# Patient Record
Sex: Male | Born: 1945 | Race: White | Hispanic: No | Marital: Married | Smoking: Never smoker
Health system: Southern US, Community
[De-identification: ages and names within clinical notes are randomized; demographics above are authoritative.]

## PROBLEM LIST (undated history)

## (undated) DIAGNOSIS — F341 Dysthymic disorder: Secondary | ICD-10-CM

## (undated) DIAGNOSIS — R7303 Prediabetes: Secondary | ICD-10-CM

## (undated) DIAGNOSIS — H548 Legal blindness, as defined in USA: Secondary | ICD-10-CM

## (undated) DIAGNOSIS — I10 Essential (primary) hypertension: Secondary | ICD-10-CM

## (undated) DIAGNOSIS — H5440 Blindness, one eye, unspecified eye: Secondary | ICD-10-CM

## (undated) DIAGNOSIS — Z86010 Personal history of colonic polyps: Secondary | ICD-10-CM

## (undated) DIAGNOSIS — I639 Cerebral infarction, unspecified: Secondary | ICD-10-CM

## (undated) DIAGNOSIS — M1990 Unspecified osteoarthritis, unspecified site: Secondary | ICD-10-CM

## (undated) DIAGNOSIS — E785 Hyperlipidemia, unspecified: Secondary | ICD-10-CM

## (undated) HISTORY — DX: Unspecified osteoarthritis, unspecified site: M19.90

## (undated) HISTORY — DX: Essential (primary) hypertension: I10

## (undated) HISTORY — DX: Hyperlipidemia, unspecified: E78.5

## (undated) HISTORY — DX: Blindness, one eye, unspecified eye: H54.40

## (undated) HISTORY — DX: Cerebral infarction, unspecified: I63.9

## (undated) HISTORY — DX: Personal history of colonic polyps: Z86.010

---

## 1986-01-28 HISTORY — PX: CORNEAL TRANSPLANT: SHX108

## 2005-02-20 ENCOUNTER — Inpatient Hospital Stay (HOSPITAL_COMMUNITY): Admission: RE | Admit: 2005-02-20 | Discharge: 2005-02-26 | Payer: Self-pay | Admitting: Orthopedic Surgery

## 2005-02-28 HISTORY — PX: REPLACEMENT TOTAL KNEE: SUR1224

## 2005-04-10 ENCOUNTER — Ambulatory Visit: Payer: Self-pay | Admitting: Family Medicine

## 2005-04-24 ENCOUNTER — Ambulatory Visit: Payer: Self-pay | Admitting: Family Medicine

## 2005-05-01 ENCOUNTER — Ambulatory Visit: Payer: Self-pay | Admitting: Gastroenterology

## 2005-05-06 ENCOUNTER — Ambulatory Visit: Payer: Self-pay | Admitting: Cardiology

## 2005-05-15 ENCOUNTER — Encounter (INDEPENDENT_AMBULATORY_CARE_PROVIDER_SITE_OTHER): Payer: Self-pay | Admitting: Specialist

## 2005-05-15 ENCOUNTER — Ambulatory Visit: Payer: Self-pay | Admitting: Gastroenterology

## 2005-05-15 DIAGNOSIS — Z860100 Personal history of colon polyps, unspecified: Secondary | ICD-10-CM

## 2005-05-15 DIAGNOSIS — D126 Benign neoplasm of colon, unspecified: Secondary | ICD-10-CM | POA: Insufficient documentation

## 2005-05-15 HISTORY — DX: Personal history of colonic polyps: Z86.010

## 2005-05-15 HISTORY — DX: Personal history of colon polyps, unspecified: Z86.0100

## 2005-05-17 ENCOUNTER — Ambulatory Visit: Payer: Self-pay | Admitting: Gastroenterology

## 2005-05-24 ENCOUNTER — Ambulatory Visit: Payer: Self-pay | Admitting: Gastroenterology

## 2005-05-24 ENCOUNTER — Encounter (INDEPENDENT_AMBULATORY_CARE_PROVIDER_SITE_OTHER): Payer: Self-pay | Admitting: *Deleted

## 2005-05-24 HISTORY — PX: ESOPHAGOGASTRODUODENOSCOPY: SHX1529

## 2005-06-14 ENCOUNTER — Ambulatory Visit: Payer: Self-pay | Admitting: Internal Medicine

## 2005-07-26 ENCOUNTER — Ambulatory Visit: Payer: Self-pay | Admitting: Internal Medicine

## 2005-11-13 ENCOUNTER — Ambulatory Visit: Payer: Self-pay | Admitting: Internal Medicine

## 2005-11-27 ENCOUNTER — Ambulatory Visit: Payer: Self-pay | Admitting: Family Medicine

## 2005-11-28 ENCOUNTER — Ambulatory Visit: Payer: Self-pay | Admitting: Family Medicine

## 2006-04-14 ENCOUNTER — Ambulatory Visit: Payer: Self-pay | Admitting: Internal Medicine

## 2006-05-12 ENCOUNTER — Encounter: Payer: Self-pay | Admitting: Family Medicine

## 2006-05-13 DIAGNOSIS — I10 Essential (primary) hypertension: Secondary | ICD-10-CM | POA: Insufficient documentation

## 2006-09-08 ENCOUNTER — Ambulatory Visit: Payer: Self-pay | Admitting: Gastroenterology

## 2006-12-17 ENCOUNTER — Telehealth: Payer: Self-pay | Admitting: Family Medicine

## 2006-12-31 ENCOUNTER — Ambulatory Visit: Payer: Self-pay | Admitting: Family Medicine

## 2007-05-01 ENCOUNTER — Ambulatory Visit: Payer: Self-pay | Admitting: Family Medicine

## 2007-05-01 LAB — CONVERTED CEMR LAB
AST: 17 units/L (ref 0–37)
Alkaline Phosphatase: 47 units/L (ref 39–117)
Calcium: 9.4 mg/dL (ref 8.4–10.5)
Chloride: 107 meq/L (ref 96–112)
GFR calc Af Amer: 97 mL/min
GFR calc non Af Amer: 80 mL/min
Glucose, Bld: 105 mg/dL — ABNORMAL HIGH (ref 70–99)
HDL: 39 mg/dL (ref >39.0)
LDL Cholesterol: 117 mg/dL — ABNORMAL HIGH (ref 0–99)
PSA: 2.25 ng/mL (ref 0.10–4.00)
Total Bilirubin: 1.3 mg/dL — ABNORMAL HIGH (ref 0.3–1.2)
Triglycerides: 124 mg/dL (ref 0–149)
VLDL: 25 mg/dL (ref 0–40)

## 2007-05-05 ENCOUNTER — Ambulatory Visit: Payer: Self-pay | Admitting: Family Medicine

## 2007-05-05 DIAGNOSIS — L259 Unspecified contact dermatitis, unspecified cause: Secondary | ICD-10-CM | POA: Insufficient documentation

## 2008-03-01 ENCOUNTER — Telehealth: Payer: Self-pay | Admitting: Family Medicine

## 2009-01-24 ENCOUNTER — Telehealth: Payer: Self-pay | Admitting: Family Medicine

## 2009-03-01 ENCOUNTER — Ambulatory Visit: Payer: Self-pay | Admitting: Family Medicine

## 2009-05-04 ENCOUNTER — Ambulatory Visit: Payer: Self-pay | Admitting: Family Medicine

## 2009-05-04 DIAGNOSIS — E7800 Pure hypercholesterolemia, unspecified: Secondary | ICD-10-CM | POA: Insufficient documentation

## 2009-05-04 LAB — CONVERTED CEMR LAB
ALT: 24 units/L (ref 0–53)
Albumin: 4.6 g/dL (ref 3.5–5.2)
CO2: 31 meq/L (ref 19–32)
Calcium: 9.6 mg/dL (ref 8.4–10.5)
Chloride: 102 meq/L (ref 96–112)
Cholesterol: 186 mg/dL (ref 0–200)
Glucose, Bld: 106 mg/dL — ABNORMAL HIGH (ref 70–99)
LDL Cholesterol: 124 mg/dL — ABNORMAL HIGH (ref 0–99)
Triglycerides: 91 mg/dL (ref 0.0–149.0)

## 2009-05-16 ENCOUNTER — Ambulatory Visit: Payer: Self-pay | Admitting: Family Medicine

## 2009-09-05 ENCOUNTER — Encounter (INDEPENDENT_AMBULATORY_CARE_PROVIDER_SITE_OTHER): Payer: Self-pay | Admitting: *Deleted

## 2010-02-27 NOTE — Assessment & Plan Note (Signed)
Summary: REFILL MEDICATION/CLE   Vital Signs:  Patient profile:   65 year old male Height:      73 inches Weight:      252 pounds BMI:     33.37 Temp:     98.6 degrees F oral Pulse rate:   76 / minute Pulse rhythm:   regular BP sitting:   122 / 78  (left arm) Cuff size:   large  Vitals Entered By: Sydell Axon LPN (March 01, 2009 8:51 AM) CC: Refill medications   History of Present Illness: Pt here for followup at my encouragement for Htn and Genella Rife. he has no complaints and feels well.  He "Gets exercise" playing golf, carries his bag from the car to the golfcart!  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 Take 1 By Mouth Once Daily 2)  Omeprazole 20 Mg  Cpdr (Omeprazole) .Marland Kitchen.. 1 Q D  Allergies: No Known Drug Allergies  Physical Exam  General:  Well-developed,well-nourished,in no acute distress; alert,appropriate and cooperative throughout examination. Head:  Normocephalic and atraumatic without obvious abnormalities. No apparent alopecia or balding. Sinuses NT. Eyes:  Conjunctiva clear bilaterally.  Ears:  External ear exam shows no significant lesions or deformities.  Otoscopic examination reveals clear canals, tympanic membranes are intact bilaterally without bulging, retraction, inflammation or discharge. Hearing is grossly normal bilaterally. Nose:  External nasal examination shows no deformity or inflammation. Nasal mucosa are pink and moist without lesions or exudates. Mouth:  Oral mucosa and oropharynx without lesions or exudates.  Teeth in good repair. Neck:  No deformities, masses, or tenderness noted. Chest Wall:  No  deformities, masses, tenderness or gynecomastia noted. Lungs:  Normal respiratory effort, chest expands symmetrically. Lungs are clear to auscultation, no crackles or wheezes. Heart:  Normal rate and regular rhythm. S1 and S2 normal without gallop, murmur, click, rub or other extra sounds.   Impression & Recommendations:  Problem # 1:  HYPERTENSION (ICD-401.9) Assessment Unchanged Stable. Cont Quinapril. His updated medication list for this problem includes:    Quinapril Hcl 40 Mg Tabs (Quinapril hcl) .Marland Kitchen... Take 1 by mouth once daily  BP today: 122/78 Prior BP: 120/80 (05/05/2007)  Labs Reviewed: K+: 4.3 (05/01/2007) Creat: : 1.0 (05/01/2007)   Chol: 181 (05/01/2007)   HDL: 39.0 (05/01/2007)   LDL: 117 (05/01/2007)   TG: 124 (05/01/2007)  Problem # 2:  HEARTBURN (ICD-787.1) Assessment: Unchanged Uses Omeprazole. Will explore this next time.  Complete Medication List: 1)  Quinapril Hcl 40 Mg Tabs (Quinapril hcl) .... Take 1 by mouth once daily 2)  Omeprazole 20 Mg Cpdr (Omeprazole) .... Take one by mouth daily  Patient Instructions: 1)  RTC for Comp Exam when able, labs prior. Prescriptions: OMEPRAZOLE 20 MG  CPDR (OMEPRAZOLE) Take one by mouth daily  #30 x 12   Entered by:   Sydell Axon LPN   Authorized by:   Shaune Leeks MD   Signed by:   Sydell Axon LPN on 40/98/1191   Method used:   Print then Give to Patient   RxID:   [id number] QUINAPRIL HCL 40 MG  TABS (QUINAPRIL HCL) take 1 by mouth once daily  #  30 x 12   Entered by:   Sydell Axon LPN   Authorized by:   Shaune Leeks MD   Signed by:   Sydell Axon LPN on 16/10/9602   Method used:   Print then Give to Patient   RxID:   [id number]   Current Allergies (reviewed today): No known allergies

## 2010-02-27 NOTE — Letter (Signed)
Summary: Nadara Eaton letter  [place] [facility]  [address] [place], [place] [address]   Phone: [phone]  Fax: [phone]       09/05/2009 MRN: [id number]  PRATHIK AMAN [address] [place] [place], [place]  [address]  Dear Mr. [facility] [place], and [facility] announce the retirement of Arta Silence, M.D., from full-time practice at the [facility] office effective July 27, 2009 and his plans of returning part-time.  It is important to Dr. Hetty Ely and to our practice that you understand that [facility] has seven physicians in our office for your health care needs.  We will continue to offer the same exceptional care that you have today.    Dr. Hetty Ely has spoken to many of you about his plans for retirement and returning part-time in the fall.   We will continue to work with you through the transition to schedule appointments for you in the office and meet the high standards that [facility] is committed to.   Again, it is with great pleasure that we share the news that Dr. Hetty Ely will return to [facility] in October of 2011 with a reduced schedule.    If you have any questions, or would like to request an appointment with one of our physicians, please call us at [phone] and press the option for Scheduling an appointment.  We take pleasure in providing you with excellent patient care and look forward to seeing you at your next office visit.  Our [facility] Physicians are:  Tillman Abide, M.D. Laurita Quint, M.D. Roxy Manns, M.D. Kerby Nora, M.D. Hannah Beat, M.D. Ruthe Mannan, M.D. We proudly welcomed Raechel Ache, M.D. and Eustaquio Boyden, M.D. to the practice in July/August 2011.  Sincerely,  [place] [facility]

## 2010-02-27 NOTE — Assessment & Plan Note (Signed)
Summary: CPX/CLE   Vital Signs:  Patient profile:   65 year old male Weight:      249 pounds Temp:     98.5 degrees F oral Pulse rate:   76 / minute Pulse rhythm:   regular BP sitting:   118 / 80  (left arm) Cuff size:   large  Vitals Entered By: Sydell Axon LPN (May 16, 2009 2:33 PM) CC: 30 Minute checkup, patient states that he has had a colonoscopy in the last 5 years   History of Present Illness: Pt here for Comp Exam. He has chronic knee problems and has had polyps, due colonoscopy again next year.  Preventive Screening-Counseling & Management  Alcohol-Tobacco     Alcohol drinks/day: 0     Smoking Status: never     Passive Smoke Exposure: no  Caffeine-Diet-Exercise     Caffeine use/day: 4     Does Patient Exercise: yes     Type of exercise: golf, fishing...walks a lot     Times/week: 4  Problems Prior to Update: 1)  Hypercholesterolemia  (ICD-272.0) 2)  Heartburn  (ICD-787.1) 3)  Lesion, Scalp  (ICD-238.2) 4)  Eczema  (ICD-692.9) 5)  Health Maintenance Exam  (ICD-V70.0) 6)  Special Screening Malignant Neoplasm of Prostate  (ICD-V76.44) 7)  Health Maintenance Exam  (ICD-V70.0) 8)  Colonic Polyps, Adenomatous  (ICD-211.3) 9)  Hypertension  (ICD-401.9)  Medications Prior to Update: 1)  Quinapril Hcl 40 Mg  Tabs (Quinapril Hcl) .... Take 1 By Mouth Once Daily 2)  Omeprazole 20 Mg  Cpdr (Omeprazole) .... Take One By Mouth Daily  Allergies: No Known Drug Allergies  Past History:  Past Medical History: Last updated: 05/12/2006 Hypertension  Family History: Last updated: 05/16/2009 Father dec Glaucoma Mother dec 84 Male Ca Brother dec 30 Brain condition Brother A 32 Sister A 68   Social History: Last updated: 05/12/2006 Occupation: Sports Psychologist Married Never Smoked Occas chews Alcohol use-no Drug use-no  Risk Factors: Alcohol Use: 0 (05/16/2009) Caffeine Use: 4 (05/16/2009) Exercise: yes (05/16/2009)  Risk Factors: Smoking Status:  never (05/16/2009) Passive Smoke Exposure: no (05/16/2009)  Past Surgical History: Total right knee- osteoarthritis 2/07 Corneal transplant L 1988 EGD- mild erythema in antrum 05/24/2005 Colonoscopy + polyps  05/15/2005    2012 CT ABdD/Pelvis nml 04/2005  Family History: Father dec Glaucoma Mother dec 84 Male Ca Brother dec 30 Brain condition Brother A 80 Sister A 68   Review of Systems General:  Denies chills, fatigue, fever, sweats, weakness, and weight loss. Eyes:  Complains of blurring; denies discharge and itching; h/o cornea transplant. ENT:  Denies decreased hearing, earache, and ringing in ears. CV:  Denies chest pain or discomfort, fainting, fatigue, palpitations, shortness of breath with exertion, swelling of feet, and swelling of hands. Resp:  Denies cough, shortness of breath, and wheezing. GI:  Denies abdominal pain, bloody stools, change in bowel habits, constipation, dark tarry stools, diarrhea, indigestion, loss of appetite, nausea, vomiting, vomiting blood, and yellowish skin color. GU:  Complains of nocturia; denies discharge, dysuria, and urinary frequency; once to twice. Derm:  Denies dryness, itching, and rash; sees dermatologist. Neuro:  Denies numbness, poor balance, tingling, and tremors.  Physical Exam  General:  Well-developed,well-nourished,in no acute distress; alert,appropriate and cooperative throughout examination. Head:  Normocephalic and atraumatic without obvious abnormalities. No apparent alopecia, mild male pattern  balding. Sinuses NT. Eyes:  Conjunctiva clear bilaterally.  Ears:  External ear exam shows no significant lesions or deformities.  Otoscopic examination reveals clear  canals, tympanic membranes are intact bilaterally without bulging, retraction, inflammation or discharge. Hearing is grossly normal bilaterally. Nose:  External nasal examination shows no deformity or inflammation. Nasal mucosa are pink and moist without lesions or  exudates. Mouth:  Oral mucosa and oropharynx without lesions or exudates.  Teeth in good repair. Neck:  No deformities, masses, or tenderness noted. Chest Wall:  No deformities, masses, tenderness or gynecomastia noted. Breasts:  No masses or gynecomastia noted Lungs:  Normal respiratory effort, chest expands symmetrically. Lungs are clear to auscultation, no crackles or wheezes. Heart:  Normal rate and regular rhythm. S1 and S2 normal without gallop, murmur, click, rub or other extra sounds. Abdomen:  Bowel sounds positive,abdomen soft and non-tender without masses, organomegaly or hernias noted. Rectal:  No external abnormalities noted. Normal sphincter tone. No rectal masses or tenderness. G neg. Genitalia:  Testes bilaterally descended without nodularity, tenderness or masses. No scrotal masses or lesions. No penis lesions or urethral discharge. Prostate:  Prostate gland firm and smooth, no enlargement, nodularity, tenderness, mass, asymmetry or induration. 30-40 gms. Msk:  No deformity or scoliosis noted of thoracic or lumbar spine.   Pulses:  R and L carotid,radial,femoral,dorsalis pedis and posterior tibial pulses are full and equal bilaterally Extremities:  No clubbing, cyanosis, edema, or deformity noted with normal full range of motion of all joints.   Neurologic:  No cranial nerve deficits noted. Station and gait are normal. Sensory, motor and coordinative functions appear intact. Skin:  Intact without suspicious lesions or rashes, some SKs and Aks of trunk and head. Cervical Nodes:  No lymphadenopathy noted Inguinal Nodes:  No significant adenopathy Psych:  Cognition and judgment appear intact. Alert and cooperative with normal attention span and concentration. No apparent delusions, illusions, hallucinations   Impression & Recommendations:  Problem # 1:  HEALTH MAINTENANCE EXAM (ICD-V70.0)  Problem # 2:  HYPERCHOLESTEROLEMIA (ICD-272.0)  Problem # 3:  ECZEMA  (ICD-692.9) Assessment: Unchanged  Stable...sees Derm.  Discussed avoidance of triggers and symptomatic treatment.   Problem # 4:  SPECIAL SCREENING MALIGNANT NEOPLASM OF PROSTATE (ICD-V76.44) Assessment: Unchanged Stable PSA and exam.  Problem # 5:  COLONIC POLYPS, ADENOMATOUS (ICD-211.3) Assessment: Unchanged Rectal exam nml and due colonoscpy next year.  Problem # 6:  HYPERTENSION (ICD-401.9) Assessment: Unchanged Stable, cont curr meds. His updated medication list for this problem includes:    Quinapril Hcl 40 Mg Tabs (Quinapril hcl) .Marland Kitchen... Take 1 by mouth once daily  BP today: 118/80 Prior BP: 122/78 (03/01/2009)  Labs Reviewed: K+: 4.4 (05/04/2009) Creat: : 0.9 (05/04/2009)   Chol: 186 (05/04/2009)   HDL: 43.70 (05/04/2009)   LDL: 124 (05/04/2009)   TG: 91.0 (05/04/2009)  Complete Medication List: 1)  Quinapril Hcl 40 Mg Tabs (Quinapril hcl) .... Take 1 by mouth once daily 2)  Omeprazole 20 Mg Cpdr (Omeprazole) .... Take one by mouth daily 3)  Tylenol Pm Extra Strength 500-25 Mg Tabs (Diphenhydramine-apap (sleep)) .... As needed  Patient Instructions: 1)  RTC one year or  as needed.  Current Allergies (reviewed today): No known allergies   Appended Document: CPX/CLE   Tetanus/Td Vaccine    Vaccine Type: Tdap    Site: left deltoid    Mfr: GlaxoSmithKline    Dose: 0.5 ml    Route: IM    Given by: Sydell Axon LPN    Exp. Date: 04/22/2011    Lot #: [id number]    VIS given: 12/16/06 version given May 16, 2009.   Appended Document: CPX/CLE Discussed immunizations, healthy  lifestyle, sleeping and eating concerns.

## 2010-06-12 NOTE — Assessment & Plan Note (Signed)
[facility]                         GASTROENTEROLOGY OFFICE NOTE   Evan Morales, NUTTALL                      MRN:          [id number]  DATE:09/08/2006                            DOB:          23-Nov-1945    GI FOLLOWUP NOTE   PRIMARY CARE PHYSICIAN:  Barbette Hair. Artist Pais, D.O.   GI PROBLEM LIST:  1. History of colonic tubular adenomas.  Small TA removed by      colonoscopy 2004, 2007, he is set up for a repeat colonoscopy for      2012.  2. Vague dyspeptic symptoms.  EGD April 2007, very mild erythema,      otherwise normal.  Biopsies for sprue were negative.  CT scan of      abdomen and pelvis was normal.  CLO-testing was negative.  Symptoms      improved.   INTERVAL HISTORY:  I last saw Drayce at the time of his upper and lower  endoscopies about a year ago.  Since then, he really has felt fine.  He  did not take any antacid medicines any more.  He has been gaining  weight.  He has put on probably 20-30 pounds over this time.  He came in  at the suggestion of his wife because she has noticed he tends to have  some regurgitation about once a month.  He does acknowledge this and  says it is almost always after a particularly large meal.  His eating  habits are a bit unusual.  He will eat almost no breakfast or lunch, and  will have a large meal for dinner and snack a lot while watching  sporting events at night.  He does not drink alcohol.  He is not a  smoker.  He has no dysphagia.  No vomiting blood.  He has been gaining  weight.   CURRENT MEDICATIONS:  Quinapril is his only medicine.   PHYSICAL EXAM:  Weight 240 pounds, which is up 28 pounds since his last  visit a year ago.  Blood pressure 126/84, pulse 80.  CONSTITUTIONAL:  Generally well-appearing.  Alert and oriented x3.   ASSESSMENT AND PLAN:  A 65 year old man with intermittent regurgitation.   He is not very concerned about these symptoms.  He says they happen once  every month or 2.  He  has not been losing weight.  It does sound like he  eats fairly unusually (i.e., 1 very large meal a day and he says even  then the symptoms will only happen when he eats even more than his usual  1 large meal a day).  I recommended that he try eating 2 to 3 smaller  meals a day, and if that did not help, try simethicone taking 2 Gas-X  pills with a large meal.  He is  already in our reminder system for colonoscopy again in 2012.  I see no  reason for repeat endoscopy at this point.     Rachael Fee, MD  Electronically Signed    DPJ/MedQ  DD: 09/08/2006  DT: 09/09/2006  Job #: [id number]   cc:  Barbette Hair. Artist Pais, DO

## 2010-06-15 NOTE — Op Note (Signed)
NAME:  XAIDYN, KEPNER NO.:  [id number]   MEDICAL RECORD NO.:  [id number]          PATIENT TYPE:  INP   LOCATION:  2852                         FACILITY:  [facility]   PHYSICIAN:  Nadara Mustard, MD     DATE OF BIRTH:  1945/11/16   DATE OF PROCEDURE:  02/20/2005  DATE OF DISCHARGE:                                 OPERATIVE REPORT   PREOP DIAGNOSIS:  Osteoarthritis right knee.   POSTOP DIAGNOSIS:  Osteoarthritis right knee.   PROCEDURE:  Right total knee arthroplasty with #5 femur, #4 tibia, 12.5-mm  poly tray with a 41-mm patella.   SURGEON:  Nadara Mustard, MD   ANESTHESIA:  General.   ESTIMATED BLOOD LOSS:  Minimal.   ANTIBIOTICS:  1 gram of Kefzol.   DRAINS:  None.   COMPLICATIONS:  None.   TOURNIQUET TIME:  60 minutes at 300 mmHg at the thigh.   DISPOSITION:  To PACU in stable condition. Posterior capsule injected with  total of 60 mL of quarter percent Marcaine with epinephrine.   INDICATIONS FOR PROCEDURE:  The patient is a 65 year old gentleman with  tricompartmental osteoarthritis of his right knee and has failed  conservative care.  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 The risks  and benefits were discussed including infection, neurovascular injury,  persistent pain, need for additional surgery. The patient states he  understands and wished proceed at this time.   DESCRIPTION OF PROCEDURE:  The patient was brought to OR room 4 and  underwent general anesthetic. After adequate level of anesthesia obtained,  the patient's right lower extremity was prepped using DuraPrep, draped in a  sterile field. Collier Flowers was used to cover all exposed skin. The knee was flexed  and the tourniquet inflated 300 mmHg. An incision was made dorsally over the  knee. This was carried down through a medial parapatellar retinacular  incision. The patella was everted. A canal starting hole was made  in the  femur and intermedullary guide was placed and this was set to take 11 mm off  the distal femur. This sized for a size 5 and the size 5 cutting block was  placed and the chamfer cuts were made for the size 5 femur. Attention was  then focused on the tibia, external alignment was used with neutral varus  valgus alignment as well as neutral posterior slope and this was set to take  10 mm off the medial tibial plateau. The cut was made and then the tibia was  sized for a size 4 tibia and this was sized and the keel cuts were made and  the trial component was left on the tibia. The box cut was then made for the  size 5 femur and the femoral and tibial components trial were placed. This  was sized with a 10 and a 12.5 mm 12.5 mm had good stability and had full  extension. The keel cuts were made for the femur. The trial components  removed. The patella was resurfaced with 10 mm  taken off the patella and the  peg cuts were made for a size 41 patella. The knee was irrigated with pulse  lavage. The cement was mixed and the tibial and then femoral components were  cemented in place.  Loose cement was removed. The patella was cemented in  place and loose cement was also removed.  The clamp was placed on the  patella. The 12.5-mm poly tray was placed and the knee was kept in extension  until the cement had hardened. The knee was again irrigated with pulse  lavage while the cement was hardening all loose cement was removed. After  the cement had hardened, the knee was placed through full range of motion  and had good stability. The patella tracked midline. The tourniquet was  released and hemostasis was obtained. The medial parapatellar retinacular  incision was closed using #1 Vicryl.  Subcu was closed using 2-0 Vicryl.  Skin was closed using Proximate staples. The wound was covered with Adaptic  orthopedic sponges, ABD dressing, Webril and Coban dressing. Triple ice pack  was applied. The  patient was extubated, taken to PACU in stable condition.      Nadara Mustard, MD  Electronically Signed     MVD/MEDQ  D:  02/20/2005  T:  02/20/2005  Job:  [id number]

## 2010-06-15 NOTE — Discharge Summary (Signed)
NAME:  Evan Morales, STEAGALL NO.:  [id number]   MEDICAL RECORD NO.:  [id number]          PATIENT TYPE:  INP   LOCATION:  5030                         FACILITY:  [facility]   PHYSICIAN:  Nadara Mustard, MD     DATE OF BIRTH:  1945-11-21   DATE OF ADMISSION:  02/20/2005  DATE OF DISCHARGE:  02/26/2005                                 DISCHARGE SUMMARY   DIAGNOSIS:  Osteoarthritis right knee.   PROCEDURE:  Right total knee arthroplasty.   Discharged to home in stable condition with home health physical therapy,  home health R.N., durable medical equipment, plan to follow-up in the office  in one week. The patient discharged with Coumadin for DVT prophylaxis.   HISTORY OF PRESENT ILLNESS:  The patient is a 65 year old gentleman with  bilateral osteoarthritis of his knees, right worse than left. The patient  has failed conservative care, pain with activities of daily living and  presents at this time for total knee arthroplasty on the right. The patient  underwent a right total knee arthroplasty on February 20, 2005 with DePuy  components with a #5 femur, #4 tibia, 12.5 poly tray with a 41 mm patella.  The patient received Kefzol for infection prophylaxis and the tourniquet  time was 60 minutes. Postoperatively, the patient was kept on Keflex for 24  hours and was started on Coumadin for DVT prophylaxis. The patient was seen  by physical therapy and progressed well with physical therapy. His  hemoglobin remained stable. The patient was felt to be safe for discharge to  home by physical therapy and was discharged to home in stable condition on  February 26, 2005 for follow up in the office one week after discharge.      Nadara Mustard, MD  Electronically Signed     MVD/MEDQ  D:  04/09/2005  T:  04/10/2005  Job:  [id number]

## 2010-06-15 NOTE — Consult Note (Signed)
NAME:  Evan Morales, Evan Morales NO.:  [id number]   MEDICAL RECORD NO.:  [id number]          PATIENT TYPE:  INP   LOCATION:  5030                         FACILITY:  [facility]   PHYSICIAN:  Evan Morales, M.D.       DATE OF BIRTH:  1945-09-24   DATE OF CONSULTATION:  02/22/2005  DATE OF DISCHARGE:                                   CONSULTATION   REFERRING PHYSICIAN:  Burnard Bunting, M.D.   PRIMARY CARE PHYSICIAN:  Jonita Albee, M.D., [facility].   REASON FOR CONSULTATION:  Abdominal distention, nausea, and vomiting.   HISTORY OF PRESENT ILLNESS:  Evan Morales is a 65 year old Caucasian male  with no significant past medical history who was admitted no February 20, 2005, and underwent a right total knee replacement. He had significant pain  postoperatively and he was on morphine PCA pump. Soon after the surgery he  noted significant distention of his abdomen with inability to pass gas,  nausea, and vomiting. He denies any frank abdominal pain. He reports that  today actually he was able to pass some gas.  He did not have any bowel  movements since she has been here in the hospital.   PAST MEDICAL HISTORY:  1.  Hypertension.  2.  Cornea transplant.   HOME MEDICATIONS:  Aspirin 81 mg daily.   MEDICATIONS HERE IN THE HOSPITAL:  1.  Vitamin C.  2.  Colace 100 mg b.i.d.  3.  Iron sulfate 325 mg t.i.d.  4.  Lisinopril 40 mg daily.  5.  Coumadin 7.5 mg for DVT prophylaxis.   The patient was also on a morphine PC pump and on multiple p.r.n.'s   SOCIAL HISTORY:  The patient does not drink alcohol, does not smoke  cigarettes. He is newlywed, married two months ago. He has two grownup  children.   FAMILY HISTORY:  Both parents are deceased. Mother died with cancer and  father died with congestive heart failure. The patient has two siblings who  are both healthy.   REVIEW OF SYSTEMS:  Negative for chest pain, negative for dyspnea. Positive  for some dry  cough.  All other systems as per HPI. Other systems are  negative.   PHYSICAL EXAMINATION:  VITAL SIGNS: Temperature 98, pulse 97, respirations  20, Morales pressure 154/85. Saturation of 94% on room air.  GENERAL: The patient is well-developed, well-nourished, mildly obese, in no  acute distress, alert and oriented to person,  place, and time.  HEENT: His head is normocephalic and atraumatic. Pupils equal, round, and  reactive to light and accommodation. Extraocular movements are intact.  Sclera anicteric. Conjunctiva pink. Throat clear. Mouth without ulcerations.  NECK: Supple without JVD or carotid bruits.  CHEST: Clear to auscultation bilaterally, without rhonchi, wheezes, or  crackles.  COR:  Regular rate and rhythm without murmurs, rubs, or gallops.  ABDOMEN: Slightly distended, nontender. There are coarse bowel sounds. There  are no palpable masses.  EXTREMITIES: The right lower extremity is wrapped in tight bandages after  surgery. The left lower extremity has no edema.  SKIN: Without any suspicious rashes.  NEUROLOGIC: Cranial nerves II-XII are intact. Strength is 5/5 in both upper  extremities, 5/5 in the left lower extremity.   LABORATORY VALUES:  Sodium 136, potassium 3.5, chloride 102, bicarbonate 26,  BUN 14, creatinine 0.9, glucose 134, calcium 9.  The patient's white Morales  cell count is 11.8 and stable over the course of two days. The patient's  hemoglobin is 12, stable over the course of two days. Platelet count is  166,000. PT 17.7, INR 1.4.   An EKG shows possible old inferior MI. Portable chest x-ray reveals no acute  disease. Abdominal x-ray shows some cecal distention and some diffuse  colonic distention, ileus pattern. Of note, this is a preliminary reading,  final report is still pending.   IMPRESSION/RECOMMENDATIONS:  1.  Ileus with more prominent cecal distention. 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 Also need to rule out obstruction,  although I do      not suspect that given the clinical presentation. I agree with      discontinuing the PCA morphine. Will try some Toradol p.r.n. pain. Also      will start Reglan intravenously around the clock. Also will aggressively      supplement the potassium even though 3.5 appears to be normal. The      patient might need a bit more to overcome this ileus. Will place a      rectal tube and a follow-up with abdominal x-ray tomorrow to assure that      the cecum is not further distending. If the patient's cecum distends      further he will need a GI consultation for colonic decompression. Of      note this patient never had a colonoscopy and I strongly advised him to      have a colonoscopy even if he gets better here in the hospital and does      not require one.  2.  Hypertension. I do agree this patient will need treatment at the time of      discharge. For now we will hold the ACE inhibitors since we do not want      to complicate the clinical picture with the development of the acute      renal insufficiency from the ACE.  3.  Postoperative anemia. The patient's hemoglobin is stable for now and      there are no signs of acute bleeding. I      agree with oral iron therapy and just check every three days.  4.  Gastrointestinal prophylaxis. We added Protonix.  5.  Deep venous thrombosis prophylaxis. We added Coumadin.      Evan Morales, M.D.  Electronically Signed     SL/MEDQ  D:  02/22/2005  T:  02/22/2005  Job:  [id number]   cc:   G. Dorene Grebe, M.D.  Fax: [phone]   Nadara Mustard, MD  Fax: [phone]   Jonita Albee, M.D.  Fax: [phone]

## 2010-06-16 ENCOUNTER — Encounter: Payer: Self-pay | Admitting: Family Medicine

## 2010-07-02 ENCOUNTER — Other Ambulatory Visit (INDEPENDENT_AMBULATORY_CARE_PROVIDER_SITE_OTHER): Payer: Medicare Other | Admitting: Family Medicine

## 2010-07-02 DIAGNOSIS — I10 Essential (primary) hypertension: Secondary | ICD-10-CM

## 2010-07-02 DIAGNOSIS — Z125 Encounter for screening for malignant neoplasm of prostate: Secondary | ICD-10-CM

## 2010-07-02 DIAGNOSIS — Z86010 Personal history of colonic polyps: Secondary | ICD-10-CM

## 2010-07-02 DIAGNOSIS — E7800 Pure hypercholesterolemia, unspecified: Secondary | ICD-10-CM

## 2010-07-02 LAB — BASIC METABOLIC PANEL
BUN: 11 mg/dL (ref 6–23)
CO2: 28 mEq/L (ref 19–32)
Calcium: 9.3 mg/dL (ref 8.4–10.5)
Chloride: 104 mEq/L (ref 96–112)
Creatinine, Ser: 0.9 mg/dL (ref 0.4–1.5)
GFR: 89.85 mL/min (ref >60.00)
Glucose, Bld: 109 mg/dL — ABNORMAL HIGH (ref 70–99)
Potassium: 4.8 mEq/L (ref 3.5–5.1)
Sodium: 138 mEq/L (ref 135–145)

## 2010-07-02 LAB — HEPATIC FUNCTION PANEL
ALT: 20 U/L (ref 0–53)
Alkaline Phosphatase: 51 U/L (ref 39–117)
Total Bilirubin: 0.9 mg/dL (ref 0.3–1.2)

## 2010-07-02 LAB — LIPID PANEL
HDL: 51.7 mg/dL (ref >39.00)
Total CHOL/HDL Ratio: 4
Triglycerides: 118 mg/dL (ref 0.0–149.0)
VLDL: 23.6 mg/dL (ref 0.0–40.0)

## 2010-07-02 LAB — CBC WITH DIFFERENTIAL/PLATELET
Eosinophils Relative: 2.5 % (ref 0.0–5.0)
Lymphocytes Relative: 31.9 % (ref 12.0–46.0)
MCHC: 34.5 g/dL (ref 30.0–36.0)
MCV: 96.5 fl (ref 78.0–100.0)
Monocytes Relative: 7.4 % (ref 3.0–12.0)
RDW: 13.3 % (ref 11.5–14.6)

## 2010-07-02 LAB — TSH: TSH: 1.51 u[IU]/mL (ref 0.35–5.50)

## 2010-07-05 ENCOUNTER — Ambulatory Visit (INDEPENDENT_AMBULATORY_CARE_PROVIDER_SITE_OTHER): Payer: Medicare Other | Admitting: Family Medicine

## 2010-07-05 ENCOUNTER — Encounter: Payer: Self-pay | Admitting: Family Medicine

## 2010-07-05 DIAGNOSIS — Z0000 Encounter for general adult medical examination without abnormal findings: Secondary | ICD-10-CM

## 2010-07-05 DIAGNOSIS — R7303 Prediabetes: Secondary | ICD-10-CM | POA: Insufficient documentation

## 2010-07-05 DIAGNOSIS — R7309 Other abnormal glucose: Secondary | ICD-10-CM

## 2010-07-05 DIAGNOSIS — I10 Essential (primary) hypertension: Secondary | ICD-10-CM

## 2010-07-05 DIAGNOSIS — R739 Hyperglycemia, unspecified: Secondary | ICD-10-CM

## 2010-07-05 DIAGNOSIS — E7800 Pure hypercholesterolemia, unspecified: Secondary | ICD-10-CM

## 2010-07-05 DIAGNOSIS — D126 Benign neoplasm of colon, unspecified: Secondary | ICD-10-CM

## 2010-07-05 MED ORDER — HYDROCHLOROTHIAZIDE 12.5 MG PO CAPS: 12.5000 mg | ORAL_CAPSULE | Freq: Every day | ORAL | Status: DC

## 2010-07-05 NOTE — Assessment & Plan Note (Signed)
High. Will add HCTZ to curr med and recheck in future. BP Readings from Last 3 Encounters:  07/05/10 150/84  05/16/09 118/80  03/01/09 122/78

## 2010-07-05 NOTE — Assessment & Plan Note (Signed)
Discussed avoiding sweets and carbs to avoid progression to diabetes. Also discussed regular exercise.

## 2010-07-05 NOTE — Assessment & Plan Note (Signed)
Adequate control. Cont curr diet. Lab Results  Component Value Date   CHOL 190 07/02/2010   CHOL 186 05/04/2009   CHOL 181 05/01/2007   Lab Results  Component Value Date   HDL 51.70 07/02/2010   HDL 43.70 05/04/2009   HDL 39.0 05/01/2007   Lab Results  Component Value Date   LDLCALC 115* 07/02/2010   LDLCALC 124* 05/04/2009   LDLCALC 117* 05/01/2007   Lab Results  Component Value Date   TRIG 118.0 07/02/2010   TRIG 91.0 05/04/2009   TRIG 124 05/01/2007   Lab Results  Component Value Date   CHOLHDL 4 07/02/2010   CHOLHDL 4 05/04/2009   CHOLHDL 4.6 CALC 05/01/2007   No results found for this basename: LDLDIRECT

## 2010-07-05 NOTE — Patient Instructions (Signed)
RTC 3 mos for BP check. Do iFob test.

## 2010-07-05 NOTE — Assessment & Plan Note (Signed)
Pt declines colonoscopy at the present time.  Will get iFOB and encourage him to get colonoscopy soon.

## 2010-07-05 NOTE — Progress Notes (Signed)
  Subjective:    Patient ID: Evan Morales, male    DOB: 1945/09/15, 65 y.o.   MRN: [id number]  HPI Pt here for Comp Exam as annually seen. HE is quite healthy and not seen often. He has no complaints and feels well. He is unaware of his pressure being elevated.    Review of Systems  Constitutional: Negative for fever, chills, diaphoresis, appetite change, fatigue and unexpected weight change.  HENT: Positive for tinnitus. Negative for hearing loss, ear pain and ear discharge.   Eyes: Positive for visual disturbance. Negative for pain and discharge.  Respiratory: Negative for cough, shortness of breath and wheezing.   Cardiovascular: Negative for chest pain and palpitations.       No SOB w/ exertion  Gastrointestinal: Positive for constipation (today). Negative for nausea, vomiting, abdominal pain, diarrhea and blood in stool.       No heartburn or swallowing problems.  Genitourinary: Negative for dysuria, frequency and difficulty urinating.       No nocturia  Musculoskeletal: Negative for myalgias, back pain and arthralgias.       H/O R knee repl, looking to have LTKR in the Fall.  Skin: Negative for rash.       No itching or dryness.  Neurological: Negative for tremors and numbness.       No tingling or balance problems.  Hematological: Negative for adenopathy. Does not bruise/bleed easily.  Psychiatric/Behavioral: Negative for dysphoric mood and agitation.       Objective:   Physical Exam  Constitutional: He is oriented to person, place, and time. He appears well-developed and well-nourished. No distress.  HENT:  Head: Normocephalic and atraumatic.  Right Ear: External ear normal.  Left Ear: External ear normal.  Nose: Nose normal.  Mouth/Throat: Oropharynx is clear and moist.  Eyes: Conjunctivae and EOM are normal. Pupils are equal, round, and reactive to light. Right eye exhibits no discharge. Left eye exhibits no discharge. No scleral icterus.  Neck: Normal range of  motion. Neck supple. No thyromegaly present.  Cardiovascular: Normal rate, regular rhythm, normal heart sounds and intact distal pulses.   No murmur heard. Pulmonary/Chest: Effort normal and breath sounds normal. No respiratory distress. He has no wheezes.  Abdominal: Soft. Bowel sounds are normal. He exhibits no distension and no mass. There is no tenderness. There is no rebound and no guarding.  Genitourinary: Rectum normal, prostate normal and penis normal. Guaiac negative stool.       Prostate 30gms, smooth and firm.  Musculoskeletal: Normal range of motion. He exhibits no edema.  Lymphadenopathy:    He has no cervical adenopathy.  Neurological: He is alert and oriented to person, place, and time. Coordination normal.  Skin: Skin is warm and dry. No rash noted. He is not diaphoretic.  Psychiatric: He has a normal mood and affect. His behavior is normal. Judgment and thought content normal.          Assessment & Plan:  HMPE    I have personally reviewed the Medicare Annual Wellness questionnaire and have noted 1. The patient's medical and social history 2. Their use of alcohol, tobacco or illicit drugs 3. Their current medications and supplements 4. The patient's functional ability including ADL's, fall risks, home safety risks and hearing or visual             impairment. 5. Diet and physical activities 6. Evidence for depression or mood disorders

## 2010-08-02 ENCOUNTER — Other Ambulatory Visit: Payer: Self-pay | Admitting: Family Medicine

## 2010-10-10 ENCOUNTER — Ambulatory Visit: Payer: Medicare Other | Admitting: Family Medicine

## 2010-11-21 ENCOUNTER — Telehealth: Payer: Self-pay | Admitting: Radiology

## 2010-11-21 NOTE — Telephone Encounter (Signed)
Noted  

## 2010-11-21 NOTE — Telephone Encounter (Signed)
[facility] notified us that this patient never returned the ifob stool kit. [facility] will bill them $5.27 for the kit. 

## 2011-01-02 ENCOUNTER — Ambulatory Visit (INDEPENDENT_AMBULATORY_CARE_PROVIDER_SITE_OTHER): Payer: Medicare Other

## 2011-01-02 DIAGNOSIS — Z23 Encounter for immunization: Secondary | ICD-10-CM

## 2011-01-29 HISTORY — PX: CATARACT EXTRACTION: SUR2

## 2011-06-04 ENCOUNTER — Encounter: Payer: Medicare Other | Admitting: Family Medicine

## 2011-06-29 ENCOUNTER — Other Ambulatory Visit: Payer: Self-pay | Admitting: Family Medicine

## 2011-06-29 DIAGNOSIS — I10 Essential (primary) hypertension: Secondary | ICD-10-CM

## 2011-06-29 DIAGNOSIS — Z125 Encounter for screening for malignant neoplasm of prostate: Secondary | ICD-10-CM

## 2011-06-29 DIAGNOSIS — E7800 Pure hypercholesterolemia, unspecified: Secondary | ICD-10-CM

## 2011-07-05 ENCOUNTER — Other Ambulatory Visit (INDEPENDENT_AMBULATORY_CARE_PROVIDER_SITE_OTHER): Payer: Medicare Other

## 2011-07-05 DIAGNOSIS — I10 Essential (primary) hypertension: Secondary | ICD-10-CM

## 2011-07-05 DIAGNOSIS — E7800 Pure hypercholesterolemia, unspecified: Secondary | ICD-10-CM

## 2011-07-05 DIAGNOSIS — Z125 Encounter for screening for malignant neoplasm of prostate: Secondary | ICD-10-CM

## 2011-07-05 LAB — BASIC METABOLIC PANEL WITH GFR
BUN: 15 mg/dL (ref 6–23)
CO2: 26 meq/L (ref 19–32)
Calcium: 9.5 mg/dL (ref 8.4–10.5)
Chloride: 104 meq/L (ref 96–112)
Creatinine, Ser: 0.8 mg/dL (ref 0.4–1.5)
GFR: 96.99 mL/min
Glucose, Bld: 99 mg/dL (ref 70–99)
Potassium: 4.2 meq/L (ref 3.5–5.1)
Sodium: 138 meq/L (ref 135–145)

## 2011-07-05 LAB — LIPID PANEL
Cholesterol: 198 mg/dL (ref 0–200)
Total CHOL/HDL Ratio: 4
VLDL: 33 mg/dL (ref 0.0–40.0)

## 2011-07-05 NOTE — Progress Notes (Signed)
Addended by: Baldomero Lamy on: 07/05/2011 08:08 AM   Modules accepted: Orders

## 2011-07-08 ENCOUNTER — Other Ambulatory Visit: Payer: Medicare Other

## 2011-07-10 ENCOUNTER — Ambulatory Visit (INDEPENDENT_AMBULATORY_CARE_PROVIDER_SITE_OTHER): Payer: MEDICARE | Admitting: Family Medicine

## 2011-07-10 ENCOUNTER — Encounter: Payer: Self-pay | Admitting: Family Medicine

## 2011-07-10 VITALS — BP 148/92 | HR 88 | Temp 98.2°F | Ht 73.5 in | Wt 251.8 lb

## 2011-07-10 DIAGNOSIS — Z1211 Encounter for screening for malignant neoplasm of colon: Secondary | ICD-10-CM

## 2011-07-10 DIAGNOSIS — E7800 Pure hypercholesterolemia, unspecified: Secondary | ICD-10-CM

## 2011-07-10 DIAGNOSIS — R739 Hyperglycemia, unspecified: Secondary | ICD-10-CM

## 2011-07-10 DIAGNOSIS — Z0000 Encounter for general adult medical examination without abnormal findings: Secondary | ICD-10-CM | POA: Insufficient documentation

## 2011-07-10 DIAGNOSIS — Z23 Encounter for immunization: Secondary | ICD-10-CM

## 2011-07-10 DIAGNOSIS — D126 Benign neoplasm of colon, unspecified: Secondary | ICD-10-CM

## 2011-07-10 DIAGNOSIS — I10 Essential (primary) hypertension: Secondary | ICD-10-CM

## 2011-07-10 DIAGNOSIS — R972 Elevated prostate specific antigen [PSA]: Secondary | ICD-10-CM

## 2011-07-10 DIAGNOSIS — R7309 Other abnormal glucose: Secondary | ICD-10-CM

## 2011-07-10 MED ORDER — HYDROCHLOROTHIAZIDE 12.5 MG PO CAPS: 12.5000 mg | ORAL_CAPSULE | Freq: Every day | ORAL | Status: DC

## 2011-07-10 NOTE — Progress Notes (Signed)
Subjective:    Patient ID: Evan Morales, male    DOB: 03-17-1945, 66 y.o.   MRN: [id number]  HPI CC: medicare wellness  Occasional cough at night time going on for 6 wks.  May have started as cold.  H/o GERD but no sxs currently, off omeprazole.    Ran out of HCTZ 2 mo ago, has only been taking quinapril.  Wonders if needs thiazide.  Caffeine: 2-3 cups coffee/day Lives with wife.  (grown children) Occupation: retired, was Restaurant manager, fast food Activity: golf, fishing Diet: good water, fruits/vegetables  Preventative: Colonoscopy 04/2005 - adenomatous polyp.  rec rpt 5 yrs.  Wants iFOB today. Prostate - always normal.  Nocturia x2 nightly. No trouble with stream. Some weakness noted. Tetanus 2011 Will get pneumovax today. Declines shingles shot currently.  H/o right poor vision 2/2 trauma as child.  Has had several left eye surgeries, pending more (cataract and corneal implant)  Denies falls, anhedonia, depression.  Medications and allergies reviewed and updated in chart.  Past histories reviewed and updated if relevant as below. Patient Active Problem List  Diagnosis  . COLONIC POLYPS, ADENOMATOUS  . HYPERCHOLESTEROLEMIA  . HYPERTENSION  . ECZEMA  . Hyperglycemia   Past Medical History  Diagnosis Date  . Hypertension   . History of CT scan of abdomen 04/2005    pelvis normal  . 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 Corneal transplant 1988    left  . Esophagogastroduodenoscopy 05/24/2005    mild erythema in antrum  . Cataract extraction 2013    left   History  Substance Use Topics  . Smoking status: Never Smoker   . Smokeless tobacco: Current User    Types: Chew  . Alcohol Use: No   Family History  Problem Relation Age of Onset  . Cancer Mother     male  . Glaucoma Father   . Alzheimer's disease Mother   . Coronary artery disease Neg Hx   . Stroke  Neg Hx   . Diabetes Neg Hx    No Known Allergies Current Outpatient Prescriptions on File Prior to Visit  Medication Sig Dispense Refill  . diphenhydramine-acetaminophen (TYLENOL PM EXTRA STRENGTH) 25-500 MG TABS Take 1 tablet by mouth at bedtime as needed.        . quinapril (ACCUPRIL) 40 MG tablet TAKE 1 TABLET EVERY DAY  90 tablet  3  . hydrochlorothiazide (MICROZIDE) 12.5 MG capsule Take 1 capsule (12.5 mg total) by mouth daily.  30 capsule  12    Review of Systems  Constitutional: Negative for fever, chills, activity change, appetite change, fatigue and unexpected weight change.  HENT: Negative for hearing loss and neck pain.   Eyes: Positive for visual disturbance (h/o this).  Respiratory: Negative for cough, chest tightness, shortness of breath and wheezing.   Cardiovascular: Negative for chest pain, palpitations and leg swelling.  Gastrointestinal: Negative for nausea, vomiting, abdominal pain, diarrhea, constipation, blood in stool and abdominal distention.  Genitourinary: Negative for hematuria and difficulty urinating.  Musculoskeletal: Negative for myalgias and arthralgias.  Skin: Negative for rash.  Neurological: Negative for dizziness, seizures, syncope and headaches.  Hematological: Does not bruise/bleed easily.  Psychiatric/Behavioral: Negative for dysphoric mood. The patient is not nervous/anxious.       Objective:   Physical Exam  Nursing note and vitals reviewed. Constitutional: He is oriented to person,  place, and time. He appears well-developed and well-nourished. No distress.  HENT:  Head: Normocephalic and atraumatic.  Right Ear: Hearing, tympanic membrane, external ear and ear canal normal.  Left Ear: Hearing, tympanic membrane, external ear and ear canal normal.  Nose: Nose normal.  Mouth/Throat: Uvula is midline, oropharynx is clear and moist and mucous membranes are normal. No oropharyngeal exudate, posterior oropharyngeal edema, posterior oropharyngeal  erythema or tonsillar abscesses.  Eyes: Conjunctivae and EOM are normal. Pupils are equal, round, and reactive to light. No scleral icterus.  Neck: Normal range of motion. Neck supple. No thyromegaly present.  Cardiovascular: Normal rate, regular rhythm, normal heart sounds and intact distal pulses.   No murmur heard. Pulses:      Radial pulses are 2+ on the right side, and 2+ on the left side.  Pulmonary/Chest: Effort normal and breath sounds normal. No respiratory distress. He has no wheezes. He has no rales.  Abdominal: Soft. Bowel sounds are normal. He exhibits no distension and no mass. There is no tenderness. There is no rebound and no guarding.  Genitourinary: Rectum normal and prostate normal. Rectal exam shows no external hemorrhoid, no internal hemorrhoid, no fissure, no mass, no tenderness and anal tone normal. Guaiac negative stool. Prostate is not enlarged (20gm) and not tender.  Musculoskeletal: Normal range of motion. He exhibits no edema.  Lymphadenopathy:    He has no cervical adenopathy.  Neurological: He is alert and oriented to person, place, and time.       CN grossly intact, station and gait intact  Skin: Skin is warm and dry. No rash noted.  Psychiatric: He has a normal mood and affect. His behavior is normal. Judgment and thought content normal.      Assessment & Plan:

## 2011-07-10 NOTE — Assessment & Plan Note (Signed)
Stable this year

## 2011-07-10 NOTE — Assessment & Plan Note (Signed)
I have personally reviewed the Medicare Annual Wellness questionnaire and have noted 1. The patient's medical and social history 2. Their use of alcohol, tobacco or illicit drugs 3. Their current medications and supplements 4. The patient's functional ability including ADL's, fall risks, home safety risks and hearing or visual impairment. 5. Diet and physical activity 6. Evidence for depression or mood disorders The patients weight, height, BMI have been recorded in the chart.  Hearing and vision has been addressed. I have made referrals, counseling and provided education to the patient based review of the above and I have provided the pt with a written personalized care plan for preventive services. See scanned questionairre.  Reviewed preventative protocols and updated unless pt declined. Discussed healthy diet/lifestyle. Pneumovax today. Will consider shingles. Discussed colon screening - will start with iFOB. DRE reassuring.  PSA - did increase ~1.5 points in 1 year, but still in normal range.  Given increased rate, will recheck in 6 mo.  Pt agrees with plan.

## 2011-07-10 NOTE — Patient Instructions (Addendum)
I'll send you home with stool kit today. Restart hydrochlorothiazide (water pill) for blood pressure as it's a bit high today.  Continue quinapril. Pneumonia shot today. Think about shingles shot. Pass by up front to schedule PSA in 6 months to check on slightly elevated number (although still in normal range) Good to meet you, call us with questions.

## 2011-07-10 NOTE — Assessment & Plan Note (Signed)
bp elevated.  Advised to restart HCTZ, sent in.

## 2011-07-10 NOTE — Assessment & Plan Note (Signed)
Mild off meds. 

## 2011-07-10 NOTE — Assessment & Plan Note (Signed)
Discussed colonoscopy in h/o adenomatous polyps, due last year. Pt would like to start with iFOB, understands possible precancerous etiology of polyps.

## 2011-08-07 ENCOUNTER — Other Ambulatory Visit: Payer: Self-pay | Admitting: Family Medicine

## 2011-08-08 ENCOUNTER — Telehealth: Payer: Self-pay

## 2011-08-08 NOTE — Telephone Encounter (Signed)
Pt left v/m billing question, called pt back gave Annabelle in billing # [phone] to pts wife.

## 2012-01-09 ENCOUNTER — Telehealth: Payer: Self-pay

## 2012-01-09 ENCOUNTER — Other Ambulatory Visit (INDEPENDENT_AMBULATORY_CARE_PROVIDER_SITE_OTHER): Payer: Medicare Other

## 2012-01-09 DIAGNOSIS — R972 Elevated prostate specific antigen [PSA]: Secondary | ICD-10-CM

## 2012-01-09 LAB — PSA: PSA: 2.16 ng/mL (ref 0.10–4.00)

## 2012-01-09 NOTE — Telephone Encounter (Signed)
Three weeks ago started on and off with hard stools.  No abdominal pain or fever.Pt has been eating more nuts. Pt wants to know if should try a stool softener.Pt was in lab this AM and picked up [facility] [place].Please advise.

## 2012-01-09 NOTE — Telephone Encounter (Signed)
Yes - also increase water intake. May try OTC colace or docusate once to twice daily.

## 2012-01-09 NOTE — Telephone Encounter (Signed)
Patient notified

## 2012-01-10 ENCOUNTER — Encounter: Payer: Self-pay | Admitting: *Deleted

## 2012-01-23 ENCOUNTER — Ambulatory Visit (INDEPENDENT_AMBULATORY_CARE_PROVIDER_SITE_OTHER): Payer: Medicare Other | Admitting: Family Medicine

## 2012-01-23 ENCOUNTER — Encounter: Payer: Self-pay | Admitting: Family Medicine

## 2012-01-23 VITALS — BP 130/90 | HR 64 | Temp 98.0°F | Wt 239.0 lb

## 2012-01-23 DIAGNOSIS — K6289 Other specified diseases of anus and rectum: Secondary | ICD-10-CM | POA: Insufficient documentation

## 2012-01-23 DIAGNOSIS — D126 Benign neoplasm of colon, unspecified: Secondary | ICD-10-CM

## 2012-01-23 LAB — POC HEMOCCULT BLD/STL (OFFICE/1-CARD/DIAGNOSTIC): Fecal Occult Blood, POC: POSITIVE

## 2012-01-23 MED ORDER — HYDROCORTISONE ACETATE 25 MG RE SUPP: 25.0000 mg | Freq: Two times a day (BID) | RECTAL | Status: DC

## 2012-01-23 NOTE — Patient Instructions (Signed)
I do think this is from internal hemorrhoids. Treat with anusol HC suppositories one twice daily for next few days. Our other goal is 1 soft stool a day so continue with stool softener, metamucil water and other fiber in diet. I do recommend setting up follow up colonsocopy as you're due.

## 2012-01-23 NOTE — Assessment & Plan Note (Addendum)
With some bleeding noted on exam today - anticipate due to int hemorrhoids, vs possible bowel wall irritation.  No evidence of anal fissure.  Treat with anusol HC suppositories for next several days. If not better with this, to call us, consider earlier referral to GI. Discussed needs to avoid constipation. Also recommended setting up rpt colonoscopy for h/o adenomatous polyp as due.

## 2012-01-23 NOTE — Progress Notes (Signed)
  Subjective:    Patient ID: GOR VESTAL, male    DOB: 05-17-1945, 66 y.o.   MRN: [id number]  HPI CC: rectal pain after BMs  8 wks ago had hard stool and subsequent blood around stool and with wiping.  Since then, bowels more irregular, and intermittently has deep rectal ache that comes on after BM.  Becoming more persistent after each BM.  No more blood in stool since initial episode.  Over last 2-3 wks has used colace, metamucil, more water, more fiber.  Less nuts recently.  Now more regular. Tried aleve, didn't help pain. No fevers/chills, abd pain, n/v.  No stool urgency.  No bowel incontinence.  No h/o prostatitis.  No urinary sxs  Endorsed weight loss 2/2 aversion to eating 2/2 pain.  Weight loss noted. Wt Readings from Last 3 Encounters:  01/23/12 239 lb (108.41 kg)  07/10/11 251 lb 12 oz (114.193 kg)  07/05/10 248 lb 4 oz (112.605 kg)    Colonoscopy 04/2005 - adenomatous polyp. Cecal lipoma, rec rpt 5 yrs (Dr. Christella Hartigan). Declined repeat this year at wellness visit (06/2011), sent home with iFOB but never returned.  Past Medical History  Diagnosis Date  . Hypertension   . History of CT scan of abdomen 04/2005    pelvis normal  . 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 Constitutional: He appears well-developed and well-nourished. No distress.  Abdominal: Soft. Bowel sounds are normal. He exhibits no distension and no mass. There is no tenderness. There is no rebound and no guarding.  Genitourinary: Rectal exam shows tenderness. Rectal exam shows no external hemorrhoid, no fissure, no mass and anal tone normal. Guaiac positive stool.       Digital rectal exam very tender at left rectal wall near anus, afterwards some bleeding noted.  Did not appreciate rectal fissure  Psychiatric: He has a normal mood and affect.       Assessment & Plan:

## 2012-01-24 ENCOUNTER — Telehealth: Payer: Self-pay | Admitting: Gastroenterology

## 2012-01-24 NOTE — Telephone Encounter (Signed)
Pt was given appt to see Amy Monica Becton 01/28/12 Shirlee Limerick to call pt  Pt has rectal pain and is due for a colon

## 2012-01-27 ENCOUNTER — Encounter: Payer: Self-pay | Admitting: *Deleted

## 2012-01-28 ENCOUNTER — Encounter: Payer: Self-pay | Admitting: Physician Assistant

## 2012-01-28 ENCOUNTER — Ambulatory Visit (INDEPENDENT_AMBULATORY_CARE_PROVIDER_SITE_OTHER): Payer: Medicare Other | Admitting: Physician Assistant

## 2012-01-28 VITALS — BP 130/80 | HR 72 | Ht 73.0 in | Wt 240.0 lb

## 2012-01-28 DIAGNOSIS — Z86010 Personal history of colonic polyps: Secondary | ICD-10-CM

## 2012-01-28 DIAGNOSIS — K6289 Other specified diseases of anus and rectum: Secondary | ICD-10-CM

## 2012-01-28 MED ORDER — HYDROCORTISONE ACETATE 25 MG RE SUPP: 25.0000 mg | Freq: Two times a day (BID) | RECTAL | Status: DC

## 2012-01-28 MED ORDER — HYDROCORTISONE ACE-PRAMOXINE 2.5-1 % EX CREA: TOPICAL_CREAM | CUTANEOUS | Status: DC

## 2012-01-28 NOTE — Patient Instructions (Addendum)
We sent refills on the Hydrocortisone suppositories and sent a prescription for Hydro[facility]ortisone cream to [facility]. Continue the stool softners daily. We made you an appointment with Dr. Christella Hartigan on 03-10-2012 at 8:45 AM.

## 2012-01-28 NOTE — Progress Notes (Signed)
Subjective:    Patient ID: Evan Morales, male    DOB: 17-Sep-1945, 66 y.o.   MRN: [id number]  HPI  Dmitriy is a pleasant 66 year old white male known previously to Dr. Christella Hartigan. He was last seen in 2007 when he had colonoscopy. He was found to have 3 colon polyps and a lipoma in the cecum which was biopsied. 2 of the polyps were hyperplastic and one was adenomatous and he was recommended for followup in 5 years. He comes in today I referred by his primary care physician with complaints of rectal pain. Patient relates that he is been having internal rectal pain for about 6 weeks and had gone to see Dr. Sharen Hones last week because of pain was persisting. He says he thinks the pain started after a hard bowel movement but he has not been having any ongoing problems with constipation, denies any abdominal pain, he has not had any rectal bleeding. He says he has been eating less of intentionally does his pain is usually worse after a bowel movement. He says the typical pattern is to have a bowel movement at about 20 minutes later starts having internal rectal pain which will then be present for 45 hours and then stops until he has not bowel movement. He was given Anusol-HC suppositories by Dr. Sharen Hones and says that these were helping however she was having some difficulty with them breaking. Dr. Sharen Hones exam states that he was very tender at the left rectal wall near the anus there was a small amount of bleeding noted no rectal fissure appreciated and stool was heme positive.    Review of Systems  Constitutional: Negative.   HENT: Negative.   Eyes: Negative.   Respiratory: Negative.   Cardiovascular: Negative.   Gastrointestinal: Positive for constipation and rectal pain.  Genitourinary: Negative.   Musculoskeletal: Negative.   Neurological: Negative.   Hematological: Negative.   Psychiatric/Behavioral: Negative.    Outpatient Prescriptions Prior to Visit  Medication Sig Dispense Refill  .  diphenhydramine-acetaminophen (TYLENOL PM EXTRA STRENGTH) 25-500 MG TABS Take 1 tablet by mouth at bedtime as needed.        . hydrochlorothiazide (MICROZIDE) 12.5 MG capsule Take 1 capsule (12.5 mg total) by mouth daily.  90 capsule  3  . quinapril (ACCUPRIL) 40 MG tablet TAKE 1 TABLET EVERY DAY  90 tablet  1  . [DISCONTINUED] hydrocortisone (ANUSOL-HC) 25 MG suppository Place 1 suppository (25 mg total) rectally 2 (two) times daily.  12 suppository  0   Last reviewed on 01/28/2012 12:17 PM by Sammuel Cooper, PA     No Known Allergies Patient Active Problem List  Diagnosis  . COLONIC POLYPS, ADENOMATOUS  . HYPERCHOLESTEROLEMIA  . HYPERTENSION  . ECZEMA  . Hyperglycemia  . Medicare annual wellness visit, initial  . Rectal pain   History  Substance Use Topics  . Smoking status: Never Smoker   . Smokeless tobacco: Former Neurosurgeon    Types: Chew  . Alcohol Use: Yes     Comment: occasional     Objective:   Physical Exam well-developed older WM, pleasant- blood pressure 130/80 pulse 72 height 6 foot 1 weight 240. HEENT; nontraumatic normocephalic EOMI PERRLA sclera anicteric,Neck; Supple no JVD, Cardiovascular; regular rate and rhythm with S1-S2 no murmur or gallop, Pulmonary; clear bilaterally, Abdomen; soft nontender nondistended bowel sounds are active there is no palpable mass or hepatosplenomegaly, Rectal ;exam not done, patient declined secondary to discomfort, Extremities; no clubbing, cyanosis, or edema skin warm and dry,  Psych; mood and affect normal and appropriate.     Assessment & Plan:    #80 66 year old male with 6 week history of internal rectal pain, no definite fissure noted on recent rectal exam, pain likely secondary to persistent small anal fissure versus internal hemorrhoid, cannot rule out lesion. Patient reluctant to have rectal examination office today do to discomfort, and this was just done last week per his primary care physician. #2 history of adenomatous and  hyperplastic colon polyps due for followup colonoscopy  Plan; refill Anusol-HC suppositories and have asked him to use these at bedtime over the next 2 weeks Start Anamantle 2.5% patient to apply after a bowel movement and then 3-4 times daily as needed over the next 3-4 weeks Continue stool softener daily Plan office followup with Dr. Christella Hartigan or myself in 3-4 weeks and at that time we'll reassess and schedule for colonoscopy . We discussed colonoscopy today but he does not want to proceed with this until his rectal pain has improved.

## 2012-01-31 NOTE — Progress Notes (Signed)
I agree with the plan oultined in this note

## 2012-02-02 ENCOUNTER — Other Ambulatory Visit: Payer: Self-pay | Admitting: Family Medicine

## 2012-03-10 ENCOUNTER — Ambulatory Visit: Payer: Medicare Other | Admitting: Gastroenterology

## 2012-03-27 ENCOUNTER — Telehealth: Payer: Self-pay

## 2012-03-27 ENCOUNTER — Other Ambulatory Visit: Payer: Self-pay | Admitting: Family Medicine

## 2012-03-27 ENCOUNTER — Ambulatory Visit (INDEPENDENT_AMBULATORY_CARE_PROVIDER_SITE_OTHER)
Admission: RE | Admit: 2012-03-27 | Discharge: 2012-03-27 | Disposition: A | Discharge status: Home / Self Care | Payer: Medicare Other | Source: Ambulatory Visit | Attending: Family Medicine | Admitting: Family Medicine

## 2012-03-27 ENCOUNTER — Encounter: Payer: Self-pay | Admitting: Family Medicine

## 2012-03-27 ENCOUNTER — Ambulatory Visit (INDEPENDENT_AMBULATORY_CARE_PROVIDER_SITE_OTHER): Payer: Medicare Other | Admitting: Family Medicine

## 2012-03-27 VITALS — BP 134/84 | HR 72 | Temp 98.2°F | Wt 239.2 lb

## 2012-03-27 DIAGNOSIS — R209 Unspecified disturbances of skin sensation: Secondary | ICD-10-CM

## 2012-03-27 DIAGNOSIS — Z8673 Personal history of transient ischemic attack (TIA), and cerebral infarction without residual deficits: Secondary | ICD-10-CM | POA: Insufficient documentation

## 2012-03-27 DIAGNOSIS — I639 Cerebral infarction, unspecified: Secondary | ICD-10-CM

## 2012-03-27 DIAGNOSIS — I6930 Unspecified sequelae of cerebral infarction: Secondary | ICD-10-CM | POA: Insufficient documentation

## 2012-03-27 HISTORY — DX: Cerebral infarction, unspecified: I63.9

## 2012-03-27 LAB — COMPREHENSIVE METABOLIC PANEL
ALT: 47 U/L (ref 0–53)
AST: 31 U/L (ref 0–37)
Albumin: 4.2 g/dL (ref 3.5–5.2)
BUN: 11 mg/dL (ref 6–23)
Calcium: 9.5 mg/dL (ref 8.4–10.5)
Chloride: 103 mEq/L (ref 96–112)
Potassium: 4.3 mEq/L (ref 3.5–5.1)
Sodium: 139 mEq/L (ref 135–145)
Total Protein: 7.5 g/dL (ref 6.0–8.3)

## 2012-03-27 LAB — CBC WITH DIFFERENTIAL/PLATELET
Basophils Absolute: 0 10*3/uL (ref 0.0–0.1)
Eosinophils Absolute: 0.1 10*3/uL (ref 0.0–0.7)
Lymphs Abs: 1.5 10*3/uL (ref 0.7–4.0)
MCHC: 34.2 g/dL (ref 30.0–36.0)
MCV: 96.5 fl (ref 78.0–100.0)
Monocytes Absolute: 0.6 10*3/uL (ref 0.1–1.0)
Neutrophils Relative %: 71.3 % (ref 43.0–77.0)
Platelets: 237 10*3/uL (ref 150.0–400.0)
RDW: 13.2 % (ref 11.5–14.6)
WBC: 7.8 10*3/uL (ref 4.5–10.5)

## 2012-03-27 LAB — VITAMIN B12: Vitamin B-12: 254 pg/mL (ref 211–911)

## 2012-03-27 MED ORDER — ASPIRIN-DIPYRIDAMOLE ER 25-200 MG PO CP12: 1.0000 | ORAL_CAPSULE | Freq: Two times a day (BID) | ORAL | Status: DC

## 2012-03-27 MED ORDER — HYDROCORTISONE ACE-PRAMOXINE 2.5-1 % EX CREA: TOPICAL_CREAM | CUTANEOUS | Status: DC

## 2012-03-27 MED ORDER — ATORVASTATIN CALCIUM 40 MG PO TABS: 40.0000 mg | ORAL_TABLET | Freq: Every day | ORAL | Status: DC

## 2012-03-27 NOTE — Patient Instructions (Signed)
Blood work today. Pass by [facility]'s office to schedule CT scan for today, and carotid ultrasound for next week. If any one sided weakness, or any confusion or slurred speech, please go to ER to be evaluated.

## 2012-03-27 NOTE — Assessment & Plan Note (Signed)
Right sided paresthesias that started yesterday, resolved today. Concern for TIA - check head CT and carotid US. Check blood work including vit B12 and folate. If persistent or worsening to ER for eval. Pt agrees with plan. I advised him to f/u with [facility] re recent vision changes.

## 2012-03-27 NOTE — Telephone Encounter (Signed)
Seen today. 

## 2012-03-27 NOTE — Telephone Encounter (Signed)
On 03/26/12 numbness in rt side of face,arm and leg noticed; no weakness in rt side noticed but when goes to grasp something it is not where pt thinks it is;problem with vision. Pt said balance is not good but is not really dizzy. No h/a. Pt to see Dr Reece Agar today at 11:15 am and if condition changes or worsens prior to appt pt will call back.
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
## 2012-03-30 ENCOUNTER — Ambulatory Visit: Payer: Medicare Other | Admitting: Family Medicine

## 2012-03-30 ENCOUNTER — Telehealth: Payer: Self-pay | Admitting: *Deleted

## 2012-03-30 NOTE — Telephone Encounter (Signed)
FYI Wife calls states that husband has gotten worse, his seems more confused and r sided weakness is getting worse.I advised [facility] for evaluation asap. Per wife she will take him now, canceled appt they had scheduled for 2:30.

## 2012-03-30 NOTE — Telephone Encounter (Signed)
Placed on schedule at 1215. 1200 appt was already filled.

## 2012-03-30 NOTE — Telephone Encounter (Signed)
Noted.  Spoke with Evan Morales.  Endorses continued numbness of right chin/face but he and wife deny any confusion, slurred speech, weakness. Will see him tomorrow for f/u, also discussed need to call 911 if any worsening. Kim, can we put him on our schedule at 12:00 tomorrow?

## 2012-03-31 ENCOUNTER — Ambulatory Visit (INDEPENDENT_AMBULATORY_CARE_PROVIDER_SITE_OTHER): Payer: Medicare Other | Admitting: Family Medicine

## 2012-03-31 ENCOUNTER — Encounter: Payer: Self-pay | Admitting: Family Medicine

## 2012-03-31 VITALS — BP 118/72 | HR 76 | Temp 98.2°F | Wt 232.2 lb

## 2012-03-31 DIAGNOSIS — I6350 Cerebral infarction due to unspecified occlusion or stenosis of unspecified cerebral artery: Secondary | ICD-10-CM

## 2012-03-31 NOTE — Progress Notes (Signed)
Subjective:    Patient ID: Evan Morales, male    DOB: 24-Jul-1945, 67 y.o.   MRN: [id number]  HPI CC: f/u CVA  Presents with wife today.  Seen here 03/27/2012 with 2 wk h/o progressively worsening vision changes as well as 1 d h/o R sided paresthesias of face and upper arm.  Presented >8 hours after symptom onset. CT head without contrast showed acute vs subacute L occipital infarct without hemorrhage. Started on lipitor 40mg  daily. Prior on aspirin 325mg  daily - this was changed to aggrenox bid.  H/o chronic vision loss, upcoming f/u this week with ophthalmologist at [facility]. Pending carotid US and echo - scheduled for next week.  Persistent numbness periorally R side as well as numbness in R hand.   No appetite, not eating much.  Only taking boost and ice cream.  No solid food since last Friday. Wt Readings from Last 3 Encounters:  03/31/12 232 lb 4 oz (105.348 kg)  03/27/12 239 lb 4 oz (108.523 kg)  01/28/12 240 lb (108.863 kg)  Denies dizziness, imbalance.  No trouble swallowing.  No unilateral weakness.  Does not have blood pressure cuff at home so unable to keep track of #s. BP Readings from Last 3 Encounters:  03/31/12 118/72  03/27/12 134/84  01/28/12 130/80    Noticing trouble spelling words, increased memory loss over last 2 wks Wednesday night prior to previous appt - gave a talk at church, church members noted he was forgetting some things that he normally had no trouble with.  Also noticed trouble at golf range the day after. Feeling more disconnected  Denies depression.   CT HEAD WITHOUT CONTRAST  Technique: Contiguous axial images were obtained from the base of the skull through the vertex without contrast.  Comparison: None.  Findings: There is abnormal low density in the left occipital region suggesting subacute infarction. No evidence of hemorrhage. There are mild chronic appearing small vessel changes within the hemispheric white matter. The remainder the  brain appears normal. No mass lesion, hydrocephalus or extra-axial collection. No calvarial abnormality . No inflammatory sinus disease.  IMPRESSION:  Low density in the left occipital lobe most consistent with acute or subacute infarction. No evidence of hemorrhage.  Original Report Authenticated By: Paulina Fusi, M.D.   Past Medical History  Diagnosis Date  . Hypertension   . History of CT scan of abdomen 04/2005    pelvis normal  . 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 Benign neoplasm of colon 05/15/2005    Tubular adenomas polyps  . Colon polyp 2007  . CVA (cerebral vascular accident) 03/27/2012    02/2012: Low density in the left occipital lobe most consistent with acute or subacute infarction. No evidence of hemorrhage.      Review of Systems Per HPI    Objective:   Physical Exam  Nursing note and vitals reviewed. Constitutional: He is oriented to person, place, and time. He appears well-developed and well-nourished. No distress.  HENT:  Head: Normocephalic and atraumatic.  Mouth/Throat: Oropharynx is clear and moist. No oropharyngeal exudate.  Eyes: Conjunctivae and EOM are normal. No scleral icterus.  L lens implant. Chronic R opacity in lens Pupils reactive  Neck: Normal range of motion. Neck supple. Carotid bruit is not present.  Cardiovascular: Normal rate, regular rhythm, normal heart sounds and intact distal pulses.   No murmur heard. Pulmonary/Chest: Breath sounds normal. No respiratory distress. He has no wheezes. He has no rales.  Musculoskeletal:  He exhibits no edema.  Neurological: He is alert and oriented to person, place, and time. He has normal strength. No cranial nerve deficit or sensory deficit. He exhibits normal muscle tone. He displays a negative Romberg sign. Coordination abnormal. Gait normal.  CN 2-12 intact Difficulty with FTN 2/2 chronic vision loss Neg pronator drift Sensation to temperature and light touch intact  Skin: Skin is warm and dry.  No rash noted.  Psychiatric: He has a normal mood and affect.       Assessment & Plan:

## 2012-03-31 NOTE — Patient Instructions (Addendum)
Return to see me next week for follow up. Keep appointment for carotid ultrasound - we'll see if we can bring that forward.   We will schedule appointment for ultrasound Pass by [facility]'s office to schedule appointment with neurologist to help follow stroke. Good to see you today, hang in there.

## 2012-03-31 NOTE — Assessment & Plan Note (Signed)
With visual loss and mild persistent paresthesias - anticipate posterior circulation stroke. Awaiting carotid US and echocardiogram.  If unevealing, will obtain MRA neck to eval vertebral arteries.  Will also refer to neurology. Compliant with aggrenox and atorvastatin 40mg  daily. BP well controlled. Anorexia - likely due to CVA, but also discussed benefits of aggressive treatment of mood issues stemming from stroke.  Pt declines antidepressant currently, desires to readdress at next visit.

## 2012-04-02 ENCOUNTER — Other Ambulatory Visit: Payer: Medicare Other

## 2012-04-06 ENCOUNTER — Encounter (INDEPENDENT_AMBULATORY_CARE_PROVIDER_SITE_OTHER): Payer: Medicare Other

## 2012-04-06 DIAGNOSIS — H53129 Transient visual loss, unspecified eye: Secondary | ICD-10-CM

## 2012-04-06 DIAGNOSIS — I6523 Occlusion and stenosis of bilateral carotid arteries: Secondary | ICD-10-CM | POA: Insufficient documentation

## 2012-04-06 DIAGNOSIS — R209 Unspecified disturbances of skin sensation: Secondary | ICD-10-CM

## 2012-04-08 ENCOUNTER — Ambulatory Visit (HOSPITAL_COMMUNITY): Payer: Medicare Other | Attending: Family Medicine | Admitting: Radiology

## 2012-04-08 DIAGNOSIS — I079 Rheumatic tricuspid valve disease, unspecified: Secondary | ICD-10-CM | POA: Insufficient documentation

## 2012-04-08 DIAGNOSIS — I10 Essential (primary) hypertension: Secondary | ICD-10-CM | POA: Insufficient documentation

## 2012-04-08 NOTE — Progress Notes (Signed)
Echocardiogram performed.  

## 2012-04-09 ENCOUNTER — Other Ambulatory Visit: Payer: Self-pay | Admitting: Neurology

## 2012-04-09 ENCOUNTER — Ambulatory Visit (INDEPENDENT_AMBULATORY_CARE_PROVIDER_SITE_OTHER): Payer: Medicare Other | Admitting: Family Medicine

## 2012-04-09 ENCOUNTER — Encounter: Payer: Self-pay | Admitting: Family Medicine

## 2012-04-09 VITALS — BP 126/82 | HR 72 | Temp 98.3°F

## 2012-04-09 DIAGNOSIS — I639 Cerebral infarction, unspecified: Secondary | ICD-10-CM

## 2012-04-09 DIAGNOSIS — K5900 Constipation, unspecified: Secondary | ICD-10-CM

## 2012-04-09 DIAGNOSIS — D126 Benign neoplasm of colon, unspecified: Secondary | ICD-10-CM

## 2012-04-09 MED ORDER — POLYETHYLENE GLYCOL 3350 17 GM/SCOOP PO POWD: ORAL | Status: DC

## 2012-04-09 NOTE — Patient Instructions (Addendum)
Stop aggrenox (aspirin dipiridamole).  Continue apirin 81mg  daily (only one blood thinner daily). May try miralax as needed for constipation. Ask Dr. Anne Hahn about improving memory after stroke.  Start taking vitamin B12 daily for memory. Good to see you today, call us with questions. Advanced directive packet provided today

## 2012-04-09 NOTE — Progress Notes (Signed)
  Subjective:    Patient ID: Evan Morales, male    DOB: 04/17/1945, 67 y.o.   MRN: [id number]  HPI CC: f/u CVA  Questions about living will - desires to establish advanced directive.  Does not want prolonged life support.  If doesn't have capacity to make decisions would not want artificial prolongation of life.  Wants [facility].  Constipation - for months.  Going once to twice daily.  Has tried OTC laxatives.  Drinks a lot of water.  No hard stools.  No abd pain or nausea.  Decreased PO intake 2/2 decreased appetite after CVA.  Due for colonoscopy -  Had positive iFOB.  Already scheduled.  S/p L occipital lobe CVA 02/2012 - echocardiogram WNL except for mild LVH.  Carotid US with minimal plaque buildup bilaterally - 0-39%.  Pending MRI head/MRA neck per neurology . Saw Dr. Anne Hahn on Monday.  To f/u in 6 mo.  Noted was not an aspirin failure as he wasn't regularly taking prior to stroke.  Wife notices he's having more trouble with memory recently.  Pt denies depression.  Minimalist, doesn't want extensive workup.   Past Medical History  Diagnosis Date  . Hypertension   . History of CT scan of abdomen 04/2005    pelvis normal  . 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 Benign neoplasm of colon 05/15/2005    Tubular adenomas polyps  . Colon polyp 2007  . CVA (cerebral vascular accident) 03/27/2012    left occipital lobe - acute or subacute infarction. No hemorrhage.     Review of Systems Per HPI    Objective:   Physical Exam  Nursing note and vitals reviewed. Constitutional: He appears well-developed and well-nourished. No distress.  HENT:  Mouth/Throat: Uvula is midline and oropharynx is clear and moist. No oropharyngeal exudate.  Neck: Normal range of motion. Neck supple. Carotid bruit is not present.  Cardiovascular: Normal rate, regular rhythm, normal heart sounds and intact distal pulses.   No murmur heard. Pulmonary/Chest: Effort normal and breath  sounds normal. No respiratory distress. He has no wheezes. He has no rales.       Assessment & Plan:

## 2012-04-09 NOTE — Assessment & Plan Note (Signed)
Posterior circ stroke, but carotid US and echocardiogram have not uncovered etiology of CVA. Pending MRI/MRA by neurology Noticing worsening memory - recommended start B12.  ?aricept. Changed aggrenox to aspirin 81mg  daily. Mood stable per patient.  Anorexia improving.

## 2012-04-09 NOTE — Assessment & Plan Note (Signed)
?  stroke related vs due to decreased oral intake. Recommended start miralax as needed for this.  Expect improvement with improved intake.

## 2012-04-09 NOTE — Assessment & Plan Note (Signed)
Has colonoscopy scheduled.  Positive iFOB recently.

## 2012-04-10 ENCOUNTER — Telehealth: Payer: Self-pay

## 2012-04-10 NOTE — Telephone Encounter (Signed)
Discussed concerns with wife - discussed f/u with neurologist. Discussed option of namenda - but would like to give more time first. To call me in 1-2 wks if persistent trouble.

## 2012-04-10 NOTE — Telephone Encounter (Signed)
Pt's wife request referral to doctor that specilizes in stroke victims; pt's vision has been severly affected; memory problem is concerning and pt wants to know what caused the stroke. Please advise.

## 2012-04-14 ENCOUNTER — Ambulatory Visit: Payer: Medicare Other

## 2012-04-14 ENCOUNTER — Other Ambulatory Visit: Payer: Medicare Other

## 2012-04-15 ENCOUNTER — Other Ambulatory Visit: Payer: Self-pay | Admitting: Neurology

## 2012-04-15 DIAGNOSIS — H5310 Unspecified subjective visual disturbances: Secondary | ICD-10-CM

## 2012-04-22 ENCOUNTER — Ambulatory Visit
Admission: RE | Admit: 2012-04-22 | Discharge: 2012-04-22 | Disposition: A | Discharge status: Home / Self Care | Payer: Medicare Other | Source: Ambulatory Visit | Attending: Neurology | Admitting: Neurology

## 2012-04-22 DIAGNOSIS — H5310 Unspecified subjective visual disturbances: Secondary | ICD-10-CM

## 2012-04-22 DIAGNOSIS — I63239 Cerebral infarction due to unspecified occlusion or stenosis of unspecified carotid arteries: Secondary | ICD-10-CM

## 2012-04-23 ENCOUNTER — Telehealth: Payer: Self-pay | Admitting: Neurology

## 2012-04-23 NOTE — Telephone Encounter (Signed)
I called patient. 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 There is also some stenosis of the left middle cerebral artery. The cause of the stroke is likely related to intrinsic atherosclerotic changes. The 2-D echocardiogram was unremarkable. The carotid Doppler study is pending. The patient is remain on low-dose aspirin for now.

## 2012-04-24 ENCOUNTER — Encounter: Payer: Self-pay | Admitting: Gastroenterology

## 2012-04-24 ENCOUNTER — Ambulatory Visit (INDEPENDENT_AMBULATORY_CARE_PROVIDER_SITE_OTHER): Payer: Medicare Other | Admitting: Gastroenterology

## 2012-04-24 VITALS — BP 104/72 | HR 60 | Ht 73.0 in | Wt 222.8 lb

## 2012-04-24 DIAGNOSIS — K6289 Other specified diseases of anus and rectum: Secondary | ICD-10-CM

## 2012-04-24 NOTE — Progress Notes (Signed)
Review of pertinent gastrointestinal problems: 1. Adenomatous colon polyp: 2007 colonoscopy. He was found to have 3 colon polyps and a lipoma in the cecum which was biopsied. 2 of the polyps were hyperplastic and one was adenomatous and he was recommended for followup in 5 years. 2. Rectal pain 03/2012; given topical ointments  HPI: This is a   very pleasant 67 year old man whom I last saw several years ago. He saw Amy here in the office about 3 months ago. That was for rectal pain, see that direction summarized above. Since then he has had a stroke. This has been evaluated by neurology and he was put on baby aspirin once a day. He still has memory difficulties and problems with vision in one eye. The stroke was about 3-4 weeks ago.   Anorectal pain is much improved.  Still using   Had CVA 4 weeks ago, signficant memory issues now.  Cannot drive anymore.    Is on 81mg  ASA now due to the CVA.   Past Medical History  Diagnosis Date  . Hypertension   . History of CT scan of abdomen 04/2005    pelvis normal  . 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  . Benign neoplasm of colon 05/15/2005    tubular adenomas polyps  . Colon polyp 2007  . CVA (cerebral vascular accident) 03/27/2012    left occipital lobe - acute or subacute infarction. No hemorrhage.     Past Surgical History  Procedure Laterality Date  . 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 Esophagogastroduodenoscopy  05/24/2005    mild erythema in antrum  . Cataract extraction  2013    left    Current Outpatient Prescriptions  Medication Sig Dispense Refill  . atorvastatin (LIPITOR) 40 MG tablet Take 1 tablet (40 mg total) by mouth daily.  90 tablet  3  . cyanocobalamin 500 MCG tablet Take 500 mcg by mouth daily. Vitamin b12      . diphenhydramine-acetaminophen (TYLENOL PM EXTRA STRENGTH) 25-500 MG TABS Take 1 tablet by mouth at bedtime as needed.         . hydrochlorothiazide (MICROZIDE) 12.5 MG capsule Take 1 capsule (12.5 mg total) by mouth daily.  90 capsule  3  . hydrocortisone (ANUSOL-HC) 25 MG suppository Place 1 suppository (25 mg total) rectally 2 (two) times daily.  24 suppository  0  . polyethylene glycol powder (GLYCOLAX/MIRALAX) powder Take 1/2 to a whole capful mixed in 8 oz fluid as needed for constipation, hold for diarrhea.  3350 g  1  . Pramoxine-HC (HYDROCORTISONE ACE-PRAMOXINE) 2.5-1 % CREA Use rectally 3-4 times daily during the day.  1 Tube  1  . quinapril (ACCUPRIL) 40 MG tablet TAKE 1 TABLET EVERY DAY  90 tablet  3   No current facility-administered medications for this visit.    Allergies as of 04/24/2012  . (No Known Allergies)    Family History  Problem Relation Age of Onset  . Cancer Mother     male  . Glaucoma Father   . Alzheimer's disease Mother   . Coronary artery disease Neg Hx   . Stroke Neg Hx   . Diabetes Neg Hx   . Colon cancer Neg Hx     History   Social History  . Marital Status: Married    Spouse Name: N/A    Number of Children: N/A  .  Years of Education: N/A   Occupational History  . Sports Psychologist Other   Social History Main Topics  . Smoking status: Never Smoker   . Smokeless tobacco: Former Neurosurgeon    Types: Chew  . Alcohol Use: No  . Drug Use: No  . Sexually Active: No   Other Topics Concern  . Not on file   Social History Narrative   Caffeine: 2-3 cups coffee/day   Lives with wife.  (grown children)   Occupation: retired, was Restaurant manager, fast food   Activity: golf, fishing   Diet: good water, fruits/vegetables      Physical Exam: BP 104/72  Pulse 60  Ht 6\' 1"  (1.854 m)  Wt 222 lb 12.8 oz (101.061 kg)  BMI 29.4 kg/m2 Constitutional: generally well-appearing, clearly poor memory Psychiatric: alert and oriented x3 Abdomen: soft, nontender, nondistended, no obvious ascites, no peritoneal signs, normal bowel sounds     Assessment and plan: 67 y.o. male  with improved anal rectal discomfort, still with chronic constipation, recent stroke  I recommended he take MiraLax powder on a daily basis for his constipation and this could help his anal rectal discomfort as well. I explained to him that a colonoscopy in the setting of such a recent stroke was not a safe idea. He will return here to see me in 2-3 months and we will rediscuss this, see how he is recovered.

## 2012-04-24 NOTE — Patient Instructions (Addendum)
Take one dose of Miralax powder every single day. No plans for colonoscopy currently due to recent stroke. Please return to see Dr. Christella Hartigan in 2-3 months, will consider colonoscopy at that point.                                               We are excited to introduce MyChart, a new best-in-class service that provides you online access to important information in your electronic medical record. We want to make it easier for you to view your health information - all in one secure location - when and where you need it. We expect MyChart will enhance the quality of care and service we provide.  When you register for MyChart, you can:    View your test results.    Request appointments and receive appointment reminders via email.    Request medication renewals.    View your medical history, allergies, medications and immunizations.    Communicate with your physician's office through a password-protected site.    Conveniently print information such as your medication lists.  To find out if MyChart is right for you, please talk to a member of our clinical staff today. We will gladly answer your questions about this free health and wellness tool.  If you are age 30 or older and want a member of your family to have access to your record, you must provide written consent by completing a proxy form available at our office. Please speak to our clinical staff about guidelines regarding accounts for patients younger than age 82.  As you activate your MyChart account and need any technical assistance, please call the MyChart technical support line at [phone]) or email your question to [email or web address]. If you email your question(s), please include your name, a return phone number and the best time to reach you.  If you have non-urgent health-related questions, you can send a message to our office through MyChart at [email or web address]. If you have a medical emergency,  call 911.  Thank you for using MyChart as your new health and wellness resource!   MyChart licensed from [employer],  1191-4782. Patents Pending.

## 2012-04-27 ENCOUNTER — Telehealth: Payer: Self-pay

## 2012-04-27 NOTE — Telephone Encounter (Signed)
Pt's wife said on 04/25/12 pt started with dizziness on and off; pt would not go to hospital for eval. Pt received call from Dr Anne Hahn but pt could not remember what Dr Anne Hahn said; Mrs Steidle said she has called Dr Anne Hahn office but no call back yet. Mrs Grieshaber very concerned about pt's short term memory and dizziness. Spoke with pt and he said had dizziness on Sat but it is gone now; pt does have numbness in rt hand and rt side of lips. No slurred speech and no pain. Pts wife request call back.

## 2012-04-27 NOTE — Telephone Encounter (Signed)
Spoke with pt, discussed concerns.

## 2012-04-27 NOTE — Telephone Encounter (Signed)
Tried to call - left message. Noted message from Dr. Anne Hahn in our system 04/23/2012 - no changes, just discussed MRI/MRA results and rec continue aspirin daily.

## 2012-04-28 ENCOUNTER — Telehealth: Payer: Self-pay | Admitting: *Deleted

## 2012-04-28 NOTE — Telephone Encounter (Signed)
I called and left a message for the patient's spouse to callback to the office concerning her message.

## 2012-05-05 ENCOUNTER — Telehealth: Payer: Self-pay | Admitting: *Deleted

## 2012-05-05 NOTE — Telephone Encounter (Signed)
I called patient. The patient's wife was concerned about the medications he was taking. She wanted to go over his list of medications. The patient is having some memory issues since the stroke, and she wanted to review his medications to make sure he was on the right medication. I reviewed the list with her, the list appear to be consistent with what we had documented that he was taking.

## 2012-05-05 NOTE — Telephone Encounter (Signed)
Patient's spouse called wanting to speak with physician concerning her husband's medications. Patient spouse has concerns about what medications her husband should be taken since having a stroke.

## 2012-05-08 ENCOUNTER — Encounter: Payer: Self-pay | Admitting: Family Medicine

## 2012-05-09 ENCOUNTER — Encounter (HOSPITAL_COMMUNITY): Payer: Self-pay | Admitting: *Deleted

## 2012-05-09 ENCOUNTER — Emergency Department (HOSPITAL_COMMUNITY)
Admission: EM | Admit: 2012-05-09 | Discharge: 2012-05-09 | Disposition: A | Discharge status: Home / Self Care | Payer: Medicare Other | Attending: Emergency Medicine | Admitting: Emergency Medicine

## 2012-05-09 DIAGNOSIS — H5440 Blindness, one eye, unspecified eye: Secondary | ICD-10-CM | POA: Insufficient documentation

## 2012-05-09 DIAGNOSIS — Z8673 Personal history of transient ischemic attack (TIA), and cerebral infarction without residual deficits: Secondary | ICD-10-CM | POA: Insufficient documentation

## 2012-05-09 DIAGNOSIS — Z79899 Other long term (current) drug therapy: Secondary | ICD-10-CM | POA: Insufficient documentation

## 2012-05-09 DIAGNOSIS — I10 Essential (primary) hypertension: Secondary | ICD-10-CM | POA: Insufficient documentation

## 2012-05-09 DIAGNOSIS — Z860100 Personal history of colon polyps, unspecified: Secondary | ICD-10-CM | POA: Insufficient documentation

## 2012-05-09 DIAGNOSIS — Z85038 Personal history of other malignant neoplasm of large intestine: Secondary | ICD-10-CM | POA: Insufficient documentation

## 2012-05-09 LAB — POCT I-STAT TROPONIN I: Troponin i, poc: 0 ng/mL (ref 0.00–0.08)

## 2012-05-09 LAB — BASIC METABOLIC PANEL
Calcium: 10 mg/dL (ref 8.4–10.5)
Creatinine, Ser: 0.75 mg/dL (ref 0.50–1.35)
GFR calc non Af Amer: >90 mL/min (ref >90)
Sodium: 140 mEq/L (ref 135–145)

## 2012-05-09 LAB — CBC
MCH: 33.3 pg (ref 26.0–34.0)
MCHC: 35.4 g/dL (ref 30.0–36.0)
Platelets: 199 10*3/uL (ref 150–400)
RDW: 12.5 % (ref 11.5–15.5)

## 2012-05-09 NOTE — ED Provider Notes (Signed)
History     CSN: [id number]  Arrival date & time 05/09/12  2102   First MD Initiated Contact with Patient 05/09/12 2148      Chief Complaint  Patient presents with  . Hypertension    (Consider location/radiation/quality/duration/timing/severity/associated sxs/prior treatment) HPI Comments: 67 y M with PMH of HTN and CVA here out of concern due to elevated blood pressure.  His wife reports that his face was flushed (not diaphoretic) so she took him to the drugstore to have his BP checked.  She states that they checked it several times and the highest values were 150-160s so they wanted to get him checked out.  Pt denies ever having any CP, SOB, nausea, vision changes, HA, weakness, numbness, parethesias or other complaints.  Patient is a 67 y.o. male presenting with hypertension. The history is provided by the patient and the spouse.  Hypertension This is a chronic problem. The current episode started more than 1 year ago. The problem occurs constantly. The problem has been gradually worsening. Pertinent negatives include no abdominal pain, chest pain, headaches, numbness or weakness. The symptoms are aggravated by stress.    Past Medical History  Diagnosis Date  . Hypertension   . History of CT scan of abdomen 04/2005    pelvis normal  . 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  . Benign neoplasm of colon 05/15/2005    tubular adenomas polyps  . Colon polyp 2007  . CVA (cerebral vascular accident) 03/27/2012    left occipital lobe - acute or subacute infarction. No hemorrhage.     Past Surgical History  Procedure Laterality Date  . 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 Esophagogastroduodenoscopy  05/24/2005    mild erythema in antrum  . Cataract extraction  2013    left    Family History  Problem Relation Age of Onset  . Cancer Mother     male  . Glaucoma Father   . Alzheimer's disease Mother    . Coronary artery disease Neg Hx   . Stroke Neg Hx   . Diabetes Neg Hx   . Colon cancer Neg Hx     History  Substance Use Topics  . Smoking status: Never Smoker   . Smokeless tobacco: Former Neurosurgeon    Types: Chew  . Alcohol Use: No      Review of Systems  Eyes: Negative for visual disturbance.  Respiratory: Negative for chest tightness and shortness of breath.   Cardiovascular: Negative for chest pain.  Gastrointestinal: Negative for abdominal pain.  Neurological: Negative for dizziness, weakness, numbness and headaches.  All other systems reviewed and are negative.    Allergies  Review of patient's allergies indicates no known allergies.  Home Medications   Current Outpatient Rx  Name  Route  Sig  Dispense  Refill  . atorvastatin (LIPITOR) 40 MG tablet   Oral   Take 1 tablet (40 mg total) by mouth daily.   90 tablet   3   . cyanocobalamin 500 MCG tablet   Oral   Take 500 mcg by mouth daily. Vitamin b12         . diphenhydramine-acetaminophen (TYLENOL PM EXTRA STRENGTH) 25-500 MG TABS   Oral   Take 1 tablet by mouth at bedtime as needed.           . dipyridamole-aspirin (AGGRENOX) 200-25 MG per  12 hr capsule   Oral   Take 1 capsule by mouth 2 (two) times daily.         . hydrochlorothiazide (MICROZIDE) 12.5 MG capsule   Oral   Take 1 capsule (12.5 mg total) by mouth daily.   90 capsule   3   . hydrocortisone (ANUSOL-HC) 25 MG suppository   Rectal   Place 1 suppository (25 mg total) rectally 2 (two) times daily.   24 suppository   0   . polyethylene glycol powder (GLYCOLAX/MIRALAX) powder      Take 1/2 to a whole capful mixed in 8 oz fluid as needed for constipation, hold for diarrhea.   3350 g   1   . Pramoxine-HC (HYDROCORTISONE ACE-PRAMOXINE) 2.5-1 % CREA      Use rectally 3-4 times daily during the day.   1 Tube   1   . quinapril (ACCUPRIL) 40 MG tablet      TAKE 1 TABLET EVERY DAY   90 tablet   3     BP 161/89  Pulse 70   Temp(Src) 98.1 F (36.7 C) (Oral)  Resp 18  SpO2 97%  Physical Exam  Vitals reviewed. Constitutional: He is oriented to person, place, and time. He appears well-developed and well-nourished. No distress.  HENT:  Head: Normocephalic.  Right Ear: External ear normal.  Left Ear: External ear normal.  Nose: Nose normal.  Mouth/Throat: Oropharynx is clear and moist. No oropharyngeal exudate.  Eyes: Conjunctivae and EOM are normal. Pupils are equal, round, and reactive to light.  Neck: Normal range of motion. Neck supple.  Cardiovascular: Normal rate, regular rhythm, normal heart sounds and intact distal pulses.  Exam reveals no gallop and no friction rub.   No murmur heard. Pulmonary/Chest: Effort normal and breath sounds normal.  Abdominal: Soft. Bowel sounds are normal. He exhibits no distension. There is no tenderness.  Musculoskeletal: Normal range of motion. He exhibits no edema and no tenderness.  Neurological: He is alert and oriented to person, place, and time. No cranial nerve deficit.  Skin: Skin is warm and dry.  Psychiatric: He has a normal mood and affect.    ED Course  Procedures (including critical care time)  Labs Reviewed  BASIC METABOLIC PANEL - Abnormal; Notable for the following:    Glucose, Bld 106 (*)    All other components within normal limits  CBC  POCT I-STAT TROPONIN I   No results found.   1. Hypertension       MDM   67 y M with PMH of HTN and CVA here out of concern due to elevated blood pressure.  His wife reports that his face was flushed (not diaphoretic) so she took him to the drugstore to have his BP checked.  She states that they checked it several times and the highest values were 150-160s so they wanted to get him checked out.  Pt denies ever having any CP, SOB, nausea, vision changes, HA, weakness, numbness, parethesias or other complaints.  BP 130/90s on my exam.  Pt well appearing.  Exam benign.  Presentation c/w asymptomatic  hypertension, now improved.  Return precautions reviewed.  It is felt the pt is stable for d/c with close PCP f/u.  All questions answered and patient expressed understanding.  Disposition: Discharge  Condition: Good  Pt seen in conjunction with my attending, Dr. Rhunette Croft.  Follow-up Information   Follow up with Eustaquio Boyden, MD. (As needed)    Contact information:   [address]  [address] [place] [address] [phone]        Oleh Genin, MD PGY-II [facility] Emergency Medicine Resident   Oleh Genin, MD 05/10/12 0020  I performed a history and physical examination of  Evan Morales and discussed his management with Dr. Beverely Pace. I agree with the history, physical, assessment, and plan of care, with the following exceptions: None I was present for the following procedures: None  Time Spent in Critical Care of the patient: None  Time spent in discussions with the patient and family: 5-10 minutes  Manpreet Kemmer  Derwood Kaplan, MD 05/10/12 1520

## 2012-05-09 NOTE — ED Notes (Signed)
Pt states he was outside doing yardwork today and felt his face get flushed.  Wife insisted he get his BP checked at the fire dept - 158/78.  No vision changes, no dizziness, no facial droop, no arm drift, no arm tingling or numbness.

## 2012-05-09 NOTE — ED Notes (Addendum)
Pt wife states that the pt has been complaining of not feeling well for the past couple of days, states he has short term memory loss from a CVA and cannot remember telling her this.  Pt went to fire department to have BP assessed, fire dept told him BP was high and HR was irregular.  BP 144/87 upon arrival to exam room, NSR on monitor at 68 bpm.  Pt denies any complaints at this time.  Will continue to monitor pt.

## 2012-05-14 ENCOUNTER — Encounter: Payer: Self-pay | Admitting: Family Medicine

## 2012-05-14 ENCOUNTER — Ambulatory Visit (INDEPENDENT_AMBULATORY_CARE_PROVIDER_SITE_OTHER): Payer: Medicare Other | Admitting: Family Medicine

## 2012-05-14 VITALS — BP 118/64 | HR 64 | Temp 98.0°F | Wt 223.5 lb

## 2012-05-14 DIAGNOSIS — I10 Essential (primary) hypertension: Secondary | ICD-10-CM

## 2012-05-14 DIAGNOSIS — E7800 Pure hypercholesterolemia, unspecified: Secondary | ICD-10-CM

## 2012-05-14 DIAGNOSIS — I639 Cerebral infarction, unspecified: Secondary | ICD-10-CM

## 2012-05-14 DIAGNOSIS — I6350 Cerebral infarction due to unspecified occlusion or stenosis of unspecified cerebral artery: Secondary | ICD-10-CM

## 2012-05-14 NOTE — Patient Instructions (Addendum)
Ask Dr. Anne Hahn about memory. Good to see you today, call us with questions. Return fasting in next few weeks for blood work instead of lab visit prior to physical. Keep physical appointment.

## 2012-05-14 NOTE — Assessment & Plan Note (Signed)
Overall stable. Questions answered. Return next week fasting for blood work to monitor lipitor. Compliant with aggrenox bid. Did fail aspirin.

## 2012-05-14 NOTE — Progress Notes (Signed)
  Subjective:    Patient ID: Evan Morales, male    DOB: [birth year], 67 y.o.   MRN: [id number]  HPI CC: 1 mo f/u  Seen at ER over this past weekend due to flushed face and mildly elevated blood pressure 152/98 systolic.  Monitored for several hours, stable so sent home.  S/p L occipital lobe CVA 02/2012 - echocardiogram WNL except for mild LVH. Carotid US with minimal plaque buildup bilaterally - 0-39%.   Notes continued memory trouble and residual R 2nd/3rd finger and perioral numbness.   Has talk to give next week at church, anxious about this.  Constipation - miralax has helped.  Denies HA, dizziness.   Denies depression.  Neuro appt scheduled 08/20/2012. GI appt scheduled 06/30/2012   Past Medical History  Diagnosis Date  . Hypertension   . History of CT scan of abdomen 04/2005    pelvis normal  . 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  . Benign neoplasm of colon 05/15/2005    tubular adenomas polyps  . Colon polyp 2007  . CVA (cerebral vascular accident) 03/27/2012    left occipital lobe - acute or subacute infarction. No hemorrhage.     Past Surgical History  Procedure Laterality Date  . 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 Esophagogastroduodenoscopy  05/24/2005    mild erythema in antrum  . Cataract extraction  2013    left     Review of Systems Per HPI    Objective:   Physical Exam  Nursing note and vitals reviewed. Constitutional: He appears well-developed and well-nourished. No distress.  HENT:  Mouth/Throat: Oropharynx is clear and moist. No oropharyngeal exudate.  Eyes: Conjunctivae are normal. Pupils are equal, round, and reactive to light. No scleral icterus.  Neck: Normal range of motion. Neck supple. Carotid bruit is not present.  Cardiovascular: Normal rate, regular rhythm, normal heart sounds and intact distal pulses.   No murmur heard. Pulmonary/Chest: Effort normal and  breath sounds normal. No respiratory distress. He has no wheezes. He has no rales.  Musculoskeletal: He exhibits no edema.  Psychiatric: He has a normal mood and affect.       Assessment & Plan:

## 2012-05-28 ENCOUNTER — Other Ambulatory Visit (INDEPENDENT_AMBULATORY_CARE_PROVIDER_SITE_OTHER): Payer: Medicare Other

## 2012-05-28 DIAGNOSIS — I10 Essential (primary) hypertension: Secondary | ICD-10-CM

## 2012-05-28 DIAGNOSIS — R739 Hyperglycemia, unspecified: Secondary | ICD-10-CM

## 2012-05-28 DIAGNOSIS — E7800 Pure hypercholesterolemia, unspecified: Secondary | ICD-10-CM

## 2012-05-28 DIAGNOSIS — I639 Cerebral infarction, unspecified: Secondary | ICD-10-CM

## 2012-05-28 DIAGNOSIS — I6350 Cerebral infarction due to unspecified occlusion or stenosis of unspecified cerebral artery: Secondary | ICD-10-CM

## 2012-05-28 DIAGNOSIS — R7309 Other abnormal glucose: Secondary | ICD-10-CM

## 2012-05-28 LAB — COMPREHENSIVE METABOLIC PANEL
ALT: 27 U/L (ref 0–53)
Albumin: 4.2 g/dL (ref 3.5–5.2)
Alkaline Phosphatase: 37 U/L — ABNORMAL LOW (ref 39–117)
CO2: 29 mEq/L (ref 19–32)
GFR: 90.49 mL/min (ref >60.00)
Glucose, Bld: 98 mg/dL (ref 70–99)
Potassium: 4.1 mEq/L (ref 3.5–5.1)
Sodium: 134 mEq/L — ABNORMAL LOW (ref 135–145)
Total Protein: 6.9 g/dL (ref 6.0–8.3)

## 2012-05-28 LAB — LIPID PANEL: Total CHOL/HDL Ratio: 2

## 2012-06-01 ENCOUNTER — Encounter: Payer: Self-pay | Admitting: *Deleted

## 2012-06-30 ENCOUNTER — Ambulatory Visit: Payer: Medicare Other | Admitting: Gastroenterology

## 2012-07-06 ENCOUNTER — Other Ambulatory Visit: Payer: MEDICARE

## 2012-07-13 ENCOUNTER — Other Ambulatory Visit: Payer: Self-pay | Admitting: Family Medicine

## 2012-07-13 ENCOUNTER — Encounter: Payer: Self-pay | Admitting: Family Medicine

## 2012-07-13 ENCOUNTER — Ambulatory Visit (INDEPENDENT_AMBULATORY_CARE_PROVIDER_SITE_OTHER): Payer: Medicare Other | Admitting: Family Medicine

## 2012-07-13 VITALS — BP 118/64 | HR 64 | Temp 98.2°F | Ht 73.5 in | Wt 222.5 lb

## 2012-07-13 DIAGNOSIS — I639 Cerebral infarction, unspecified: Secondary | ICD-10-CM

## 2012-07-13 DIAGNOSIS — Z23 Encounter for immunization: Secondary | ICD-10-CM

## 2012-07-13 DIAGNOSIS — D126 Benign neoplasm of colon, unspecified: Secondary | ICD-10-CM

## 2012-07-13 DIAGNOSIS — R7309 Other abnormal glucose: Secondary | ICD-10-CM

## 2012-07-13 DIAGNOSIS — R739 Hyperglycemia, unspecified: Secondary | ICD-10-CM

## 2012-07-13 DIAGNOSIS — I10 Essential (primary) hypertension: Secondary | ICD-10-CM

## 2012-07-13 DIAGNOSIS — Z0000 Encounter for general adult medical examination without abnormal findings: Secondary | ICD-10-CM | POA: Insufficient documentation

## 2012-07-13 DIAGNOSIS — K5900 Constipation, unspecified: Secondary | ICD-10-CM

## 2012-07-13 DIAGNOSIS — I6350 Cerebral infarction due to unspecified occlusion or stenosis of unspecified cerebral artery: Secondary | ICD-10-CM

## 2012-07-13 DIAGNOSIS — E7800 Pure hypercholesterolemia, unspecified: Secondary | ICD-10-CM

## 2012-07-13 MED ORDER — HYDROCHLOROTHIAZIDE 12.5 MG PO CAPS: 12.5000 mg | ORAL_CAPSULE | Freq: Every day | ORAL | Status: DC

## 2012-07-13 MED ORDER — ASPIRIN-DIPYRIDAMOLE ER 25-200 MG PO CP12: 1.0000 | ORAL_CAPSULE | Freq: Two times a day (BID) | ORAL | Status: DC

## 2012-07-13 NOTE — Progress Notes (Signed)
Subjective:    Patient ID: Evan Morales, male    DOB: September 18, 1945, 67 y.o.   MRN: [id number]  HPI CC: medicare wellness visit  S/p L occipital lobe CVA (post circ) 02/2012 with residual R hand numbness and perioral numbness and memory.  F/u appt with neuro 07/2012. MRA: IMPRESSION:  Abnormal MRA head (without) demonstrating:  1. The left posterior cerebral artery has focal high grade atherosclerotic stenosis, 1cm from the basilar terminus.  2. The left middle cerebral artery anterior temporal branch has focal high grade atherosclerotic stenosis. MRI: IMPRESSION:  Abnormal MRI brain (without) demonstrating: 1. Subacute left occipital ischemic infarction, with corticallaminar necrosis. No hemorrhagic transformation. 2. Mild chronic small vessel ischemic disease.  Missed GI appt 06/30/2012 (Dr Christella Hartigan).  Considering surgery of L eye at [facility].  Returning to discuss this at [facility].  Caffeine: 2-3 cups coffee/day  Lives with wife. (grown children)  Occupation: retired, was Restaurant manager, fast food  Activity: fishing, yardwork Diet: good water, fruits/vegetables   Preventative:  Colonoscopy 04/2005 - adenomatous polyp. rec rpt 5 yrs. In setting of recent stroke, rec postpone colonoscopy - may return to discuss with GI. Prostate - always normal. Nocturia x2 nightly. No trouble with stream. Some weakness noted.  Pneumovax 2013 Td 2011 Would like shingles shot today. Has established at home - Does not want prolonged life support. If doesn't have capacity to make decisions would not want artificial prolongation of life. Wants to donate body to [facility].  Medications and allergies reviewed and updated in chart.  Past histories reviewed and updated if relevant as below. Patient Active Problem List   Diagnosis Date Noted  . Constipation 04/09/2012  . CVA (cerebral vascular accident) 03/27/2012  . Rectal pain 01/23/2012  . Hyperglycemia 07/05/2010  . HYPERCHOLESTEROLEMIA 05/04/2009  .  ECZEMA 05/05/2007  . HYPERTENSION 05/13/2006  . COLONIC POLYPS, ADENOMATOUS 05/15/2005   Past Medical History  Diagnosis Date  . Hypertension   . History of CT scan of abdomen 04/2005    pelvis normal  . 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  . Benign neoplasm of colon 05/15/2005    tubular adenomas polyps  . Colon polyp 2007  . CVA (cerebral vascular accident) 03/27/2012    left occipital lobe - acute or subacute infarction. No hemorrhage. residual numbness R index and middle finger and R periorally, residual memory loss   Past Surgical History  Procedure Laterality Date  . 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 Esophagogastroduodenoscopy  05/24/2005    mild erythema in antrum  . Cataract extraction  2013    left   History  Substance Use Topics  . Smoking status: Never Smoker   . Smokeless tobacco: Former Neurosurgeon    Types: Chew  . Alcohol Use: No   Family History  Problem Relation Age of Onset  . Cancer Mother     male  . Glaucoma Father   . Alzheimer's disease Mother   . Coronary artery disease Neg Hx   . Stroke Neg Hx   . Diabetes Neg Hx   . Colon cancer Neg Hx    No Known Allergies Current Outpatient Prescriptions on File Prior to Visit  Medication Sig Dispense Refill  . atorvastatin (LIPITOR) 40 MG tablet Take 1 tablet (40 mg total) by mouth daily.  90 tablet  3  . cyanocobalamin 500 MCG tablet Take 500  mcg by mouth daily. Vitamin b12      . diphenhydramine-acetaminophen (TYLENOL PM EXTRA STRENGTH) 25-500 MG TABS Take 1 tablet by mouth at bedtime as needed and may repeat dose one time if needed (pain).       Marland Kitchen dipyridamole-aspirin (AGGRENOX) 200-25 MG per 12 hr capsule Take 1 capsule by mouth 2 (two) times daily.      . hydrochlorothiazide (MICROZIDE) 12.5 MG capsule Take 1 capsule (12.5 mg total) by mouth daily.  90 capsule  3  . hydrocortisone-pramoxine (ANALPRAM-HC) 2.5-1 % rectal  cream Place 1 application rectally 2 (two) times daily as needed for hemorrhoids.      . polyethylene glycol powder (GLYCOLAX/MIRALAX) powder Take 1/2 to a whole capful mixed in 8 oz fluid as needed for constipation, hold for diarrhea.  3350 g  1  . quinapril (ACCUPRIL) 40 MG tablet TAKE 1 TABLET EVERY DAY  90 tablet  3  . hydrocortisone (ANUSOL-HC) 25 MG suppository Place 25 mg rectally 3 (three) times daily as needed for hemorrhoids.       No current facility-administered medications on file prior to visit.     Review of Systems  Constitutional: Negative for fever, chills, activity change, appetite change, fatigue and unexpected weight change.  HENT: Negative for hearing loss and neck pain.   Eyes: Negative for visual disturbance.  Respiratory: Negative for cough, chest tightness, shortness of breath and wheezing.   Cardiovascular: Negative for chest pain, palpitations and leg swelling.  Gastrointestinal: Negative for nausea, vomiting, abdominal pain, diarrhea, constipation, blood in stool and abdominal distention.  Genitourinary: Negative for hematuria and difficulty urinating.  Musculoskeletal: Negative for myalgias and arthralgias.  Skin: Negative for rash.  Neurological: Negative for dizziness, seizures, syncope and headaches.  Hematological: Negative for adenopathy. Does not bruise/bleed easily.  Psychiatric/Behavioral: Negative for dysphoric mood. The patient is not nervous/anxious.        Objective:   Physical Exam  Nursing note and vitals reviewed. Constitutional: He is oriented to person, place, and time. He appears well-developed and well-nourished. No distress.  HENT:  Head: Normocephalic and atraumatic.  Right Ear: External ear normal.  Left Ear: External ear normal.  Nose: Nose normal.  Mouth/Throat: Oropharynx is clear and moist. No oropharyngeal exudate.  Eyes: Conjunctivae and EOM are normal. Pupils are equal, round, and reactive to light. No scleral icterus.   Neck: Normal range of motion. Neck supple. No thyromegaly present.  Cardiovascular: Normal rate, regular rhythm, normal heart sounds and intact distal pulses.   No murmur heard. Pulses:      Radial pulses are 2+ on the right side, and 2+ on the left side.  Pulmonary/Chest: Effort normal and breath sounds normal. No respiratory distress. He has no wheezes. He has no rales.  Abdominal: Soft. Bowel sounds are normal. He exhibits no distension and no mass. There is no tenderness. There is no rebound and no guarding.  Genitourinary:  Deferred today  Musculoskeletal: Normal range of motion. He exhibits no edema.  Lymphadenopathy:    He has no cervical adenopathy.  Neurological: He is alert and oriented to person, place, and time.  CN grossly intact, station and gait intact  Skin: Skin is warm and dry. No rash noted.  Psychiatric: He has a normal mood and affect. His behavior is normal. Judgment and thought content normal.       Assessment & Plan:

## 2012-07-13 NOTE — Assessment & Plan Note (Signed)
Stable on miralax prn

## 2012-07-13 NOTE — Assessment & Plan Note (Signed)
Great control on lipitor - continue. 

## 2012-07-13 NOTE — Assessment & Plan Note (Signed)
Chronic, stable. Continue meds. 

## 2012-07-13 NOTE — Addendum Note (Signed)
Addended by: Josph Macho A on: 07/13/2012 09:59 AM   Modules accepted: Orders

## 2012-07-13 NOTE — Assessment & Plan Note (Signed)
To reschedule appt with GI in next few months.

## 2012-07-13 NOTE — Assessment & Plan Note (Signed)
Resolved

## 2012-07-13 NOTE — Patient Instructions (Addendum)
advil can interact with aspirin - doesn't let it work as well.  May try melatonin to sleep. Shingles shot today. Reschedule at your convenience appointment with Dr. Christella Hartigan. Bring me copy of advanced directive for your chart. Good to see you today, you are doing well.

## 2012-07-13 NOTE — Assessment & Plan Note (Addendum)
I have personally reviewed the Medicare Annual Wellness questionnaire and have noted 1. The patient's medical and social history 2. Their use of alcohol, tobacco or illicit drugs 3. Their current medications and supplements 4. The patient's functional ability including ADL's, fall risks, home safety risks and hearing or visual impairment. 5. Diet and physical activity 6. Evidence for depression or mood disorders The patients weight, height, BMI have been recorded in the chart.  Hearing and vision has been addressed. I have made referrals, counseling and provided education to the patient based review of the above and I have provided the pt with a written personalized care plan for preventive services. See scanned questionairre. Advanced directives discussed: pt will bring me copy of his advanced directive.  Reviewed preventative protocols and updated unless pt declined. Discussed preventative protocols.  Pt desires shingles shot today. H/o pos iFOB - will reschedule appt with Dr. Christella Hartigan in next few months. Deferred prostate exam today - will return to yearly checks next physical. Encouraged starting walking for exercise.

## 2012-07-13 NOTE — Assessment & Plan Note (Signed)
overall stable on current regimen - continue this. rtc 6 mo for f/u, keep appt next month with neuro.

## 2012-07-14 ENCOUNTER — Telehealth: Payer: Self-pay | Admitting: Gastroenterology

## 2012-07-14 NOTE — Telephone Encounter (Signed)
Message copied by Arna Snipe on Tue Jul 14, 2012 10:15 AM ------      Message from: Donata Duff      Created: Tue Jun 30, 2012  1:29 PM       Do not bill ------

## 2012-08-17 DIAGNOSIS — Z947 Corneal transplant status: Secondary | ICD-10-CM

## 2012-08-19 ENCOUNTER — Encounter: Payer: Self-pay | Admitting: Neurology

## 2012-08-19 DIAGNOSIS — I63239 Cerebral infarction due to unspecified occlusion or stenosis of unspecified carotid arteries: Secondary | ICD-10-CM

## 2012-08-19 DIAGNOSIS — H5310 Unspecified subjective visual disturbances: Secondary | ICD-10-CM

## 2012-08-20 ENCOUNTER — Ambulatory Visit (INDEPENDENT_AMBULATORY_CARE_PROVIDER_SITE_OTHER): Payer: Medicare Other | Admitting: Neurology

## 2012-08-20 ENCOUNTER — Encounter: Payer: Self-pay | Admitting: Neurology

## 2012-08-20 VITALS — BP 119/81 | HR 66 | Wt 224.0 lb

## 2012-08-20 DIAGNOSIS — I639 Cerebral infarction, unspecified: Secondary | ICD-10-CM

## 2012-08-20 DIAGNOSIS — I6350 Cerebral infarction due to unspecified occlusion or stenosis of unspecified cerebral artery: Secondary | ICD-10-CM

## 2012-08-20 NOTE — Progress Notes (Signed)
Reason for visit: Stroke  Evan Morales is an 67 y.o. male  History of present illness:  Evan Morales is a 67 year old right-handed white male with a history of a stroke event that occurred on 03/26/2012. The patient had sudden onset of visual disturbance and right face and hand numbness. 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 The patient has a prior traumatic blindness of the right eye, and he now has a dense homonymous visual field deficit with the left eye. The patient is unable to operate a motor vehicle, and he indicates that he has difficulty reading as well. The patient otherwise is relatively unimpaired from the stroke. The patient has reported some problems with memory that has persisted since the stroke. The patient was placed on aspirin, but his primary care physician has placed him on Aggrenox, which he finds difficult to afford. The patient has not noted any new medical issues that have come up since last seen. 2-D echocardiogram, and carotid Doppler study done were unremarkable. MRI of the brain confirmed the left posterior cerebral artery distribution stroke, and the patient had intracranial atherosclerotic changes involving the left posterior cerebral artery and left middle cerebral artery. The stroke was felt secondary to intrinsic atherosclerotic disease. The patient has dyslipidemia and hypertension, but he does not smoke.  Past Medical History  Diagnosis Date  . Hypertension   . History of CT scan of abdomen 04/2005    pelvis normal  . 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  . Benign neoplasm of colon 05/15/2005    tubular adenomas polyps  . Colon polyp 2007  . CVA (cerebral vascular accident) 03/27/2012    left occipital lobe - acute or subacute infarction. No hemorrhage. residual numbness R index and middle finger and R periorally, residual memory loss  . Dyslipidemia   . Degenerative arthritis     Past  Surgical History  Procedure Laterality Date  . 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 Esophagogastroduodenoscopy  05/24/2005    mild erythema in antrum  . Cataract extraction  2013    left    Family History  Problem Relation Age of Onset  . Cancer Mother     male  . Alzheimer's disease Mother   . Glaucoma Father   . Coronary artery disease Neg Hx   . Stroke Neg Hx   . Diabetes Neg Hx   . Colon cancer Neg Hx     Social history:  reports that he has never smoked. He has quit using smokeless tobacco. His smokeless tobacco use included Chew. He reports that he does not drink alcohol or use illicit drugs.  Allergies: No Known Allergies  Medications:  Current Outpatient Prescriptions on File Prior to Visit  Medication Sig Dispense Refill  . atorvastatin (LIPITOR) 40 MG tablet Take 1 tablet (40 mg total) by mouth daily.  90 tablet  3  . cyanocobalamin 500 MCG tablet Take 500 mcg by mouth daily. Vitamin b12      . dipyridamole-aspirin (AGGRENOX) 200-25 MG per 12 hr capsule Take 1 capsule by mouth 2 (two) times daily.  180 capsule  3  . hydrochlorothiazide (MICROZIDE) 12.5 MG capsule Take 1 capsule (12.5 mg total) by mouth daily.  90 capsule  3  . hydrocortisone-pramoxine (ANALPRAM-HC) 2.5-1 % rectal cream Place 1 application rectally 2 (two) times  daily as needed for hemorrhoids.      . polyethylene glycol powder (GLYCOLAX/MIRALAX) powder Take 1/2 to a whole capful mixed in 8 oz fluid as needed for constipation, hold for diarrhea.  3350 g  1  . quinapril (ACCUPRIL) 40 MG tablet TAKE 1 TABLET EVERY DAY  90 tablet  1   No current facility-administered medications on file prior to visit.    ROS:  Out of a complete 14 system review of symptoms, the patient complains only of the following symptoms, and all other reviewed systems are negative.  Weight loss, fatigue Hearing loss, ringing in the ears Blurred vision, loss of  vision Snoring Blood in the stool, constipation Feeling hot, flushing Joint pain  Memory loss, numbness, weakness, dizziness Anxiety Decreased energy, disinterest in activities Restless legs  Blood pressure 119/81, pulse 66, weight 224 lb (101.606 kg).  Physical Exam  General: The patient is alert and cooperative at the time of the examination.  Skin: No significant peripheral edema is noted.   Neurologic Exam  Cranial nerves: Facial symmetry is present. Speech is normal, no aphasia or dysarthria is noted. Extraocular movements are full. Visual fields are notable for blindness in the right eye, with a dense right homonymous visual field deficit with the left eye.  Motor: The patient has good strength in all 4 extremities.  Coordination: The patient has good finger-nose-finger and heel-to-shin bilaterally.  Gait and station: The patient has a normal gait. Tandem gait is normal. Romberg is negative. No drift is seen.  Reflexes: Deep tendon reflexes are symmetric.   Assessment/Plan:  1. Left posterior cerebral artery distribution stroke  2. Visual field disturbance  3. Memory disturbance  4. Hypertension  5. Dyslipidemia  The patient will continue antiplatelet agents, but I see no reason that he could not convert back to aspirin. The patient was on no antiplatelet agents at the time of the stroke. The patient finds Aggrenox difficult to afford. The patient will otherwise followup through this office if needed. The patient likely will have permanent memory issues.  Evan Palau MD 08/20/2012 2:02 PM  [facility] [address] [place], [place] [address]  Phone [phone] Fax [phone]

## 2012-10-20 ENCOUNTER — Telehealth: Payer: Self-pay

## 2012-10-20 DIAGNOSIS — I639 Cerebral infarction, unspecified: Secondary | ICD-10-CM

## 2012-10-20 NOTE — Telephone Encounter (Signed)
Unfortunately since Dr. Anne Hahn is the one who is managing this medication change, he does need to contact his office to let him know about this.  I can put in referral in Dr. Timoteo Expose absence for another neurologist but he needs to contact Dr. Anne Hahn in the interim.

## 2012-10-20 NOTE — Telephone Encounter (Signed)
Patient notified. He would like a new referral and will contact Dr. Anne Hahn' office in the meantime.

## 2012-10-20 NOTE — Telephone Encounter (Signed)
Pt left v/m; pt stopped Aggrenox and started taking ASA 325 mg twice a day; pt said Dr Anne Hahn advised pt to stop Aggrenox and start ASA when pt finished his Aggrenox.pt has noticed dizziness since taking ASA; no h/a, SOB or CP.Pt does not want to contact Dr Anne Hahn about dizziness. Pt also wants referral to a different neurologist to follow CVA; pt does not feel he gets a lot of input or direction from Dr Anne Hahn.Please advise. [facility] [address]pt request cb.

## 2012-10-29 ENCOUNTER — Encounter: Payer: Self-pay | Admitting: Neurology

## 2012-10-29 ENCOUNTER — Ambulatory Visit (INDEPENDENT_AMBULATORY_CARE_PROVIDER_SITE_OTHER): Payer: Medicare Other | Admitting: Neurology

## 2012-10-29 VITALS — BP 90/60 | HR 62 | Temp 97.7°F | Ht 74.0 in | Wt 228.0 lb

## 2012-10-29 DIAGNOSIS — I6350 Cerebral infarction due to unspecified occlusion or stenosis of unspecified cerebral artery: Secondary | ICD-10-CM

## 2012-10-29 DIAGNOSIS — I639 Cerebral infarction, unspecified: Secondary | ICD-10-CM

## 2012-10-29 NOTE — Patient Instructions (Signed)
1.  Take 1 aspirin a day. 2.  Blood pressure control. 3.  Continue cholesterol medication. 4.  Exercise and follow a Mediterranean diet. 5.  Okay to go fishing. 6.  No follow up necessary but call with any questions or concerns and I would be happy to see you.

## 2012-10-29 NOTE — Progress Notes (Signed)
NEUROLOGY CONSULTATION NOTE  Evan Morales MRN: [id number] DOB: 1945/10/31  Referring provider: Dr. Sharen Hones Primary care provider: Dr. Sharen Hones  Reason for consult:  Stroke  HISTORY OF PRESENT ILLNESS: Evan Morales is a 67 year old right-handed man with history of hypertension, dyslipidemia, and right eye blindness (trauma as child) who presents regarding history of stroke and stroke management.  He is accompanied by his wife.  Records and images were personally reviewed where available.    On 03/26/2012, he developed sudden onset of visual disturbance and numbness of the right hand and face while playing golf.  Numbness resolved the next day but he saw his PCP, who ordered CT of the brain, which revealed acute-subacute infarct in the left occipital lobe.  He was on ASA 325mg , which was changed to Aggrenox.  He was also started on Lipitor.  04/23/12 MRI Brain:  subacute left occipital infarct. 04/30/12 MRA Head:  focal high grade atherosclerotic stenosis of the left PCA, 1 cm from the basilar terminus.  Focal high grade atherosclerotic stenosis in anterior temporal branch of left MCA. 04/08/12 2D Echo: LVEF 60-65%, no wall motion abnormalities. 04/06/12 US Carotid Duplex: 0-39% bilateral ICA stenosis 05/28/12 LDL 31 05/11/11 EKG NSR  In addition to blindness in right eye, he has residual homonymous visual field deficit in the left eye.  He is unable to drive.  He also notes some memory problems since the stroke.  He saw Dr. Anne Hahn in July.  At that time, [name] found Aggrenox to be costly.  Dr. Anne Hahn recommended switching back to aspirin.  He started taking ASA 325mg  BID.  He presents for second opinion of management. He would like to go fishing with his buddies and wanted to know if it would be safe.  PAST MEDICAL HISTORY: Past Medical History  Diagnosis Date  . Hypertension   . History of CT scan of abdomen 04/2005    pelvis normal  . Blind right eye     trauma as child -  no longer driving.  . Benign neoplasm of colon 05/15/2005    tubular adenomas polyps  . Colon polyp 2007  . CVA (cerebral vascular accident) 03/27/2012    left occipital lobe - acute or subacute infarction. No hemorrhage. residual numbness R index and middle finger and R periorally, residual memory loss  . Dyslipidemia   . Degenerative arthritis     PAST SURGICAL HISTORY: Past Surgical History  Procedure Laterality Date  . 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 Esophagogastroduodenoscopy  05/24/2005    mild erythema in antrum  . Cataract extraction  2013    left    MEDICATIONS: Current Outpatient Prescriptions on File Prior to Visit  Medication Sig Dispense Refill  . atorvastatin (LIPITOR) 40 MG tablet Take 1 tablet (40 mg total) by mouth daily.  90 tablet  3  . cyanocobalamin 500 MCG tablet Take 500 mcg by mouth daily. Vitamin b12      . hydrochlorothiazide (MICROZIDE) 12.5 MG capsule Take 1 capsule (12.5 mg total) by mouth daily.  90 capsule  3  . hydrocortisone-pramoxine (ANALPRAM-HC) 2.5-1 % rectal cream Place 1 application rectally 2 (two) times daily as needed for hemorrhoids.      Marland Kitchen MELATONIN ER PO Take 1 tablet by mouth at bedtime.      . polyethylene glycol powder (GLYCOLAX/MIRALAX) powder Take 1/2 to a whole capful mixed in 8 oz fluid  as needed for constipation, hold for diarrhea.  3350 g  1  . quinapril (ACCUPRIL) 40 MG tablet TAKE 1 TABLET EVERY DAY  90 tablet  1  . dipyridamole-aspirin (AGGRENOX) 200-25 MG per 12 hr capsule Take 1 capsule by mouth 2 (two) times daily.  180 capsule  3   No current facility-administered medications on file prior to visit.    ALLERGIES: No Known Allergies  FAMILY HISTORY: Family History  Problem Relation Age of Onset  . Cancer Mother     male  . Alzheimer's disease Mother   . Glaucoma Father   . Coronary artery disease Neg Hx   . Stroke Neg Hx   . Diabetes Neg Hx   .  Colon cancer Neg Hx     SOCIAL HISTORY: History   Social History  . Marital Status: Married    Spouse Name: N/A    Number of Children: 2  . Years of Education: N/A   Occupational History  . Sports Psychologist Other   Social History Main Topics  . Smoking status: Never Smoker   . Smokeless tobacco: Former Neurosurgeon    Types: Chew  . Alcohol Use: No  . Drug Use: No  . Sexual Activity: No   Other Topics Concern  . Not on file   Social History Narrative   Caffeine: 2-3 cups coffee/day   Lives with wife.  (grown children)   Occupation: retired, was Restaurant manager, fast food   Activity: golf, fishing   Diet: good water, fruits/vegetables    REVIEW OF SYSTEMS: Constitutional: No fevers, chills, or sweats, no generalized fatigue, change in appetite Eyes: No visual changes, double vision, eye pain Ear, nose and throat: No hearing loss, ear pain, nasal congestion, sore throat Cardiovascular: No chest pain, palpitations Respiratory:  No shortness of breath at rest or with exertion, wheezes GastrointestinaI: No nausea, vomiting, diarrhea, abdominal pain, fecal incontinence Genitourinary:  No dysuria, urinary retention or frequency Musculoskeletal:  No neck pain, back pain Integumentary: No rash, pruritus, skin lesions Neurological: as above Psychiatric: No depression, insomnia, anxiety Endocrine: No palpitations, fatigue, diaphoresis, mood swings, change in appetite, change in weight, increased thirst Hematologic/Lymphatic:  No anemia, purpura, petechiae. Allergic/Immunologic: no itchy/runny eyes, nasal congestion, recent allergic reactions, rashes  PHYSICAL EXAM: Filed Vitals:   10/29/12 0747  BP: 90/60  Pulse: 62  Temp: 97.7 F (36.5 C)   General: No acute distress Head:  Normocephalic/atraumatic Neck: supple, no paraspinal tenderness, full range of motion Back: No paraspinal tenderness Heart: regular rate and rhythm Lungs: Clear to auscultation bilaterally. Vascular: No  carotid bruits. Neurological Exam: Mental status: alert and oriented to person, place, and time, speech fluent and not dysarthric, language intact. Cranial nerves: CN I: not tested CN II: right pupil fixed, left pupil round and reactive to light, right monocular blindness, right homonymous field cut in left eye, fundi not visualized CN III, IV, VI:  full range of motion, no nystagmus, no ptosis CN V: facial sensation intact CN VII: upper and lower face symmetric CN VIII: hearing intact CN IX, X: gag intact, uvula midline CN XI: sternocleidomastoid and trapezius muscles intact CN XII: tongue midline Bulk & Tone: normal, no fasciculations. Motor: 5-/5 right deltoid and triceps (may be limited due to shoulder pain), otherwise 5 out of 5 throughout. Sensation: Temperature and vibration intact Deep Tendon Reflexes: 2+ throughout, toes downgoing Finger to nose testing: Without dysmetria Heel to shin: Without dysmetria Gait: Normal stance and stride, no ataxia, able to walk in tandem. Romberg  negative.  IMPRESSION & PLAN: Left posterior cerebral artery stroke secondary to intracranial atherosclerosis. 1.  ASA can be taken as 325mg  once daily.  There is no significant benefit of higher dose, but does increase risk for bleeding. 2. Continue Lipitor.  LDL is at goal of <70. 3. Maintain blood pressure control 4. Mediterranean diet and exercise. 5. From neurological standpoint, okay to go fishing with friends.   No follow up is necessary unless needed.  45 minutes spent with patient and wife, over 50% spent on discussing stroke, etiology, counseling and coordinating care.  I answered all questions to the best of my ability.  Thank you for allowing me to take part in the care of this patient.  Shon Millet, DO  CC:  Eustaquio Boyden, MD

## 2012-11-16 ENCOUNTER — Telehealth: Payer: Self-pay

## 2012-11-16 NOTE — Telephone Encounter (Signed)
Pt request # for billing and ins. Questions about injection for shingles vaccine given in June 2014. Gave [phone]. Mrs Evan Morales does not want to talk with [facility] and request our office manager to ck on $320.00 bill for shingles vaccine.

## 2012-11-17 NOTE — Telephone Encounter (Signed)
Changed the dx code on the shingles vaccine charge and asked our charge correction department to refile the claim.  Called pt and explained this to him.  He understands and will wait for insurance to reprocess.

## 2013-02-23 ENCOUNTER — Ambulatory Visit (INDEPENDENT_AMBULATORY_CARE_PROVIDER_SITE_OTHER): Payer: Medicare Other

## 2013-02-23 DIAGNOSIS — Z23 Encounter for immunization: Secondary | ICD-10-CM

## 2013-03-20 ENCOUNTER — Encounter: Payer: Self-pay | Admitting: Family Medicine

## 2013-03-20 ENCOUNTER — Other Ambulatory Visit: Payer: Self-pay | Admitting: Family Medicine

## 2013-07-03 ENCOUNTER — Other Ambulatory Visit: Payer: Self-pay | Admitting: Family Medicine

## 2013-07-03 DIAGNOSIS — Z125 Encounter for screening for malignant neoplasm of prostate: Secondary | ICD-10-CM

## 2013-07-03 DIAGNOSIS — I10 Essential (primary) hypertension: Secondary | ICD-10-CM

## 2013-07-03 DIAGNOSIS — E538 Deficiency of other specified B group vitamins: Secondary | ICD-10-CM

## 2013-07-03 DIAGNOSIS — I639 Cerebral infarction, unspecified: Secondary | ICD-10-CM

## 2013-07-03 DIAGNOSIS — E7800 Pure hypercholesterolemia, unspecified: Secondary | ICD-10-CM

## 2013-07-08 ENCOUNTER — Telehealth: Payer: Self-pay

## 2013-07-08 ENCOUNTER — Encounter: Payer: Self-pay | Admitting: Family Medicine

## 2013-07-08 ENCOUNTER — Ambulatory Visit (INDEPENDENT_AMBULATORY_CARE_PROVIDER_SITE_OTHER): Payer: Medicare Other | Admitting: Family Medicine

## 2013-07-08 ENCOUNTER — Other Ambulatory Visit (INDEPENDENT_AMBULATORY_CARE_PROVIDER_SITE_OTHER): Payer: Medicare Other

## 2013-07-08 ENCOUNTER — Other Ambulatory Visit: Payer: Self-pay | Admitting: Family Medicine

## 2013-07-08 VITALS — BP 124/78 | HR 68 | Temp 98.3°F | Wt 232.5 lb

## 2013-07-08 DIAGNOSIS — Z125 Encounter for screening for malignant neoplasm of prostate: Secondary | ICD-10-CM

## 2013-07-08 DIAGNOSIS — I6350 Cerebral infarction due to unspecified occlusion or stenosis of unspecified cerebral artery: Secondary | ICD-10-CM

## 2013-07-08 DIAGNOSIS — I10 Essential (primary) hypertension: Secondary | ICD-10-CM

## 2013-07-08 DIAGNOSIS — E7800 Pure hypercholesterolemia, unspecified: Secondary | ICD-10-CM

## 2013-07-08 DIAGNOSIS — T148 Other injury of unspecified body region: Secondary | ICD-10-CM

## 2013-07-08 DIAGNOSIS — E538 Deficiency of other specified B group vitamins: Secondary | ICD-10-CM

## 2013-07-08 DIAGNOSIS — W57XXXA Bitten or stung by nonvenomous insect and other nonvenomous arthropods, initial encounter: Secondary | ICD-10-CM | POA: Insufficient documentation

## 2013-07-08 DIAGNOSIS — I639 Cerebral infarction, unspecified: Secondary | ICD-10-CM

## 2013-07-08 LAB — COMPREHENSIVE METABOLIC PANEL
ALK PHOS: 75 U/L (ref 39–117)
ALT: 15 U/L (ref 0–53)
AST: 18 U/L (ref 0–37)
Albumin: 3.8 g/dL (ref 3.5–5.2)
BUN: 13 mg/dL (ref 6–23)
CO2: 26 mEq/L (ref 19–32)
CREATININE: 0.8 mg/dL (ref 0.4–1.5)
Calcium: 9.4 mg/dL (ref 8.4–10.5)
Chloride: 104 mEq/L (ref 96–112)
GFR: 108.21 mL/min (ref >60.00)
Glucose, Bld: 96 mg/dL (ref 70–99)
POTASSIUM: 4 meq/L (ref 3.5–5.1)
Sodium: 136 mEq/L (ref 135–145)
Total Bilirubin: 1.7 mg/dL — ABNORMAL HIGH (ref 0.2–1.2)
Total Protein: 7.1 g/dL (ref 6.0–8.3)

## 2013-07-08 LAB — LIPID PANEL
CHOL/HDL RATIO: 3
Cholesterol: 91 mg/dL (ref 0–200)
HDL: 28.9 mg/dL — AB (ref >39.00)
LDL CALC: 49 mg/dL (ref 0–99)
NONHDL: 62.1
TRIGLYCERIDES: 65 mg/dL (ref 0.0–149.0)
VLDL: 13 mg/dL (ref 0.0–40.0)

## 2013-07-08 LAB — PSA, MEDICARE: PSA: 3.14 ng/ml (ref 0.10–4.00)

## 2013-07-08 LAB — VITAMIN B12: Vitamin B-12: 235 pg/mL (ref 211–911)

## 2013-07-08 LAB — TSH: TSH: 2.34 u[IU]/mL (ref 0.35–4.50)

## 2013-07-08 LAB — T4, FREE: Free T4: 0.92 ng/dL (ref 0.60–1.60)

## 2013-07-08 NOTE — Progress Notes (Signed)
Pre visit review using our clinic review tool, if applicable. No additional management support is needed unless otherwise documented below in the visit note. 

## 2013-07-08 NOTE — Progress Notes (Signed)
   BP 124/78  Pulse 68  Temp(Src) 98.3 F (36.8 C) (Oral)  Wt 232 lb 8 oz (105.461 kg)   CC: tick bite  Subjective:    Patient ID: Evan Morales, male    DOB: July 17, 1945, 68 y.o.   MRN: [id number]  HPI: Evan Morales is a 68 y.o. male presenting on 07/08/2013 for Insect Bite   Tiny tick pulled off R hip 2 evenings ago, unsure how long it was attached but it was embedded - did have redness around tick bite and now very itchy. Thinks all tick was removed - with tweezer.  Denies fever, new joint pains, significant rash, or abd pain, nausea, HA  Relevant past medical, surgical, family and social history reviewed and updated as indicated.  Allergies and medications reviewed and updated. Current Outpatient Prescriptions on File Prior to Visit  Medication Sig  . aspirin 325 MG tablet Take 325 mg by mouth 2 (two) times daily after a meal.  . atorvastatin (LIPITOR) 40 MG tablet TAKE 1 TABLET BY MOUTH DAILY  . dipyridamole-aspirin (AGGRENOX) 200-25 MG per 12 hr capsule Take 1 capsule by mouth 2 (two) times daily.  . hydrochlorothiazide (MICROZIDE) 12.5 MG capsule TAKE 1 CAPSULE (12.5 MG TOTAL) BY MOUTH DAILY.  . hydrocortisone-pramoxine (ANALPRAM-HC) 2.5-1 % rectal cream Place 1 application rectally 2 (two) times daily as needed for hemorrhoids.  Marland Kitchen MELATONIN ER PO Take 1 tablet by mouth at bedtime.  . polyethylene glycol powder (GLYCOLAX/MIRALAX) powder Take 1/2 to a whole capful mixed in 8 oz fluid as needed for constipation, hold for diarrhea.  . quinapril (ACCUPRIL) 40 MG tablet TAKE 1 TABLET EVERY DAY  . cyanocobalamin 500 MCG tablet Take 500 mcg by mouth daily. Vitamin b12   No current facility-administered medications on file prior to visit.    Review of Systems Per HPI unless specifically indicated above    Objective:    BP 124/78  Pulse 68  Temp(Src) 98.3 F (36.8 C) (Oral)  Wt 232 lb 8 oz (105.461 kg)  Physical Exam  Nursing note and vitals reviewed. Constitutional:  He appears well-developed and well-nourished. No distress.  Skin: Rash noted.  Erythematous slightly indurated rash without central clearing around site of tick bite R lateral hip. No retained tick part evident       Assessment & Plan:   Problem List Items Addressed This Visit   Tick bite - Primary     Tick bite R hip at belt line - staying irritated 2/2 rubbing. Not consistent with erythema migrans. Anticipate local reaction to tick bite. Treat with OTC hydrocortisone cream. Red flags to return discussed - discussed in detail RMSF and lyme disease sxs to monitor for over next 7 days..         Follow up plan: Return if symptoms worsen or fail to improve.

## 2013-07-08 NOTE — Telephone Encounter (Signed)
Will see today.  

## 2013-07-08 NOTE — Telephone Encounter (Signed)
Pt left note; 2 days ago pts wife removed tick from rt hip; 1.5" area where tick was on rt hip is red swollen,sore and itching.No fever and thinks got head of tick out. Pt scheduled appt today at 10 AM.

## 2013-07-08 NOTE — Assessment & Plan Note (Addendum)
Tick bite R hip at belt line - staying irritated 2/2 rubbing. Not consistent with erythema migrans. Anticipate local reaction to tick bite. Treat with OTC hydrocortisone cream. Red flags to return discussed - discussed in detail RMSF and lyme disease sxs to monitor for over next 7 days.Marland Kitchen

## 2013-07-08 NOTE — Patient Instructions (Signed)
I think this was a local reaction to the tick bite. May try cortisone cream to help skin irritation. Watch for new fever, fatigue, joint pains, or rash developing in next week - if this happens let me know. Good to see you today, call us with questions.  Tick Bite Information Ticks are insects that attach themselves to the skin and draw blood for food. There are various types of ticks. Common types include wood ticks and deer ticks. Most ticks live in shrubs and grassy areas. Ticks can climb onto your body when you make contact with leaves or grass where the tick is waiting. The most common places on the body for ticks to attach themselves are the scalp, neck, armpits, waist, and groin. Most tick bites are harmless, but sometimes ticks carry germs that cause diseases. These germs can be spread to a person during the tick's feeding process. The chance of a disease spreading through a tick bite depends on:   The type of tick.  Time of year.   How long the tick is attached.   Geographic location.  HOW CAN YOU PREVENT TICK BITES? Take these steps to help prevent tick bites when you are outdoors:  Wear protective clothing. Long sleeves and long pants are best.   Wear white clothes so you can see ticks more easily.  Tuck your pant legs into your socks.   If walking on a trail, stay in the middle of the trail to avoid brushing against bushes.  Avoid walking through areas with long grass.  Put insect repellent on all exposed skin and along boot tops, pant legs, and sleeve cuffs.   Check clothing, hair, and skin repeatedly and before going inside.   Brush off any ticks that are not attached.  Take a shower or bath as soon as possible after being outdoors.  WHAT IS THE PROPER WAY TO REMOVE A TICK? Ticks should be removed as soon as possible to help prevent diseases caused by tick bites. 1. If latex gloves are available, put them on before trying to remove a tick.  2. Using  fine-point tweezers, grasp the tick as close to the skin as possible. You may also use curved forceps or a tick removal tool. Grasp the tick as close to its head as possible. Avoid grasping the tick on its body. 3. Pull gently with steady upward pressure until the tick lets go. Do not twist the tick or jerk it suddenly. This may break off the tick's head or mouth parts. 4. Do not squeeze or crush the tick's body. This could force disease-carrying fluids from the tick into your body.  5. After the tick is removed, wash the bite area and your hands with soap and water or other disinfectant such as alcohol. 6. Apply a small amount of antiseptic cream or ointment to the bite site.  7. Wash and disinfect any instruments that were used.  Do not try to remove a tick by applying a hot match, petroleum jelly, or fingernail polish to the tick. These methods do not work and may increase the chances of disease being spread from the tick bite.  WHEN SHOULD YOU SEEK MEDICAL CARE? Contact your health care provider if you are unable to remove a tick from your skin or if a part of the tick breaks off and is stuck in the skin.  After a tick bite, you need to be aware of signs and symptoms that could be related to diseases spread by ticks.  Contact your health care provider if you develop any of the following in the days or weeks after the tick bite:  Unexplained fever.  Rash. A circular rash that appears days or weeks after the tick bite may indicate the possibility of Lyme disease. The rash may resemble a target with a bull's-eye and may occur at a different part of your body than the tick bite.  Redness and swelling in the area of the tick bite.   Tender, swollen lymph glands.   Diarrhea.   Weight loss.   Cough.   Fatigue.   Muscle, joint, or bone pain.   Abdominal pain.   Headache.   Lethargy or a change in your level of consciousness.  Difficulty walking or moving your legs.    Numbness in the legs.   Paralysis.  Shortness of breath.   Confusion.   Repeated vomiting.  Document Released: 01/12/2000 Document Revised: 11/04/2012 Document Reviewed: 06/24/2012 [facility] Patient Information 2014 [place].

## 2013-07-09 ENCOUNTER — Telehealth: Payer: Self-pay | Admitting: Family Medicine

## 2013-07-09 NOTE — Telephone Encounter (Signed)
Relevant patient education mailed to patient.  

## 2013-07-15 ENCOUNTER — Ambulatory Visit (INDEPENDENT_AMBULATORY_CARE_PROVIDER_SITE_OTHER): Payer: Medicare Other | Admitting: Family Medicine

## 2013-07-15 ENCOUNTER — Encounter: Payer: Self-pay | Admitting: Family Medicine

## 2013-07-15 VITALS — BP 126/78 | HR 68 | Temp 98.2°F | Ht 73.5 in | Wt 235.5 lb

## 2013-07-15 DIAGNOSIS — I639 Cerebral infarction, unspecified: Secondary | ICD-10-CM

## 2013-07-15 DIAGNOSIS — E7800 Pure hypercholesterolemia, unspecified: Secondary | ICD-10-CM

## 2013-07-15 DIAGNOSIS — Z1211 Encounter for screening for malignant neoplasm of colon: Secondary | ICD-10-CM

## 2013-07-15 DIAGNOSIS — Z0000 Encounter for general adult medical examination without abnormal findings: Secondary | ICD-10-CM

## 2013-07-15 DIAGNOSIS — W57XXXA Bitten or stung by nonvenomous insect and other nonvenomous arthropods, initial encounter: Secondary | ICD-10-CM

## 2013-07-15 DIAGNOSIS — K5900 Constipation, unspecified: Secondary | ICD-10-CM

## 2013-07-15 DIAGNOSIS — T148 Other injury of unspecified body region: Secondary | ICD-10-CM

## 2013-07-15 DIAGNOSIS — I6350 Cerebral infarction due to unspecified occlusion or stenosis of unspecified cerebral artery: Secondary | ICD-10-CM

## 2013-07-15 DIAGNOSIS — I10 Essential (primary) hypertension: Secondary | ICD-10-CM

## 2013-07-15 DIAGNOSIS — D126 Benign neoplasm of colon, unspecified: Secondary | ICD-10-CM

## 2013-07-15 MED ORDER — ASPIRIN 325 MG PO TABS: 325.0000 mg | ORAL_TABLET | Freq: Every day | ORAL | Status: DC

## 2013-07-15 NOTE — Assessment & Plan Note (Signed)
Stable on miralax.

## 2013-07-15 NOTE — Assessment & Plan Note (Signed)
Chronic, stable. Continue regimen. 

## 2013-07-15 NOTE — Assessment & Plan Note (Addendum)
I have personally reviewed the Medicare Annual Wellness questionnaire and have noted 1. The patient's medical and social history 2. Their use of alcohol, tobacco or illicit drugs 3. Their current medications and supplements 4. The patient's functional ability including ADL's, fall risks, home safety risks and hearing or visual impairment. 5. Diet and physical activity 6. Evidence for depression or mood disorders The patients weight, height, BMI have been recorded in the chart.  Hearing and vision has been addressed. I have made referrals, counseling and provided education to the patient based review of the above and I have provided the pt with a written personalized care plan for preventive services. See scanned questionairre. Advanced directives discussed: in chart.  Reviewed preventative protocols and updated unless pt declined. Thinks would want to d/c prostate cancer screening

## 2013-07-15 NOTE — Assessment & Plan Note (Signed)
Resolved, asxs.

## 2013-07-15 NOTE — Progress Notes (Signed)
BP 126/78  Pulse 68  Temp(Src) 98.2 F (36.8 C) (Oral)  Ht 6' 1.5" (1.867 m)  Wt 235 lb 8 oz (106.822 kg)  BMI 30.65 kg/m2   CC: medicare wellness visit  Subjective:    Patient ID: Evan Morales, male    DOB: 28-Mar-1945, 68 y.o.   MRN: [id number]  HPI: Evan Morales is a 68 y.o. male presenting on 07/15/2013 for Annual Exam   Seen here last week with tick bite - no new sxs of fever/rash, joint pains. Did not treat with abx.  Hearing screen passed. Recent eye exam. 1 fall in last year, no injury. Denies depression/anhedonia, sadness.  Preventative: Colonoscopy 04/2005 - adenomatous polyp. rec rpt 5 yrs. In setting of recent stroke, rec postpone colonoscopy - desires stool kit today although not quite sure he would go through with colonoscopy even if positive. Prostate - always normal. Improved nocturia - doesn't drink fluids after 6pm. Desires to stop screening. Flu 01/2013 Pneumovax 2013, prevnar - declines today. Td 2011. zostavax 06/2012. Has established at home - Does not want prolonged life support. If doesn't have capacity to make decisions would not want artificial prolongation of life. Wants to donate body to [facility].  Caffeine: 2-3 cups coffee/day Lives with wife. (grown children)  Occupation: retired, was Occupational hygienist  Activity: fishing, yardwork  Diet: good water, fruits/vegetables daily  Relevant past medical, surgical, family and social history reviewed and updated as indicated.  Allergies and medications reviewed and updated. Current Outpatient Prescriptions on File Prior to Visit  Medication Sig  . atorvastatin (LIPITOR) 40 MG tablet TAKE 1 TABLET BY MOUTH DAILY  . cyanocobalamin 500 MCG tablet Take 500 mcg by mouth daily. Vitamin b12  . hydrochlorothiazide (MICROZIDE) 12.5 MG capsule TAKE 1 CAPSULE (12.5 MG TOTAL) BY MOUTH DAILY.  . hydrocortisone-pramoxine (ANALPRAM-HC) 2.5-1 % rectal cream Place 1 application rectally 2 (two) times daily as  needed for hemorrhoids.  Marland Kitchen MELATONIN ER PO Take 1 tablet by mouth at bedtime.  . polyethylene glycol powder (GLYCOLAX/MIRALAX) powder Take 1/2 to a whole capful mixed in 8 oz fluid as needed for constipation, hold for diarrhea.  . quinapril (ACCUPRIL) 40 MG tablet TAKE 1 TABLET EVERY DAY   No current facility-administered medications on file prior to visit.    Review of Systems Per HPI unless specifically indicated above    Objective:    BP 126/78  Pulse 68  Temp(Src) 98.2 F (36.8 C) (Oral)  Ht 6' 1.5" (1.867 m)  Wt 235 lb 8 oz (106.822 kg)  BMI 30.65 kg/m2  Physical Exam  Nursing note and vitals reviewed. Constitutional: He is oriented to person, place, and time. He appears well-developed and well-nourished. No distress.  HENT:  Head: Normocephalic and atraumatic.  Right Ear: Hearing, tympanic membrane, external ear and ear canal normal.  Left Ear: Hearing, tympanic membrane, external ear and ear canal normal.  Nose: Nose normal.  Mouth/Throat: Uvula is midline, oropharynx is clear and moist and mucous membranes are normal. No oropharyngeal exudate, posterior oropharyngeal edema or posterior oropharyngeal erythema.  Eyes: Conjunctivae and EOM are normal. Pupils are equal, round, and reactive to light. No scleral icterus.  Neck: Normal range of motion. Neck supple. Carotid bruit is not present. No thyromegaly present.  Cardiovascular: Normal rate, regular rhythm, normal heart sounds and intact distal pulses.   No murmur heard. Pulses:      Radial pulses are 2+ on the right side, and 2+ on the left side.  Pulmonary/Chest: Effort normal and breath sounds normal. No respiratory distress. He has no wheezes. He has no rales.  Abdominal: Soft. Bowel sounds are normal. He exhibits no distension and no mass. There is no tenderness. There is no rebound and no guarding.  Genitourinary:  Pt declines DRE today.  Musculoskeletal: Normal range of motion. He exhibits no edema.    Lymphadenopathy:    He has no cervical adenopathy.  Neurological: He is alert and oriented to person, place, and time.  CN grossly intact, station and gait intact Recall 3/3 Calculation 5/5 serial 7s  Skin: Skin is warm and dry. No rash noted.  Psychiatric: He has a normal mood and affect. His behavior is normal. Judgment and thought content normal.   Results for orders placed in visit on 07/08/13  VITAMIN B12      Result Value Ref Range   Vitamin B-12 235  211 - 911 pg/mL  PSA, MEDICARE      Result Value Ref Range   PSA 3.14  0.10 - 4.00 ng/ml  TSH      Result Value Ref Range   TSH 2.34  0.35 - 4.50 uIU/mL  T4, FREE      Result Value Ref Range   Free T4 0.92  0.60 - 1.60 ng/dL  LIPID PANEL      Result Value Ref Range   Cholesterol 91  0 - 200 mg/dL   Triglycerides 65.0  0.0 - 149.0 mg/dL   HDL 28.90 (*) >39.00 mg/dL   VLDL 13.0  0.0 - 40.0 mg/dL   LDL Cholesterol 49  0 - 99 mg/dL   Total CHOL/HDL Ratio 3     NonHDL 62.10    COMPREHENSIVE METABOLIC PANEL      Result Value Ref Range   Sodium 136  135 - 145 mEq/L   Potassium 4.0  3.5 - 5.1 mEq/L   Chloride 104  96 - 112 mEq/L   CO2 26  19 - 32 mEq/L   Glucose, Bld 96  70 - 99 mg/dL   BUN 13  6 - 23 mg/dL   Creatinine, Ser 0.8  0.4 - 1.5 mg/dL   Total Bilirubin 1.7 (*) 0.2 - 1.2 mg/dL   Alkaline Phosphatase 75  39 - 117 U/L   AST 18  0 - 37 U/L   ALT 15  0 - 53 U/L   Total Protein 7.1  6.0 - 8.3 g/dL   Albumin 3.8  3.5 - 5.2 g/dL   Calcium 9.4  8.4 - 10.5 mg/dL   GFR 108.21  >60.00 mL/min      Assessment & Plan:   Problem List Items Addressed This Visit   RESOLVED: Tick bite     Resolved, asxs.    Medicare annual wellness visit, subsequent - Primary     I have personally reviewed the Medicare Annual Wellness questionnaire and have noted 1. The patient's medical and social history 2. Their use of alcohol, tobacco or illicit drugs 3. Their current medications and supplements 4. The patient's functional  ability including ADL's, fall risks, home safety risks and hearing or visual impairment. 5. Diet and physical activity 6. Evidence for depression or mood disorders The patients weight, height, BMI have been recorded in the chart.  Hearing and vision has been addressed. I have made referrals, counseling and provided education to the patient based review of the above and I have provided the pt with a written personalized care plan for preventive services. See scanned questionairre. Advanced  directives discussed: in chart.  Reviewed preventative protocols and updated unless pt declined. Thinks would want to d/c prostate cancer screening    HYPERTENSION     Chronic, stable. Continue regimen.    Relevant Medications      aspirin tablet   HYPERCHOLESTEROLEMIA     Chronic, stable. Continue lipitor dose. Discussed increased aerobic exercise to hopefully raise HDL.    Relevant Medications      aspirin tablet   CVA (cerebral vascular accident)     Off aggrenox for >6 mo. Discussed aspirin 351m bid vs qd dosing. rec qd dosing.    Relevant Medications      aspirin tablet   Constipation     Stable on miralax.    COLONIC POLYPS, ADENOMATOUS     Discussed with patient. He is hesitant for colonoscopy despite being overdue for f/u, but agrees to stool kit today.     Other Visit Diagnoses   Special screening for malignant neoplasms, colon        Relevant Orders       Fecal occult blood, imunochemical        Follow up plan: Return in about 1 year (around 07/16/2014), or as needed, for medicare wellness visit.

## 2013-07-15 NOTE — Assessment & Plan Note (Signed)
Discussed with patient. He is hesitant for colonoscopy despite being overdue for f/u, but agrees to stool kit today.

## 2013-07-15 NOTE — Progress Notes (Signed)
Pre visit review using our clinic review tool, if applicable. No additional management support is needed unless otherwise documented below in the visit note. 

## 2013-07-15 NOTE — Patient Instructions (Addendum)
Pass by lab to pick up stool kit. Make sure you're not taking aggrenox at home. You only need to take one full dose aspirin daily Good to see you today, call us with questions. Return as needed or in 1 year for next wellness exam.

## 2013-07-15 NOTE — Assessment & Plan Note (Signed)
Chronic, stable. Continue lipitor dose. Discussed increased aerobic exercise to hopefully raise HDL.

## 2013-07-15 NOTE — Assessment & Plan Note (Signed)
Off aggrenox for >6 mo. Discussed aspirin 325mg  bid vs qd dosing. rec qd dosing.

## 2013-07-20 ENCOUNTER — Other Ambulatory Visit: Payer: Self-pay | Admitting: Family Medicine

## 2013-07-23 ENCOUNTER — Other Ambulatory Visit: Payer: Self-pay | Admitting: Family Medicine

## 2013-10-07 ENCOUNTER — Other Ambulatory Visit: Payer: Self-pay | Admitting: Family Medicine

## 2013-11-26 ENCOUNTER — Ambulatory Visit (INDEPENDENT_AMBULATORY_CARE_PROVIDER_SITE_OTHER): Payer: Medicare Other

## 2013-11-26 DIAGNOSIS — Z23 Encounter for immunization: Secondary | ICD-10-CM

## 2014-03-09 ENCOUNTER — Other Ambulatory Visit: Payer: Self-pay | Admitting: *Deleted

## 2014-03-09 MED ORDER — ATORVASTATIN CALCIUM 40 MG PO TABS: 40.0000 mg | ORAL_TABLET | Freq: Every day | ORAL | Status: DC

## 2014-04-06 ENCOUNTER — Other Ambulatory Visit: Payer: Self-pay | Admitting: Family Medicine

## 2014-07-12 ENCOUNTER — Other Ambulatory Visit: Payer: Self-pay | Admitting: Family Medicine

## 2014-07-12 ENCOUNTER — Other Ambulatory Visit (INDEPENDENT_AMBULATORY_CARE_PROVIDER_SITE_OTHER): Payer: Medicare Other

## 2014-07-12 DIAGNOSIS — E7800 Pure hypercholesterolemia, unspecified: Secondary | ICD-10-CM

## 2014-07-12 DIAGNOSIS — I10 Essential (primary) hypertension: Secondary | ICD-10-CM

## 2014-07-12 DIAGNOSIS — Z125 Encounter for screening for malignant neoplasm of prostate: Secondary | ICD-10-CM | POA: Diagnosis not present

## 2014-07-12 DIAGNOSIS — E538 Deficiency of other specified B group vitamins: Secondary | ICD-10-CM | POA: Diagnosis not present

## 2014-07-12 LAB — CBC WITH DIFFERENTIAL/PLATELET
Basophils Absolute: 0 10*3/uL (ref 0.0–0.1)
Basophils Relative: 0.4 % (ref 0.0–3.0)
EOS PCT: 2.6 % (ref 0.0–5.0)
Eosinophils Absolute: 0.2 10*3/uL (ref 0.0–0.7)
HEMATOCRIT: 46.1 % (ref 39.0–52.0)
Hemoglobin: 15.7 g/dL (ref 13.0–17.0)
LYMPHS ABS: 1.7 10*3/uL (ref 0.7–4.0)
Lymphocytes Relative: 24.5 % (ref 12.0–46.0)
MCHC: 34.1 g/dL (ref 30.0–36.0)
MCV: 94.1 fl (ref 78.0–100.0)
Monocytes Absolute: 0.6 10*3/uL (ref 0.1–1.0)
Monocytes Relative: 7.9 % (ref 3.0–12.0)
NEUTROS ABS: 4.6 10*3/uL (ref 1.4–7.7)
Neutrophils Relative %: 64.6 % (ref 43.0–77.0)
Platelets: 216 10*3/uL (ref 150.0–400.0)
RBC: 4.9 Mil/uL (ref 4.22–5.81)
RDW: 13.6 % (ref 11.5–15.5)
WBC: 7.1 10*3/uL (ref 4.0–10.5)

## 2014-07-12 LAB — LIPID PANEL
Cholesterol: 102 mg/dL (ref 0–200)
HDL: 35 mg/dL — ABNORMAL LOW (ref >39.00)
LDL CALC: 50 mg/dL (ref 0–99)
NonHDL: 67
Total CHOL/HDL Ratio: 3
Triglycerides: 87 mg/dL (ref 0.0–149.0)
VLDL: 17.4 mg/dL (ref 0.0–40.0)

## 2014-07-12 LAB — BASIC METABOLIC PANEL
BUN: 15 mg/dL (ref 6–23)
CO2: 27 meq/L (ref 19–32)
CREATININE: 0.82 mg/dL (ref 0.40–1.50)
Calcium: 9.7 mg/dL (ref 8.4–10.5)
Chloride: 103 mEq/L (ref 96–112)
GFR: 98.83 mL/min (ref >60.00)
Glucose, Bld: 104 mg/dL — ABNORMAL HIGH (ref 70–99)
Potassium: 3.9 mEq/L (ref 3.5–5.1)
Sodium: 137 mEq/L (ref 135–145)

## 2014-07-12 LAB — PSA, MEDICARE: PSA: 2.46 ng/mL (ref 0.10–4.00)

## 2014-07-12 LAB — TSH: TSH: 1.74 u[IU]/mL (ref 0.35–4.50)

## 2014-07-12 LAB — VITAMIN B12: VITAMIN B 12: 242 pg/mL (ref 211–911)

## 2014-07-18 ENCOUNTER — Encounter: Payer: Self-pay | Admitting: Family Medicine

## 2014-07-19 ENCOUNTER — Other Ambulatory Visit: Payer: Self-pay | Admitting: Family Medicine

## 2014-07-26 ENCOUNTER — Ambulatory Visit (INDEPENDENT_AMBULATORY_CARE_PROVIDER_SITE_OTHER): Payer: Medicare Other | Admitting: Family Medicine

## 2014-07-26 ENCOUNTER — Encounter: Payer: Self-pay | Admitting: Family Medicine

## 2014-07-26 VITALS — BP 120/78 | HR 69 | Temp 97.8°F | Ht 73.5 in | Wt 237.0 lb

## 2014-07-26 DIAGNOSIS — Z0000 Encounter for general adult medical examination without abnormal findings: Secondary | ICD-10-CM | POA: Insufficient documentation

## 2014-07-26 DIAGNOSIS — R739 Hyperglycemia, unspecified: Secondary | ICD-10-CM

## 2014-07-26 DIAGNOSIS — F341 Dysthymic disorder: Secondary | ICD-10-CM

## 2014-07-26 DIAGNOSIS — I639 Cerebral infarction, unspecified: Secondary | ICD-10-CM

## 2014-07-26 DIAGNOSIS — Z1211 Encounter for screening for malignant neoplasm of colon: Secondary | ICD-10-CM

## 2014-07-26 DIAGNOSIS — D126 Benign neoplasm of colon, unspecified: Secondary | ICD-10-CM

## 2014-07-26 DIAGNOSIS — E7800 Pure hypercholesterolemia, unspecified: Secondary | ICD-10-CM

## 2014-07-26 DIAGNOSIS — I10 Essential (primary) hypertension: Secondary | ICD-10-CM

## 2014-07-26 DIAGNOSIS — Z7189 Other specified counseling: Secondary | ICD-10-CM | POA: Insufficient documentation

## 2014-07-26 NOTE — Assessment & Plan Note (Signed)
Discussed decreased added sugars in diet.

## 2014-07-26 NOTE — Assessment & Plan Note (Signed)
Advanced planning - has at home. HCPOA is wife. Does not want prolonged life support. If doesn't have capacity to make decisions would not want artificial prolongation of life. W[facility] to [facility] body to [facility].

## 2014-07-26 NOTE — Assessment & Plan Note (Signed)
Pt denies MDD. Anticipate at least has dysthymia vs adjustment disorder after stroke and worsening vision. Discussed options other than reading (audible books, podcasts, etc) and encouraged he explore these options. I've asked him to touch base with family to see how they see him and his interactions. Not interested in medication. Could consider counseling.

## 2014-07-26 NOTE — Assessment & Plan Note (Signed)
Chronic, stable. Continue current regimen. 

## 2014-07-26 NOTE — Assessment & Plan Note (Signed)

## 2014-07-26 NOTE — Assessment & Plan Note (Signed)
On aspirin 325mg  - rec decrease to 81mg  daily per prior neurologist's recs.

## 2014-07-26 NOTE — Assessment & Plan Note (Signed)
Agrees to [facility] - will order today.

## 2014-07-26 NOTE — Patient Instructions (Addendum)
Bring me copy of advanced directive For ear - try vaseline daily as the pinna is dry. Look into podcasts or books on tape ([email or web address]). Good to see you today, call us with questions. I think you would do fine with low dose aspirin (57m daily). Stool kit today.

## 2014-07-26 NOTE — Assessment & Plan Note (Signed)
Chronic, stable. Good control continue current regimen.

## 2014-07-26 NOTE — Progress Notes (Signed)
BP 120/78 mmHg  Pulse 69  Temp(Src) 97.8 F (36.6 C) (Oral)  Ht 6' 1.5" (1.867 m)  Wt 237 lb (107.502 kg)  BMI 30.84 kg/m2  SpO2 98%   CC: medicare wellness visit  Subjective:    Patient ID: Evan Morales, male    DOB: [birth year], 69 y.o.   MRN: [id number]  HPI: Evan Morales is a 69 y.o. male presenting on 07/26/2014 for Annual Exam   Has noticed apathetic mood since a neurologist told him "you should go home and wait for your next stroke". Underlying vision issues, stroke worsened this. Unable to read or play golf which he previously enjoyed. Doesn't feel depressed or anhedonic. Sports psychologist.  Hearing screen passed.  Recent eye exam at [facility]. No falls in last year. Denies depression/anhedonia, sadness.  Preventative: Colonoscopy 04/2005 - adenomatous polyp. rec rpt 5 yrs. In setting of recent stroke, rec postpone colonoscopy - desires stool kit today although not quite sure he would go through with colonoscopy even if positive. Prostate - always normal. Improved nocturia - doesn't drink fluids after 6pm. Desires to stop screening.  Flu 10/2013 Pneumovax 2013, prevnar declines. Td 2011. zostavax 06/2012.  Advanced planning - has at home. HCPOA is wife. Does not want prolonged life support. If doesn't have capacity to make decisions would not want artificial prolongation of life. Wants to donate body to [place] use discussed Sunscreen use discussed. No changing moles on skin.  Caffeine: 2-3 cups coffee/Morales Lives with wife. (grown children)  Occupation: retired, was Occupational hygienist  Activity: fishing, yardwork  Diet: good water, fruits/vegetables daily  Relevant past medical, surgical, family and social history reviewed and updated as indicated. Interim medical history since our last visit reviewed. Allergies and medications reviewed and updated. Current Outpatient Prescriptions on File Prior to Visit  Medication Sig  . aspirin 325  MG tablet Take 1 tablet (325 mg total) by mouth daily.  Marland Kitchen atorvastatin (LIPITOR) 40 MG tablet Take 1 tablet (40 mg total) by mouth daily.  . hydrochlorothiazide (MICROZIDE) 12.5 MG capsule TAKE 1 CAPSULE BY MOUTH ONCE A Morales  . quinapril (ACCUPRIL) 40 MG tablet TAKE 1 TABLET BY MOUTH EVERY Morales  . cyanocobalamin 500 MCG tablet Take 500 mcg by mouth daily. Vitamin b12  . hydrocortisone-pramoxine (ANALPRAM-HC) 2.5-1 % rectal cream Place 1 application rectally 2 (two) times daily as needed for hemorrhoids.  . polyethylene glycol powder (GLYCOLAX/MIRALAX) powder Take 1/2 to a whole capful mixed in 8 oz fluid as needed for constipation, hold for diarrhea. (Patient taking differently: Take 0.5 Containers by mouth daily as needed for moderate constipation. )   No current facility-administered medications on file prior to visit.    Review of Systems  Constitutional: Negative for fever, chills, activity change, appetite change, fatigue and unexpected weight change.  HENT: Negative for hearing loss.   Eyes: Negative for visual disturbance.  Respiratory: Negative for cough, chest tightness, shortness of breath and wheezing.   Cardiovascular: Negative for chest pain, palpitations and leg swelling.  Gastrointestinal: Negative for nausea, vomiting, abdominal pain, diarrhea, constipation, blood in stool and abdominal distention.  Genitourinary: Negative for hematuria and difficulty urinating.  Musculoskeletal: Negative for myalgias, arthralgias and neck pain.  Skin: Negative for rash.  Neurological: Negative for dizziness, seizures, syncope and headaches.  Hematological: Negative for adenopathy. Bruises/bleeds easily.  Psychiatric/Behavioral: Negative for dysphoric mood. The patient is not nervous/anxious.    Per HPI unless specifically indicated above  Objective:    BP 120/78 mmHg  Pulse 69  Temp(Src) 97.8 F (36.6 C) (Oral)  Ht 6' 1.5" (1.867 m)  Wt 237 lb (107.502 kg)  BMI 30.84 kg/m2  SpO2  98%  Wt Readings from Last 3 Encounters:  07/26/14 237 lb (107.502 kg)  07/15/13 235 lb 8 oz (106.822 kg)  07/08/13 232 lb 8 oz (105.461 kg)    Physical Exam  Constitutional: He is oriented to person, place, and time. He appears well-developed and well-nourished. No distress.  HENT:  Head: Normocephalic and atraumatic.  Right Ear: Hearing, tympanic membrane, external ear and ear canal normal.  Left Ear: Hearing, tympanic membrane, external ear and ear canal normal.  Nose: Nose normal.  Mouth/Throat: Uvula is midline, oropharynx is clear and moist and mucous membranes are normal. No oropharyngeal exudate, posterior oropharyngeal edema or posterior oropharyngeal erythema.  Eyes: Conjunctivae and EOM are normal. Pupils are equal, round, and reactive to light. No scleral icterus.  Neck: Normal range of motion. Neck supple. Carotid bruit is not present. No thyromegaly present.  Cardiovascular: Normal rate, regular rhythm, normal heart sounds and intact distal pulses.   No murmur heard. Pulses:      Radial pulses are 2+ on the right side, and 2+ on the left side.  Pulmonary/Chest: Effort normal and breath sounds normal. No respiratory distress. He has no wheezes. He has no rales.  Abdominal: Soft. Bowel sounds are normal. He exhibits no distension and no mass. There is no tenderness. There is no rebound and no guarding.  Musculoskeletal: Normal range of motion. He exhibits no edema.  Lymphadenopathy:    He has no cervical adenopathy.  Neurological: He is alert and oriented to person, place, and time.  CN grossly intact, station and gait intact Recall 3/3  Calculation 5/5 serial 7s  Skin: Skin is warm and dry. No rash noted.  Psychiatric: He has a normal mood and affect. His behavior is normal. Judgment and thought content normal.  Nursing note and vitals reviewed.  Results for orders placed or performed in visit on 07/12/14  Lipid panel  Result Value Ref Range   Cholesterol 102 0 - 200  mg/dL   Triglycerides 87.0 0.0 - 149.0 mg/dL   HDL 35.00 (L) >39.00 mg/dL   VLDL 17.4 0.0 - 40.0 mg/dL   LDL Cholesterol 50 0 - 99 mg/dL   Total CHOL/HDL Ratio 3    NonHDL 67.00   TSH  Result Value Ref Range   TSH 1.74 0.35 - 4.50 uIU/mL  PSA, Medicare  Result Value Ref Range   PSA 2.46 0.10 - 4.00 ng/ml  CBC with Differential/Platelet  Result Value Ref Range   WBC 7.1 4.0 - 10.5 K/uL   RBC 4.90 4.22 - 5.81 Mil/uL   Hemoglobin 15.7 13.0 - 17.0 g/dL   HCT 46.1 39.0 - 52.0 %   MCV 94.1 78.0 - 100.0 fl   MCHC 34.1 30.0 - 36.0 g/dL   RDW 13.6 11.5 - 15.5 %   Platelets 216.0 150.0 - 400.0 K/uL   Neutrophils Relative % 64.6 43.0 - 77.0 %   Lymphocytes Relative 24.5 12.0 - 46.0 %   Monocytes Relative 7.9 3.0 - 12.0 %   Eosinophils Relative 2.6 0.0 - 5.0 %   Basophils Relative 0.4 0.0 - 3.0 %   Neutro Abs 4.6 1.4 - 7.7 K/uL   Lymphs Abs 1.7 0.7 - 4.0 K/uL   Monocytes Absolute 0.6 0.1 - 1.0 K/uL   Eosinophils Absolute 0.2 0.0 -  0.7 K/uL   Basophils Absolute 0.0 0.0 - 0.1 K/uL  Basic metabolic panel  Result Value Ref Range   Sodium 137 135 - 145 mEq/L   Potassium 3.9 3.5 - 5.1 mEq/L   Chloride 103 96 - 112 mEq/L   CO2 27 19 - 32 mEq/L   Glucose, Bld 104 (H) 70 - 99 mg/dL   BUN 15 6 - 23 mg/dL   Creatinine, Ser 0.82 0.40 - 1.50 mg/dL   Calcium 9.7 8.4 - 10.5 mg/dL   GFR 98.83 >60.00 mL/min  Vitamin B12  Result Value Ref Range   Vitamin B-12 242 211 - 911 pg/mL      Assessment & Plan:   Problem List Items Addressed This Visit    Advanced care planning/counseling discussion    Advanced planning - has at home. HCPOA is wife. Does not want prolonged life support. If doesn't have capacity to make decisions would not want artificial prolongation of life. Wants to donate body to [place] to [facility] - will order today.      CVA (cerebral vascular accident)    On aspirin 363m - rec decrease to 882mdaily per prior neurologist's  recs.       Dysthymia    Pt denies MDD. Anticipate at least has dysthymia vs adjustment disorder after stroke and worsening vision. Discussed options other than reading (audible books, podcasts, etc) and encouraged he explore these options. I've asked him to touch base with family to see how they see him and his interactions. Not interested in medication. Could consider counseling.      Essential hypertension    Chronic, stable. Continue current regimen.      Health maintenance examination    Preventative protocols reviewed and updated unless pt declined. Discussed healthy diet and lifestyle.       HYPERCHOLESTEROLEMIA    Chronic, stable. Good control continue current regimen.      Hyperglycemia    Discussed decreased added sugars in diet.      Medicare annual wellness visit, subsequent - Primary    I have personally reviewed the Medicare Annual Wellness questionnaire and have noted 1. The patient's medical and social history 2. Their use of alcohol, tobacco or illicit drugs 3. Their current medications and supplements 4. The patient's functional ability including ADL's, fall risks, home safety risks and hearing or visual impairment. Cognitive function has been assessed and addressed as indicated.  5. Diet and physical activity 6. Evidence for depression or mood disorders The patients weight, height, BMI have been recorded in the chart. I have made referrals, counseling and provided education to the patient based on review of the above and I have provided the pt with a written personalized care plan for preventive services. Provider list updated.. See scanned questionairre as needed for further documentation. Reviewed preventative protocols and updated unless pt declined.        Other Visit Diagnoses    Special screening for malignant neoplasms, colon        Relevant Orders    Fecal occult blood, imunochemical        Follow up plan: Return in about 1 year (around  07/26/2015), or as needed, for medicare wellness.

## 2014-07-26 NOTE — Progress Notes (Signed)
Pre visit review using our clinic review tool, if applicable. No additional management support is needed unless otherwise documented below in the visit note. 

## 2014-07-26 NOTE — Assessment & Plan Note (Signed)
Preventative protocols reviewed and updated unless pt declined. Discussed healthy diet and lifestyle.  

## 2014-08-02 ENCOUNTER — Other Ambulatory Visit: Payer: Self-pay | Admitting: Family Medicine

## 2014-09-11 ENCOUNTER — Other Ambulatory Visit: Payer: Self-pay | Admitting: Family Medicine

## 2014-10-18 ENCOUNTER — Other Ambulatory Visit: Payer: Self-pay | Admitting: Family Medicine

## 2014-10-20 ENCOUNTER — Ambulatory Visit (INDEPENDENT_AMBULATORY_CARE_PROVIDER_SITE_OTHER): Payer: Medicare Other

## 2014-10-20 DIAGNOSIS — Z23 Encounter for immunization: Secondary | ICD-10-CM

## 2015-02-01 IMAGING — CT CT HEAD W/O CM
1 series · 16 of 30 positions shown, 20 images · non-contrast
Comparison: None.

CLINICAL DATA: Paresthesias of the right face and hand.

CT HEAD WITHOUT CONTRAST
TECHNIQUE: Contiguous axial images were obtained from the base of
the skull through the vertex without contrast.

[Series 2: head_seq -c 4.5 h37s st · axial · 0.44mm/px · z∈[-139,+5]mm · 16 of 36 slices shown, 20 images]
[im 2/36  brain]
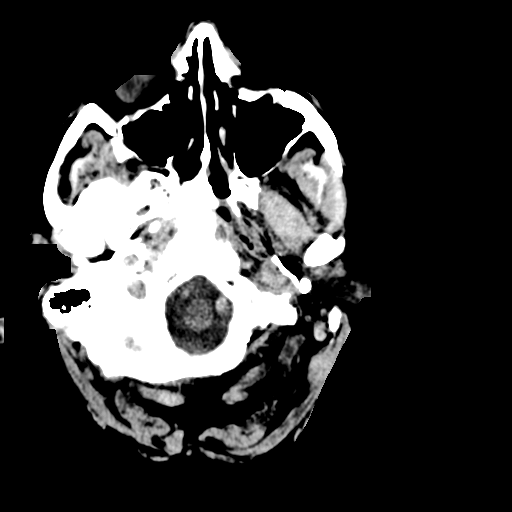
[im 2/36  bone]
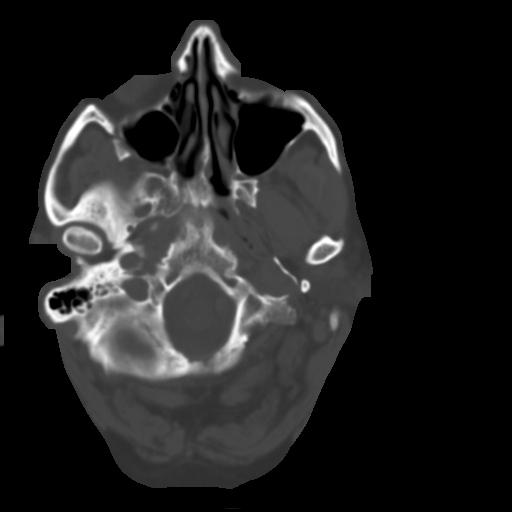
[im 4/36  brain]
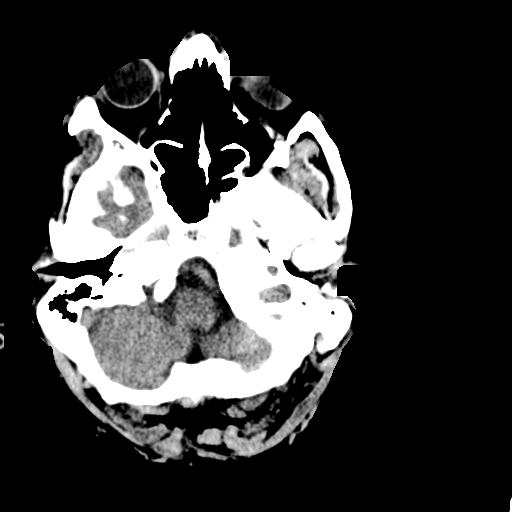
[im 7/36  brain]
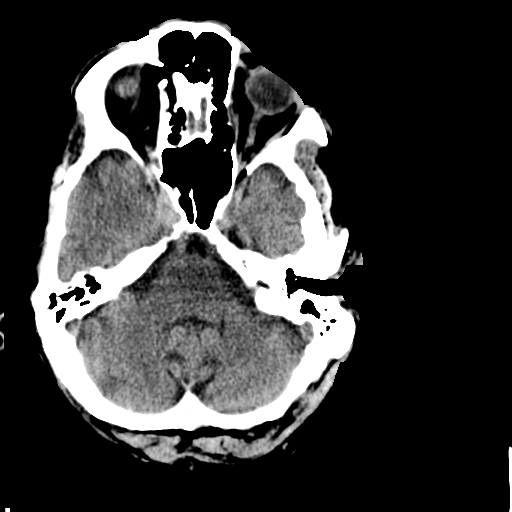
[im 9/36  brain]
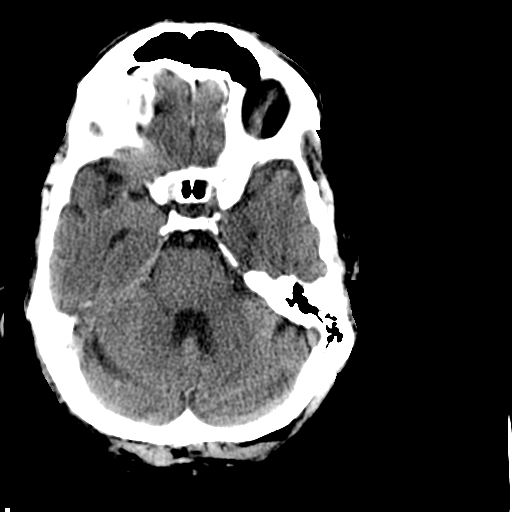
[im 10/36  brain]
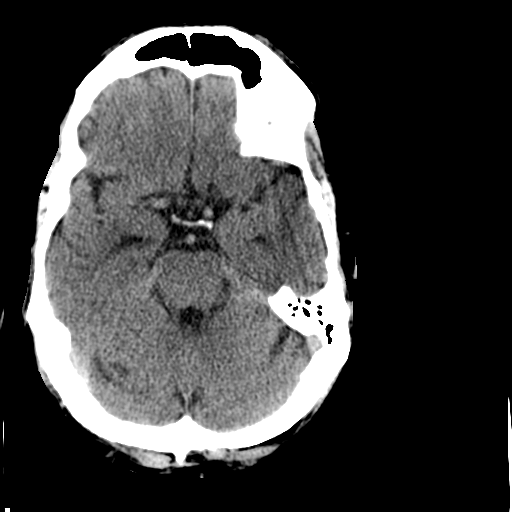
[im 10/36  bone]
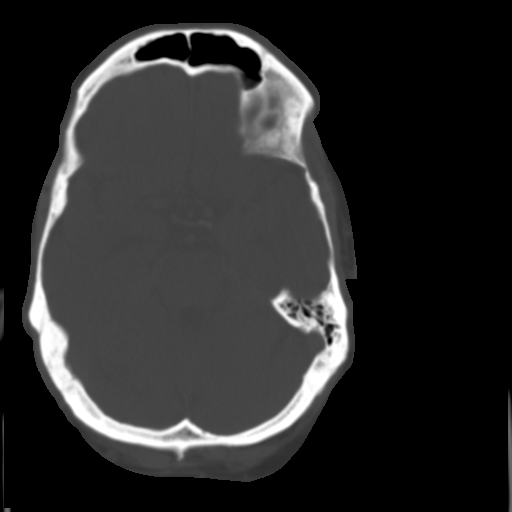
[im 13/36  brain]
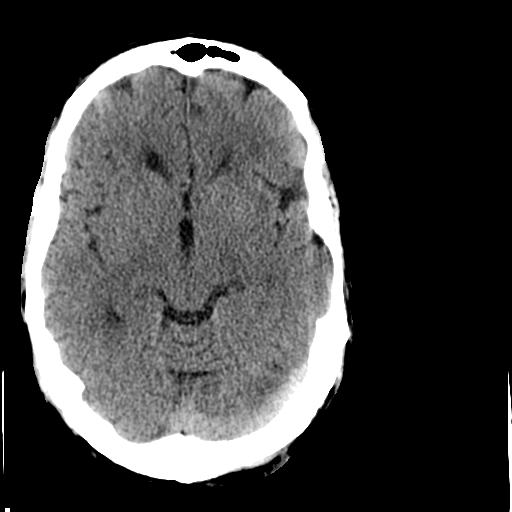
[im 15/36  brain]
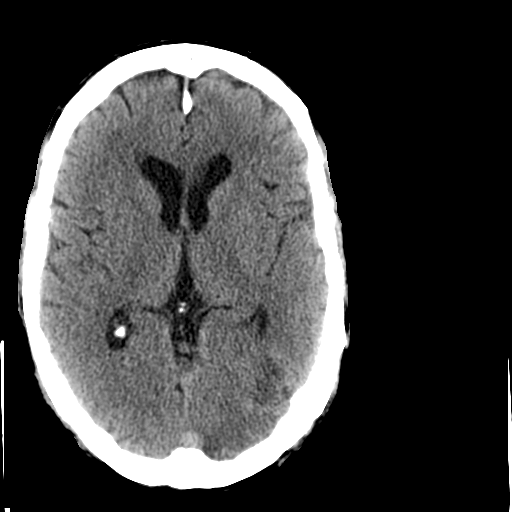
[im 17/36  brain]
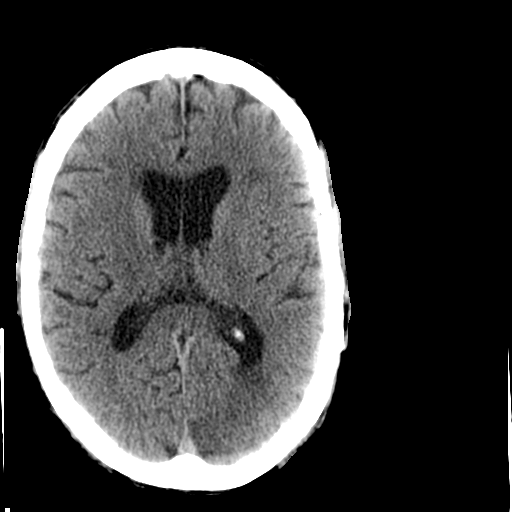
[im 19/36  brain]
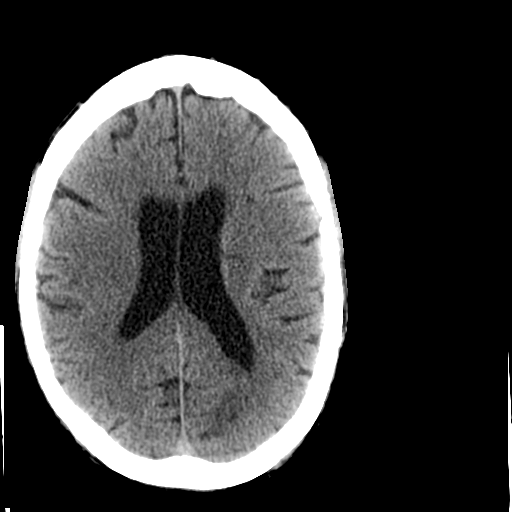
[im 19/36  bone]
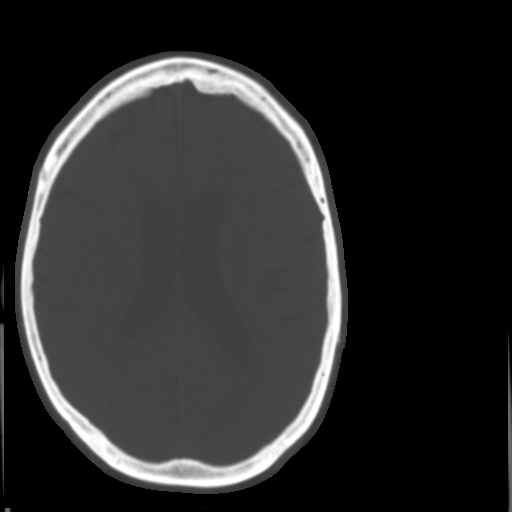
[im 21/36  brain]
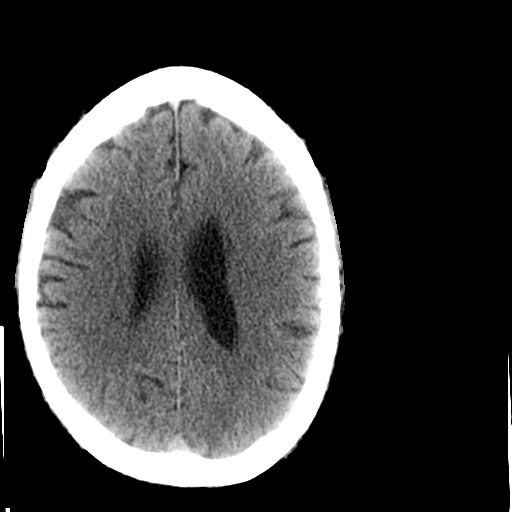
[im 23/36  brain]
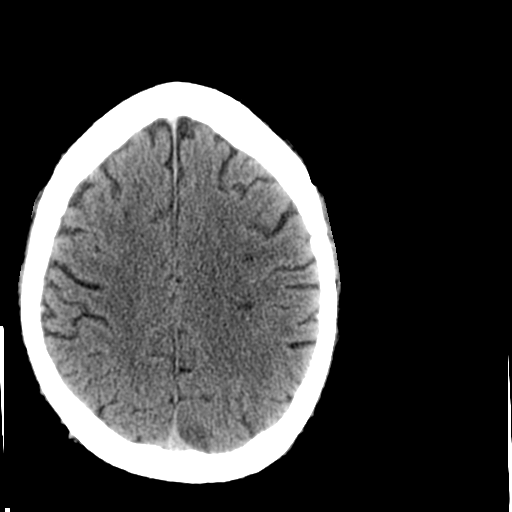
[im 26/36  brain]
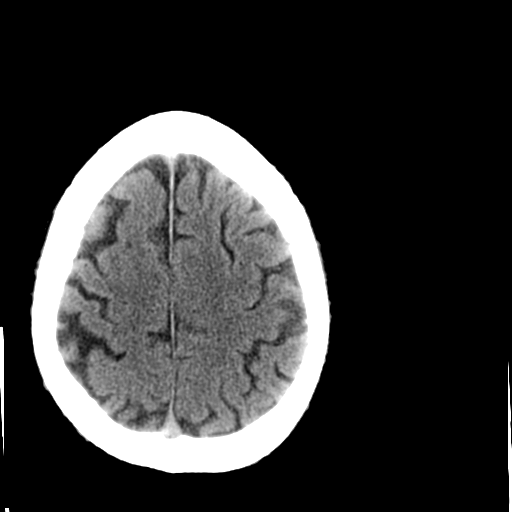
[im 27/36  brain]
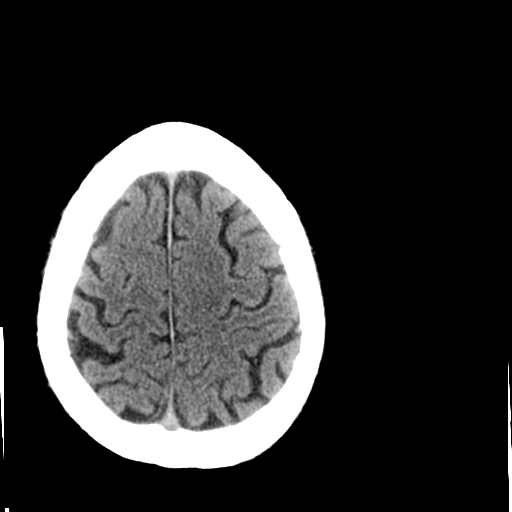
[im 27/36  bone]
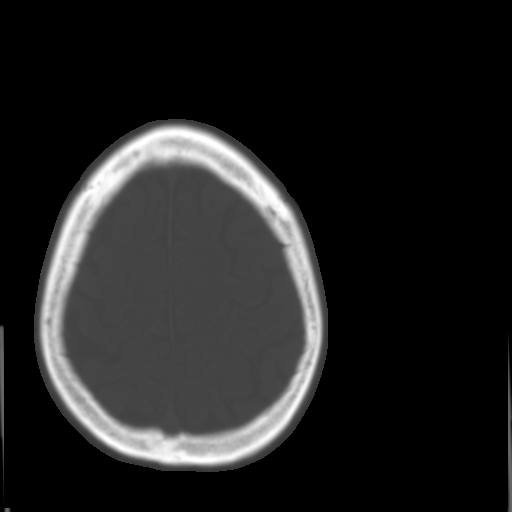
[im 29/36  brain]
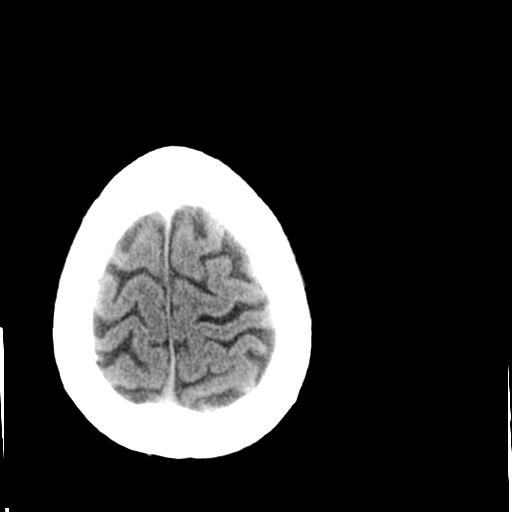
[im 32/36  brain]
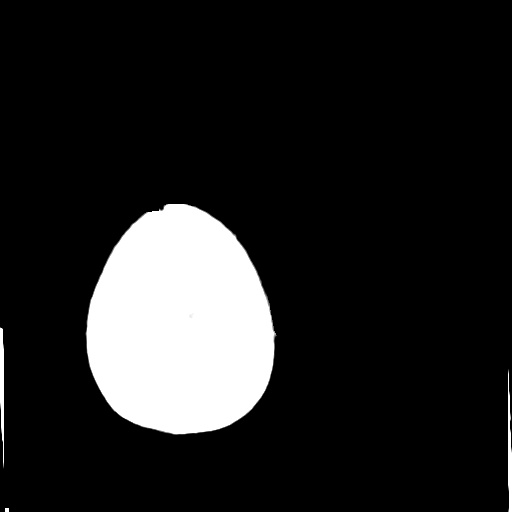
[im 34/36  brain]
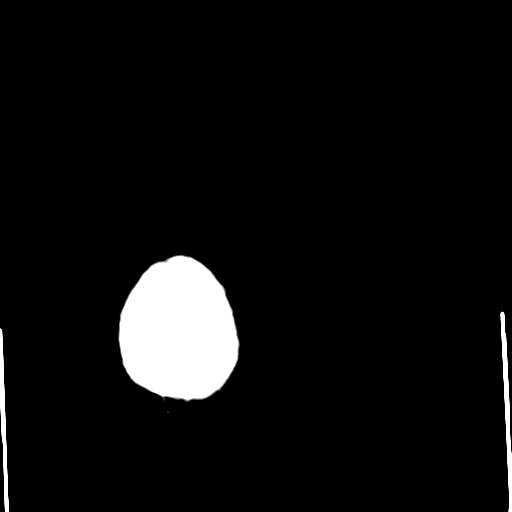

[16 of 30 positions shown; findings below may reference images not displayed]

FINDINGS: There is abnormal low density in the left occipital
region suggesting subacute infarction.  No evidence of hemorrhage.
There are mild chronic appearing small vessel changes within the
hemispheric white matter.  The remainder the brain appears normal.
No mass lesion, hydrocephalus or extra-axial collection.  No
calvarial abnormality .  No inflammatory sinus disease.
IMPRESSION: Low density in the left occipital lobe most consistent with acute
or subacute infarction.  No evidence of hemorrhage.

## 2015-04-17 DIAGNOSIS — Z947 Corneal transplant status: Secondary | ICD-10-CM | POA: Diagnosis not present

## 2015-04-17 DIAGNOSIS — H4031X4 Glaucoma secondary to eye trauma, right eye, indeterminate stage: Secondary | ICD-10-CM | POA: Diagnosis not present

## 2015-04-17 DIAGNOSIS — H40012 Open angle with borderline findings, low risk, left eye: Secondary | ICD-10-CM | POA: Diagnosis not present

## 2015-04-18 ENCOUNTER — Telehealth: Payer: Self-pay | Admitting: Family Medicine

## 2015-04-18 ENCOUNTER — Ambulatory Visit (INDEPENDENT_AMBULATORY_CARE_PROVIDER_SITE_OTHER): Payer: PPO | Admitting: Family Medicine

## 2015-04-18 ENCOUNTER — Encounter: Payer: Self-pay | Admitting: Family Medicine

## 2015-04-18 VITALS — BP 122/68 | HR 65 | Temp 98.7°F | Ht 73.5 in | Wt 234.5 lb

## 2015-04-18 DIAGNOSIS — H8110 Benign paroxysmal vertigo, unspecified ear: Secondary | ICD-10-CM | POA: Insufficient documentation

## 2015-04-18 DIAGNOSIS — H8113 Benign paroxysmal vertigo, bilateral: Secondary | ICD-10-CM | POA: Diagnosis not present

## 2015-04-18 MED ORDER — MECLIZINE HCL 25 MG PO TABS: 25.0000 mg | ORAL_TABLET | Freq: Three times a day (TID) | ORAL | Status: DC | PRN

## 2015-04-18 NOTE — Telephone Encounter (Signed)
I will see him then

## 2015-04-18 NOTE — Assessment & Plan Note (Signed)
Symptoms and exam consistent with BPV -brief episodes and reassuring exam   I think you have positional vertigo  Do not change position quickly -move slow -especially your head  Roll over in bed very slowly if you have to  Try the meclizine if you feel you need it for dizziness or nausea  If symptoms worsen and persist- we need to know and if they are severe-get to the ED  Given meclizine for prn use if needed  Update if worse or persistent

## 2015-04-18 NOTE — Patient Instructions (Signed)
I think you have positional vertigo  Do not change position quickly -move slow -especially your head  Roll over in bed very slowly if you have to  Try the meclizine if you feel you need it for dizziness or nausea  If symptoms worsen and persist- we need to know and if they are severe-get to the ED   Keep [place] posted

## 2015-04-18 NOTE — Progress Notes (Signed)
Subjective:    Patient ID: Evan Morales, male    DOB: 05-04-1945, 70 y.o.   MRN: [id number]  HPI Here with dizziness   Was lying down - flipped over in bed and felt spinning sensation - happened yesterday and then again today  He did get some dry heaves  Lasted a minute or less  Then back to normal   This has never happened before   Right now is at his baseline (past CVA)- has some residual numbness in R fingers - overall made a good recovery   No nasal congestion  No ear pain or pressure  No recent travel or altitude change   Has had some chest congestion  Perhaps mild pnd - chronic   No headache   Patient Active Problem List   Diagnosis Date Noted  . Advanced care planning/counseling discussion 07/26/2014  . Health maintenance examination 07/26/2014  . Dysthymia 07/26/2014  . Medicare annual wellness visit, subsequent 07/13/2012  . Constipation 04/09/2012  . Vision loss, bilateral 04/06/2012  . Occlusion and stenosis of carotid artery with cerebral infarction 04/06/2012  . CVA (cerebral vascular accident) ([place]) 03/27/2012  . Hyperglycemia 07/05/2010  . HYPERCHOLESTEROLEMIA 05/04/2009  . ECZEMA 05/05/2007  . Essential hypertension 05/13/2006  . COLONIC POLYPS, ADENOMATOUS 05/15/2005   Past Medical History  Diagnosis Date  . Hypertension   . 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  Marland Kitchen History of colon polyps 05/15/2005    tubular adenomas polyps  . CVA (cerebral vascular accident) ([place]) 03/27/2012    left occipital lobe - acute or subacute infarction. No hemorrhage. residual numbness R index and middle finger and R periorally, residual memory loss  . Dyslipidemia   . Degenerative arthritis    Past Surgical History  Procedure Laterality Date  . 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 Esophagogastroduodenoscopy  05/24/2005    mild erythema in antrum  . Cataract extraction  2013   left   Social History  Substance Use Topics  . Smoking status: Never Smoker   . Smokeless tobacco: Former Systems developer    Types: Chew  . Alcohol Use: No   Family History  Problem Relation Age of Onset  . Cancer Mother     male  . Alzheimer's disease Mother   . Glaucoma Father   . Coronary artery disease Neg Hx   . Stroke Neg Hx   . Diabetes Neg Hx   . Colon cancer Neg Hx    No Known Allergies Current Outpatient Prescriptions on File Prior to Visit  Medication Sig Dispense Refill  . aspirin 325 MG tablet Take 1 tablet (325 mg total) by mouth daily.    Marland Kitchen atorvastatin (LIPITOR) 40 MG tablet TAKE 1 TABLET (40 MG TOTAL) BY MOUTH DAILY. 90 tablet 3  . cyanocobalamin 500 MCG tablet Take 500 mcg by mouth daily. Vitamin b12    . hydrochlorothiazide (MICROZIDE) 12.5 MG capsule TAKE 1 CAPSULE BY MOUTH ONCE A DAY 90 capsule 3  . hydrocortisone-pramoxine (ANALPRAM-HC) 2.5-1 % rectal cream Place 1 application rectally 2 (two) times daily as needed for hemorrhoids.    . polyethylene glycol powder (GLYCOLAX/MIRALAX) powder Take 1/2 to a whole capful mixed in 8 oz fluid as needed for constipation, hold for diarrhea. (Patient taking differently: Take 0.5 Containers by mouth daily as needed for moderate constipation. ) 3350 g 1  . quinapril (  ACCUPRIL) 40 MG tablet TAKE 1 TABLET BY MOUTH EVERY DAY 90 tablet 3   No current facility-administered medications on file prior to visit.     Review of Systems Review of Systems  Constitutional: Negative for fever, appetite change, fatigue and unexpected weight change.  ENt neg for ear or sinus pain  Eyes: Negative for pain and visual disturbance.  Respiratory: Negative for cough and shortness of breath.   Cardiovascular: Negative for cp or palpitations    Gastrointestinal: Negative for nausea, diarrhea and constipation.  Genitourinary: Negative for urgency and frequency.  Skin: Negative for pallor or rash   Neurological: Negative for weakness,  light-headedness, numbness (aside from baseline)  and headaches. pos for dizziness when rolling over in bed  Hematological: Negative for adenopathy. Does not bruise/bleed easily.  Psychiatric/Behavioral: Negative for dysphoric mood. The patient is not nervous/anxious.         Objective:   Physical Exam  Constitutional: He is oriented to person, place, and time. He appears well-developed and well-nourished. No distress.  Well appearing   HENT:  Head: Normocephalic and atraumatic.  Right Ear: External ear normal.  Left Ear: External ear normal.  Nose: Nose normal.  Mouth/Throat: Oropharynx is clear and moist. No oropharyngeal exudate.  No sinus tenderness No temporal tenderness  No TMJ tenderness  Eyes: Conjunctivae and EOM are normal. Pupils are equal, round, and reactive to light. Right eye exhibits no discharge. Left eye exhibits no discharge. No scleral icterus.  2-3 beats of horizontal nystagmus bilaterally  Neck: Normal range of motion and full passive range of motion without pain. Neck supple. No JVD present. Carotid bruit is not present. No tracheal deviation present. No thyromegaly present.  Cardiovascular: Normal rate, regular rhythm and normal heart sounds.   No murmur heard. Pulmonary/Chest: Effort normal and breath sounds normal. No respiratory distress. He has no wheezes. He has no rales.  Abdominal: Soft. Bowel sounds are normal. He exhibits no distension and no mass. There is no tenderness.  Musculoskeletal: He exhibits no edema or tenderness.  Lymphadenopathy:    He has no cervical adenopathy.  Neurological: He is alert and oriented to person, place, and time. He has normal strength and normal reflexes. He displays no atrophy and no tremor. No cranial nerve deficit or sensory deficit. He exhibits normal muscle tone. He displays a negative Romberg sign. Coordination and gait normal.  No focal cerebellar signs   Skin: Skin is warm and dry. No rash noted. No pallor.    Psychiatric: He has a normal mood and affect. His behavior is normal. Thought content normal.          Assessment & Plan:   Problem List Items Addressed This Visit      Nervous and Auditory   Benign paroxysmal positional vertigo - Primary    Symptoms and exam consistent with BPV -brief episodes and reassuring exam   I think you have positional vertigo  Do not change position quickly -move slow -especially your head  Roll over in bed very slowly if you have to  Try the meclizine if you feel you need it for dizziness or nausea  If symptoms worsen and persist- we need to know and if they are severe-get to the ED  Given meclizine for prn use if needed  Update if worse or persistent

## 2015-04-18 NOTE — Telephone Encounter (Signed)
Patient Name: Evan Morales LDS DOB: 06/03/1945 Initial Comment Caller states having dizziness and disoriented in the morning Nurse Assessment Nurse: Roosvelt Maser, RN, [facility] Date/Time (Eastern Time): 04/18/2015 8:14:52 AM Confirm and document reason for call. If symptomatic, describe symptoms. You must click the next button to save text entered. ---pt states when he first woke up this morning he seemed dizzy and disoriented last about two minutes and now he feels fine. Has the patient traveled out of the country within the last 30 days? ---Not Applicable Does the patient have any new or worsening symptoms? ---Yes Will a triage be completed? ---Yes Related visit to physician within the last 2 weeks? ---No Does the PT have any chronic conditions? (i.e. diabetes, asthma, etc.) ---Yes List chronic conditions. ---cva, htn Is this a behavioral health or substance abuse call? ---No Guidelines Guideline Title Affirmed Question Affirmed Notes Dizziness - Vertigo Vomiting occurs with dizziness Final Disposition User See Physician within 24 Hours Roosvelt Maser, RN, [facility] Referrals REFERRED TO PCP OFFICE Disagree/Comply: Comply   appt scheduled 04/18/15 at 1415 with dr tower

## 2015-04-18 NOTE — Telephone Encounter (Signed)
Pt has appt 04/18/15 at 2:15 with Dr Glori Bickers.

## 2015-04-18 NOTE — Progress Notes (Signed)
Pre visit review using our clinic review tool, if applicable. No additional management support is needed unless otherwise documented below in the visit note. 

## 2015-06-19 DIAGNOSIS — H40012 Open angle with borderline findings, low risk, left eye: Secondary | ICD-10-CM | POA: Diagnosis not present

## 2015-06-20 DIAGNOSIS — H40012 Open angle with borderline findings, low risk, left eye: Secondary | ICD-10-CM | POA: Diagnosis present

## 2015-06-20 DIAGNOSIS — H4031X4 Glaucoma secondary to eye trauma, right eye, indeterminate stage: Secondary | ICD-10-CM | POA: Diagnosis present

## 2015-07-17 DIAGNOSIS — H4031X4 Glaucoma secondary to eye trauma, right eye, indeterminate stage: Secondary | ICD-10-CM | POA: Diagnosis not present

## 2015-07-17 DIAGNOSIS — H40012 Open angle with borderline findings, low risk, left eye: Secondary | ICD-10-CM | POA: Diagnosis not present

## 2015-07-23 ENCOUNTER — Other Ambulatory Visit: Payer: Self-pay | Admitting: Family Medicine

## 2015-07-23 DIAGNOSIS — I639 Cerebral infarction, unspecified: Secondary | ICD-10-CM

## 2015-07-23 DIAGNOSIS — D126 Benign neoplasm of colon, unspecified: Secondary | ICD-10-CM

## 2015-07-23 DIAGNOSIS — I10 Essential (primary) hypertension: Secondary | ICD-10-CM

## 2015-07-23 DIAGNOSIS — E538 Deficiency of other specified B group vitamins: Secondary | ICD-10-CM

## 2015-07-23 DIAGNOSIS — E7800 Pure hypercholesterolemia, unspecified: Secondary | ICD-10-CM

## 2015-07-23 DIAGNOSIS — Z1159 Encounter for screening for other viral diseases: Secondary | ICD-10-CM

## 2015-07-23 DIAGNOSIS — R7989 Other specified abnormal findings of blood chemistry: Secondary | ICD-10-CM

## 2015-07-24 ENCOUNTER — Other Ambulatory Visit (INDEPENDENT_AMBULATORY_CARE_PROVIDER_SITE_OTHER): Payer: PPO

## 2015-07-24 DIAGNOSIS — Z1159 Encounter for screening for other viral diseases: Secondary | ICD-10-CM | POA: Diagnosis not present

## 2015-07-24 DIAGNOSIS — I10 Essential (primary) hypertension: Secondary | ICD-10-CM

## 2015-07-24 DIAGNOSIS — E7800 Pure hypercholesterolemia, unspecified: Secondary | ICD-10-CM | POA: Diagnosis not present

## 2015-07-24 DIAGNOSIS — E538 Deficiency of other specified B group vitamins: Secondary | ICD-10-CM | POA: Diagnosis not present

## 2015-07-24 LAB — VITAMIN B12: Vitamin B-12: 202 pg/mL — ABNORMAL LOW (ref 211–911)

## 2015-07-24 LAB — BASIC METABOLIC PANEL
BUN: 14 mg/dL (ref 6–23)
CALCIUM: 9.5 mg/dL (ref 8.4–10.5)
CO2: 31 mEq/L (ref 19–32)
Chloride: 102 mEq/L (ref 96–112)
Creatinine, Ser: 0.92 mg/dL (ref 0.40–1.50)
GFR: 86.29 mL/min (ref >60.00)
GLUCOSE: 103 mg/dL — AB (ref 70–99)
POTASSIUM: 4.1 meq/L (ref 3.5–5.1)
Sodium: 138 mEq/L (ref 135–145)

## 2015-07-24 LAB — LIPID PANEL
CHOLESTEROL: 88 mg/dL (ref 0–200)
HDL: 36.1 mg/dL — ABNORMAL LOW (ref >39.00)
LDL CALC: 41 mg/dL (ref 0–99)
NonHDL: 52.03
TRIGLYCERIDES: 55 mg/dL (ref 0.0–149.0)
Total CHOL/HDL Ratio: 2
VLDL: 11 mg/dL (ref 0.0–40.0)

## 2015-07-25 LAB — HEPATITIS C ANTIBODY: HCV Ab: NEGATIVE

## 2015-07-27 ENCOUNTER — Ambulatory Visit (INDEPENDENT_AMBULATORY_CARE_PROVIDER_SITE_OTHER): Payer: PPO | Admitting: Family Medicine

## 2015-07-27 ENCOUNTER — Encounter: Payer: Self-pay | Admitting: Family Medicine

## 2015-07-27 VITALS — BP 100/78 | HR 66 | Temp 97.5°F | Ht 72.5 in | Wt 228.1 lb

## 2015-07-27 DIAGNOSIS — I6523 Occlusion and stenosis of bilateral carotid arteries: Secondary | ICD-10-CM

## 2015-07-27 DIAGNOSIS — E669 Obesity, unspecified: Secondary | ICD-10-CM

## 2015-07-27 DIAGNOSIS — R739 Hyperglycemia, unspecified: Secondary | ICD-10-CM

## 2015-07-27 DIAGNOSIS — I639 Cerebral infarction, unspecified: Secondary | ICD-10-CM

## 2015-07-27 DIAGNOSIS — E7800 Pure hypercholesterolemia, unspecified: Secondary | ICD-10-CM

## 2015-07-27 DIAGNOSIS — Z7189 Other specified counseling: Secondary | ICD-10-CM

## 2015-07-27 DIAGNOSIS — D126 Benign neoplasm of colon, unspecified: Secondary | ICD-10-CM | POA: Diagnosis not present

## 2015-07-27 DIAGNOSIS — Z0000 Encounter for general adult medical examination without abnormal findings: Secondary | ICD-10-CM | POA: Diagnosis not present

## 2015-07-27 DIAGNOSIS — H543 Unqualified visual loss, both eyes: Secondary | ICD-10-CM

## 2015-07-27 DIAGNOSIS — E538 Deficiency of other specified B group vitamins: Secondary | ICD-10-CM | POA: Diagnosis not present

## 2015-07-27 DIAGNOSIS — I10 Essential (primary) hypertension: Secondary | ICD-10-CM | POA: Diagnosis not present

## 2015-07-27 DIAGNOSIS — E66811 Obesity, class 1: Secondary | ICD-10-CM

## 2015-07-27 DIAGNOSIS — F341 Dysthymic disorder: Secondary | ICD-10-CM

## 2015-07-27 DIAGNOSIS — Z23 Encounter for immunization: Secondary | ICD-10-CM | POA: Diagnosis not present

## 2015-07-27 DIAGNOSIS — E663 Overweight: Secondary | ICD-10-CM | POA: Insufficient documentation

## 2015-07-27 MED ORDER — VITAMIN B-12 1000 MCG PO TABS: 1000.0000 ug | ORAL_TABLET | Freq: Every day | ORAL | Status: DC

## 2015-07-27 MED ORDER — CYANOCOBALAMIN 1000 MCG/ML IJ SOLN
1000.0000 ug | Freq: Once | INTRAMUSCULAR | Status: AC
Start: 2015-07-27 — End: 2015-07-27
  Administered 2015-07-27: 1000 ug via INTRAMUSCULAR

## 2015-07-27 MED ORDER — ATORVASTATIN CALCIUM 20 MG PO TABS: 20.0000 mg | ORAL_TABLET | Freq: Every day | ORAL | Status: DC

## 2015-07-27 NOTE — Addendum Note (Signed)
Addended by: Inocencio Homes on: 07/27/2015 03:57 PM   Modules accepted: Orders

## 2015-07-27 NOTE — Assessment & Plan Note (Signed)
Pt declines returning for colonoscopy at this time. Agrees to cologaurd

## 2015-07-27 NOTE — Patient Instructions (Addendum)
Blood pressure a bit low today - start monitoring at home. If consistently remaining <110/70, decrease quinapril to 1/2 tablet daily (20mg  total). Continue hydrochlorothiazide. We will sign you up for cologuard.  Check at home for advanced directive and bring me copy to update your chart. prevnar today b12 shot today. Start 1065mcg b12 over the counter, we will recheck levels in 6 months Decrease lipitor to 20mg  daily - new dose at pharmacy.  Choose complex carbs when eating carbs.  Return in 6 months for follow up visit. Good to see you today, call us with questions.  Health Maintenance, Male A healthy lifestyle and preventative care can promote health and wellness.  Maintain regular health, dental, and eye exams.  Eat a healthy diet. Foods like vegetables, fruits, whole grains, low-fat dairy products, and lean protein foods contain the nutrients you need and are low in calories. Decrease your intake of foods high in solid fats, added sugars, and salt. Get information about a proper diet from your health care provider, if necessary.  Regular physical exercise is one of the most important things you can do for your health. Most adults should get at least 150 minutes of moderate-intensity exercise (any activity that increases your heart rate and causes you to sweat) each week. In addition, most adults need muscle-strengthening exercises on 2 or more days a week.   Maintain a healthy weight. The body mass index (BMI) is a screening tool to identify possible weight problems. It provides an estimate of body fat based on height and weight. Your health care provider can find your BMI and can help you achieve or maintain a healthy weight. For males 20 years and older:  A BMI below 18.5 is considered underweight.  A BMI of 18.5 to 24.9 is normal.  A BMI of 25 to 29.9 is considered overweight.  A BMI of 30 and above is considered obese.  Maintain normal blood lipids and cholesterol by exercising  and minimizing your intake of saturated fat. Eat a balanced diet with plenty of fruits and vegetables. Blood tests for lipids and cholesterol should begin at age 49 and be repeated every 5 years. If your lipid or cholesterol levels are high, you are over age 65, or you are at high risk for heart disease, you may need your cholesterol levels checked more frequently.Ongoing high lipid and cholesterol levels should be treated with medicines if diet and exercise are not working.  If you smoke, find out from your health care provider how to quit. If you do not use tobacco, do not start.  Lung cancer screening is recommended for adults aged 92-80 years who are at high risk for developing lung cancer because of a history of smoking. A yearly low-dose CT scan of the lungs is recommended for people who have at least a 30-pack-year history of smoking and are current smokers or have quit within the past 15 years. A pack year of smoking is smoking an average of 1 pack of cigarettes a day for 1 year (for example, a 30-pack-year history of smoking could mean smoking 1 pack a day for 30 years or 2 packs a day for 15 years). Yearly screening should continue until the smoker has stopped smoking for at least 15 years. Yearly screening should be stopped for people who develop a health problem that would prevent them from having lung cancer treatment.  If you choose to drink alcohol, do not have more than 2 drinks per day. One drink is considered  to be 12 oz (360 mL) of beer, 5 oz (150 mL) of wine, or 1.5 oz (45 mL) of liquor.  Avoid the use of street drugs. Do not share needles with anyone. Ask for help if you need support or instructions about stopping the use of drugs.  High blood pressure causes heart disease and increases the risk of stroke. High blood pressure is more likely to develop in:  People who have blood pressure in the end of the normal range (100-139/85-89 mm Hg).  People who are overweight or  obese.  People who are African American.  If you are 26-9 years of age, have your blood pressure checked every 3-5 years. If you are 39 years of age or older, have your blood pressure checked every year. You should have your blood pressure measured twice--once when you are at a hospital or clinic, and once when you are not at a hospital or clinic. Record the average of the two measurements. To check your blood pressure when you are not at a hospital or clinic, you can use:  An automated blood pressure machine at a pharmacy.  A home blood pressure monitor.  If you are 19-73 years old, ask your health care provider if you should take aspirin to prevent heart disease.  Diabetes screening involves taking a blood sample to check your fasting blood sugar level. This should be done once every 3 years after age 38 if you are at a normal weight and without risk factors for diabetes. Testing should be considered at a younger age or be carried out more frequently if you are overweight and have at least 1 risk factor for diabetes.  Colorectal cancer can be detected and often prevented. Most routine colorectal cancer screening begins at the age of 11 and continues through age 69. However, your health care provider may recommend screening at an earlier age if you have risk factors for colon cancer. On a yearly basis, your health care provider may provide home test kits to check for hidden blood in the stool. A small camera at the end of a tube may be used to directly examine the colon (sigmoidoscopy or colonoscopy) to detect the earliest forms of colorectal cancer. Talk to your health care provider about this at age 14 when routine screening begins. A direct exam of the colon should be repeated every 5-10 years through age 49, unless early forms of precancerous polyps or small growths are found.  People who are at an increased risk for hepatitis B should be screened for this virus. You are considered at high risk  for hepatitis B if:  You were born in a country where hepatitis B occurs often. Talk with your health care provider about which countries are considered high risk.  Your parents were born in a high-risk country and you have not received a shot to protect against hepatitis B (hepatitis B vaccine).  You have HIV or AIDS.  You use needles to inject street drugs.  You live with, or have sex with, someone who has hepatitis B.  You are a man who has sex with other men (MSM).  You get hemodialysis treatment.  You take certain medicines for conditions like cancer, organ transplantation, and autoimmune conditions.  Hepatitis C blood testing is recommended for all people born from [birth year] through 1966 and any individual with known risk factors for hepatitis C.  Healthy men should no longer receive prostate-specific antigen (PSA) blood tests as part of routine cancer screening. Talk  to your health care provider about prostate cancer screening.  Testicular cancer screening is not recommended for adolescents or adult males who have no symptoms. Screening includes self-exam, a health care provider exam, and other screening tests. Consult with your health care provider about any symptoms you have or any concerns you have about testicular cancer.  Practice safe sex. Use condoms and avoid high-risk sexual practices to reduce the spread of sexually transmitted infections (STIs).  You should be screened for STIs, including gonorrhea and chlamydia if:  You are sexually active and are younger than 24 years.  You are older than 24 years, and your health care provider tells you that you are at risk for this type of infection.  Your sexual activity has changed since you were last screened, and you are at an increased risk for chlamydia or gonorrhea. Ask your health care provider if you are at risk.  If you are at risk of being infected with HIV, it is recommended that you take a prescription medicine daily to  prevent HIV infection. This is called pre-exposure prophylaxis (PrEP). You are considered at risk if:  You are a man who has sex with other men (MSM).  You are a heterosexual man who is sexually active with multiple partners.  You take drugs by injection.  You are sexually active with a partner who has HIV.  Talk with your health care provider about whether you are at high risk of being infected with HIV. If you choose to begin PrEP, you should first be tested for HIV. You should then be tested every 3 months for as long as you are taking PrEP.  Use sunscreen. Apply sunscreen liberally and repeatedly throughout the day. You should seek shade when your shadow is shorter than you. Protect yourself by wearing long sleeves, pants, a wide-brimmed hat, and sunglasses year round whenever you are outdoors.  Tell your health care provider of new moles or changes in moles, especially if there is a change in shape or color. Also, tell your health care provider if a mole is larger than the size of a pencil eraser.  A one-time screening for abdominal aortic aneurysm (AAA) and surgical repair of large AAAs by ultrasound is recommended for men aged 89-75 years who are current or former smokers.  Stay current with your vaccines (immunizations).   This information is not intended to replace advice given to you by your health care provider. Make sure you discuss any questions you have with your health care provider.   Document Released: 07/13/2007 Document Revised: 02/04/2014 Document Reviewed: 06/11/2010 Elsevier Interactive Patient Education [employer].

## 2015-07-27 NOTE — Assessment & Plan Note (Signed)

## 2015-07-27 NOTE — Assessment & Plan Note (Addendum)
Continue aspirin, statin. Not on beta blocker.  Goal LDL <70. Needs tight blood pressure control  Check A1c next visit. Mild residual R>L hand paresthesia and L vision loss (chronic R vision loss)

## 2015-07-27 NOTE — Progress Notes (Signed)
Pre visit review using our clinic review tool, if applicable. No additional management support is needed unless otherwise documented below in the visit note. 

## 2015-07-27 NOTE — Assessment & Plan Note (Signed)
Previously situational dysthymia after CVA, now seems improved.

## 2015-07-27 NOTE — Assessment & Plan Note (Signed)
Discussed healthy diet for better glycemic control.

## 2015-07-27 NOTE — Assessment & Plan Note (Signed)
Chronic, stable. BP low today - without endorsed sxs. Advised start monitoring at home and if consistently <110/70, to decrease quinapril to 20mg  daily. Pt agrees.

## 2015-07-27 NOTE — Assessment & Plan Note (Signed)
Reviewed low b12 level. Pt agrees to b12 shot today but declines monthly, agrees to regularly take 1075mcg OTC. Recheck levels in 6 months.

## 2015-07-27 NOTE — Assessment & Plan Note (Signed)
Advanced planning - has at home. HCPOA is wife. Does not want prolonged life support. If doesn't have capacity to make decisions would not want artificial prolongation of life. Wants to donate body to [place].  

## 2015-07-27 NOTE — Assessment & Plan Note (Signed)
Chronic R L worsened after stroke 2014

## 2015-07-27 NOTE — Assessment & Plan Note (Signed)
Carotid US 2014 - bilateral mild stenosis. Consider rpt [place] next year.

## 2015-07-27 NOTE — Assessment & Plan Note (Addendum)
Chronic, stable. Great control on lipitor, LDL and HDL low. Will decrease lipitor to 20mg  daily. Recheck FLP at 51mo f/u visit

## 2015-07-27 NOTE — Progress Notes (Signed)
BP 100/78 mmHg  Pulse 66  Temp(Src) 97.5 F (36.4 C)  Ht 6' 0.5" (1.842 m)  Wt 228 lb 1.9 oz (103.475 kg)  BMI 30.50 kg/m2  SpO2 95%   CC: medicare wellness visit  Subjective:    Patient ID: Evan Morales, male    DOB: May 20, 1945, 70 y.o.   MRN: [id number]  HPI: Evan Morales is a 70 y.o. 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 Saw neurologist. Doesn't check bp at home. Denies dizziness or lightheadedness.   More shoulder pain recently.  B12 def - not taking b12.   Hearing screen passed on right, failed left. Recent eye exam Dr Edilia Bo in [place]. Traumatic R vision loss as child, stroke affected residual L vision but stable - does not drive. No falls in last year. Denies depression/anhedonia, sadness.   Preventative: Colonoscopy 04/2005 - adenomatous polyp. Rec rpt 5 yrs.  Prostate - always normal. Improved nocturia - doesn't drink fluids after 6pm. Desires to stop screening.  Flu yearly.  Pneumovax 2013, prevnar today Td 2011.  zostavax 06/2012.  Advanced planning - has at home. HCPOA is wife. Does not want prolonged life support. If doesn't have capacity to make decisions would not want artificial prolongation of life. Wants to donate body to [facility] belt use discussed.  Sunscreen use discussed. No changing moles on skin.   Caffeine: 2-3 cups coffee/day Lives with wife. (grown children)  Occupation: retired, was Occupational hygienist  Activity: fishing at [employer], walking in [place], [employer] Diet: good water, fruits/vegetables daily  Relevant past medical, surgical, family and social history reviewed and updated as indicated. Interim medical history since our last visit reviewed. Allergies and medications reviewed and updated. Current Outpatient Prescriptions on File Prior to Visit  Medication Sig  . aspirin 325 MG tablet Take 1 tablet (325 mg total) by mouth daily.  .  hydrochlorothiazide (MICROZIDE) 12.5 MG capsule TAKE 1 CAPSULE BY MOUTH ONCE A DAY  . hydrocortisone-pramoxine (ANALPRAM-HC) 2.5-1 % rectal cream Place 1 application rectally 2 (two) times daily as needed for hemorrhoids.  . polyethylene glycol powder (GLYCOLAX/MIRALAX) powder Take 1/2 to a whole capful mixed in 8 oz fluid as needed for constipation, hold for diarrhea. (Patient taking differently: Take 0.5 Containers by mouth daily as needed for moderate constipation. )  . quinapril (ACCUPRIL) 40 MG tablet TAKE 1 TABLET BY MOUTH EVERY DAY   No current facility-administered medications on file prior to visit.    Review of Systems Per HPI unless specifically indicated in ROS section     Objective:    BP 100/78 mmHg  Pulse 66  Temp(Src) 97.5 F (36.4 C)  Ht 6' 0.5" (1.842 m)  Wt 228 lb 1.9 oz (103.475 kg)  BMI 30.50 kg/m2  SpO2 95%  Wt Readings from Last 3 Encounters:  07/27/15 228 lb 1.9 oz (103.475 kg)  04/18/15 234 lb 8 oz (106.369 kg)  07/26/14 237 lb (107.502 kg)    Physical Exam  Constitutional: He is oriented to person, place, and time. He appears well-developed and well-nourished. No distress.  HENT:  Head: Normocephalic and atraumatic.  Right Ear: Hearing, tympanic membrane, external ear and ear canal normal.  Left Ear: Hearing, tympanic membrane, external ear and ear canal normal.  Nose: Nose normal.  Mouth/Throat: Uvula is midline, oropharynx is clear and moist and mucous membranes are normal. No oropharyngeal exudate, posterior oropharyngeal edema or posterior oropharyngeal erythema.  Eyes: Conjunctivae and EOM are normal. Pupils are equal, round, and reactive to light. No scleral icterus.  Neck: Normal range of motion. Neck supple. Carotid bruit is not present. No thyromegaly present.  Cardiovascular: Normal rate, regular rhythm, normal heart sounds and intact distal pulses.   No murmur heard. Pulses:      Radial pulses are 2+ on the right side, and 2+ on the left  side.  Pulmonary/Chest: Effort normal and breath sounds normal. No respiratory distress. He has no wheezes. He has no rales.  Abdominal: Soft. Bowel sounds are normal. He exhibits no distension and no mass. There is no tenderness. There is no rebound and no guarding.  Musculoskeletal: Normal range of motion. He exhibits no edema.  Lymphadenopathy:    He has no cervical adenopathy.  Neurological: He is alert and oriented to person, place, and time.  CN grossly intact, station and gait intact Recall 3/3 Calculation 5/5 serial 3s  Skin: Skin is warm and dry. No rash noted.  Psychiatric: He has a normal mood and affect. His behavior is normal. Judgment and thought content normal.  Nursing note and vitals reviewed.  Results for orders placed or performed in visit on 07/24/15  Lipid panel  Result Value Ref Range   Cholesterol 88 0 - 200 mg/dL   Triglycerides 55.0 0.0 - 149.0 mg/dL   HDL 36.10 (L) >39.00 mg/dL   VLDL 11.0 0.0 - 40.0 mg/dL   LDL Cholesterol 41 0 - 99 mg/dL   Total CHOL/HDL Ratio 2    NonHDL 123XX123   Basic metabolic panel  Result Value Ref Range   Sodium 138 135 - 145 mEq/L   Potassium 4.1 3.5 - 5.1 mEq/L   Chloride 102 96 - 112 mEq/L   CO2 31 19 - 32 mEq/L   Glucose, Bld 103 (H) 70 - 99 mg/dL   BUN 14 6 - 23 mg/dL   Creatinine, Ser 0.92 0.40 - 1.50 mg/dL   Calcium 9.5 8.4 - 10.5 mg/dL   GFR 86.29 >60.00 mL/min  Vitamin B12  Result Value Ref Range   Vitamin B-12 202 (L) 211 - 911 pg/mL  Hepatitis C antibody  Result Value Ref Range   HCV Ab NEGATIVE NEGATIVE      Assessment & Plan:   Problem List Items Addressed This Visit    COLONIC POLYPS, ADENOMATOUS    Pt declines returning for colonoscopy at this time. Agrees to cologaurd      HYPERCHOLESTEROLEMIA    Chronic, stable. Great control on lipitor, LDL and HDL low. Will decrease lipitor to 20mg  daily. Recheck FLP at 57mo f/u visit      Relevant Medications   atorvastatin (LIPITOR) 20 MG tablet   Essential  hypertension    Chronic, stable. BP low today - without endorsed sxs. Advised start monitoring at home and if consistently <110/70, to decrease quinapril to 20mg  daily. Pt agrees.       Relevant Medications   atorvastatin (LIPITOR) 20 MG tablet   Hyperglycemia    Discussed healthy diet for better glycemic control.       CVA (cerebral vascular accident) ([place])    Continue aspirin, statin. Not on beta blocker.  Goal LDL <70. Needs tight blood pressure control  Check A1c next visit. Mild residual R>L hand paresthesia and L vision loss (chronic R vision loss)      Relevant Medications   atorvastatin (LIPITOR) 20 MG tablet   Medicare annual wellness visit, subsequent - Primary    I  have personally reviewed the Medicare Annual Wellness questionnaire and have noted 1. The patient's medical and social history 2. Their use of alcohol, tobacco or illicit drugs 3. Their current medications and supplements 4. The patient's functional ability including ADL's, fall risks, home safety risks and hearing or visual impairment. Cognitive function has been assessed and addressed as indicated.  5. Diet and physical activity 6. Evidence for depression or mood disorders The patients weight, height, BMI have been recorded in the chart. I have made referrals, counseling and provided education to the patient based on review of the above and I have provided the pt with a written personalized care plan for preventive services. Provider list updated.. See scanned questionairre as needed for further documentation. Reviewed preventative protocols and updated unless pt declined.       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 Consider rpt [place] next year.       Relevant Medications   atorvastatin (LIPITOR) 20 MG tablet   Advanced care planning/counseling discussion    Advanced planning - has at  home. HCPOA is wife. Does not want prolonged life support. If doesn't have capacity to make decisions would not want artificial prolongation of life. Wants [facility] body to [facility].       Dysthymia    Previously situational dysthymia after CVA, now seems improved.      Vitamin B12 deficiency    Reviewed low b12 level. Pt agrees to b12 shot today but declines monthly, agrees to regularly take 1073mcg OTC. Recheck levels in 6 months.       Obesity, Class I, BMI 30-34.9       Follow up plan: Return in about 6 months (around 01/26/2016) for follow up visit.  Ria Bush, MD

## 2015-07-31 ENCOUNTER — Other Ambulatory Visit: Payer: Medicare Other

## 2015-08-02 ENCOUNTER — Encounter: Payer: Medicare Other | Admitting: Family Medicine

## 2015-08-05 LAB — COLOGUARD

## 2015-08-07 ENCOUNTER — Telehealth: Payer: Self-pay

## 2015-08-07 DIAGNOSIS — Z1211 Encounter for screening for malignant neoplasm of colon: Secondary | ICD-10-CM

## 2015-08-07 NOTE — Telephone Encounter (Signed)
Ok to do - may come in to pick up iFOB at his convenience ordered

## 2015-08-07 NOTE — Telephone Encounter (Signed)
Patient notified as instructed by telephone and verbalized understanding. 

## 2015-08-07 NOTE — Telephone Encounter (Signed)
Pt left v/m; pt received the cologuard kit; pt has problem seeing and was unable to fill out the cologuard info but sent the cologuard back by [employer]; pt wants to know if can get the 3 card ifob? Like has gotten in the past. Pt request cb.

## 2015-08-11 ENCOUNTER — Telehealth: Payer: Self-pay | Admitting: Family Medicine

## 2015-08-11 NOTE — Telephone Encounter (Signed)
[place] from [facility] said pt had been in pharmacy x 2 to pick up quinapril refill; I spoke with pt; pt was seen for annual 07/27/15 and per instructions pt was to monitor BP at home and if BP consistently below 110/70 pt was to take quinapril 40 mg 1/2 tab daily. Pt has not taken BP since seen on 07/27/15 but pt understood at annual visit to decrease quinapril 40 mg taking 1/2 tab daily. I refilled rx per med list which was quinapril 40 mg taking one daily. Pt is aware and will wait for cb to see if should take quinapril 40 mg 1/2 tab or 1 tab daily. Please advise.

## 2015-08-11 NOTE — Telephone Encounter (Signed)
Depends on how blood pressures are running. May continue 1/2 tablet quinapril 40mg  daily (20mg  dose) as long as bp well controlled. Recommend start monitoring bp a few times a week, update me with readings on lower dose.

## 2015-08-15 NOTE — Telephone Encounter (Signed)
Patient and his wife notified and will call with BP update.

## 2015-08-15 NOTE — Telephone Encounter (Addendum)
Mrs Hnat left v/m requesting cb to verify what dose of Quinapril pt is to take.

## 2015-08-15 NOTE — Telephone Encounter (Signed)
Spoke with patient's wife. She was confused about cutting the statin or the quinapril. Advised to take a whole statin and a half of the quinapril. She verbalized understanding.

## 2015-08-16 ENCOUNTER — Other Ambulatory Visit: Payer: PPO

## 2015-08-16 DIAGNOSIS — Z1211 Encounter for screening for malignant neoplasm of colon: Secondary | ICD-10-CM

## 2015-08-16 LAB — FECAL OCCULT BLOOD, IMMUNOCHEMICAL: Fecal Occult Bld: NEGATIVE

## 2015-08-16 LAB — FECAL OCCULT BLOOD, GUAIAC: Fecal Occult Blood: NEGATIVE

## 2015-08-17 ENCOUNTER — Encounter: Payer: Self-pay | Admitting: *Deleted

## 2015-08-21 ENCOUNTER — Telehealth: Payer: Self-pay | Admitting: Family Medicine

## 2015-08-21 NOTE — Telephone Encounter (Signed)
Pt left msg on Triage phone requesting a prescription for a Blood Pressure Cuff.  Best number to call when complete is [phone]

## 2015-08-22 NOTE — Telephone Encounter (Signed)
Notified and Rx mailed to patient as requested.

## 2015-08-22 NOTE — Telephone Encounter (Signed)
Rx written and in Kim's box. 

## 2015-08-28 ENCOUNTER — Encounter: Payer: Self-pay | Admitting: Family Medicine

## 2015-11-11 ENCOUNTER — Other Ambulatory Visit: Payer: Self-pay | Admitting: Family Medicine

## 2016-01-30 ENCOUNTER — Encounter: Payer: Self-pay | Admitting: Family Medicine

## 2016-01-30 ENCOUNTER — Ambulatory Visit (INDEPENDENT_AMBULATORY_CARE_PROVIDER_SITE_OTHER): Payer: PPO | Admitting: Family Medicine

## 2016-01-30 ENCOUNTER — Ambulatory Visit: Payer: PPO | Admitting: Family Medicine

## 2016-01-30 VITALS — BP 100/68 | HR 76 | Temp 97.5°F | Wt 232.5 lb

## 2016-01-30 DIAGNOSIS — G8929 Other chronic pain: Secondary | ICD-10-CM | POA: Insufficient documentation

## 2016-01-30 DIAGNOSIS — M25512 Pain in left shoulder: Secondary | ICD-10-CM

## 2016-01-30 DIAGNOSIS — M25511 Pain in right shoulder: Secondary | ICD-10-CM | POA: Diagnosis not present

## 2016-01-30 DIAGNOSIS — E538 Deficiency of other specified B group vitamins: Secondary | ICD-10-CM

## 2016-01-30 DIAGNOSIS — R739 Hyperglycemia, unspecified: Secondary | ICD-10-CM

## 2016-01-30 DIAGNOSIS — I10 Essential (primary) hypertension: Secondary | ICD-10-CM

## 2016-01-30 DIAGNOSIS — Z23 Encounter for immunization: Secondary | ICD-10-CM | POA: Diagnosis not present

## 2016-01-30 DIAGNOSIS — I6930 Unspecified sequelae of cerebral infarction: Secondary | ICD-10-CM

## 2016-01-30 DIAGNOSIS — E7800 Pure hypercholesterolemia, unspecified: Secondary | ICD-10-CM | POA: Diagnosis not present

## 2016-01-30 LAB — LIPID PANEL
Cholesterol: 102 mg/dL (ref 0–200)
HDL: 37.7 mg/dL — AB (ref >39.00)
LDL Cholesterol: 50 mg/dL (ref 0–99)
NONHDL: 64.11
Total CHOL/HDL Ratio: 3
Triglycerides: 71 mg/dL (ref 0.0–149.0)
VLDL: 14.2 mg/dL (ref 0.0–40.0)

## 2016-01-30 LAB — VITAMIN B12: VITAMIN B 12: 191 pg/mL — AB (ref 211–911)

## 2016-01-30 LAB — CK: CK TOTAL: 39 U/L (ref 7–232)

## 2016-01-30 LAB — SEDIMENTATION RATE: SED RATE: 4 mm/h (ref 0–20)

## 2016-01-30 LAB — HEMOGLOBIN A1C: HEMOGLOBIN A1C: 5.9 % (ref 4.6–6.5)

## 2016-01-30 MED ORDER — ATORVASTATIN CALCIUM 20 MG PO TABS: 20.0000 mg | ORAL_TABLET | Freq: Every day | ORAL | 3 refills | Status: DC

## 2016-01-30 MED ORDER — CYANOCOBALAMIN 1000 MCG/ML IJ SOLN
1000.0000 ug | Freq: Once | INTRAMUSCULAR | Status: AC
  Administered 2016-01-30: 1000 ug via INTRAMUSCULAR

## 2016-01-30 MED ORDER — HYDROCHLOROTHIAZIDE 12.5 MG PO CAPS: 12.5000 mg | ORAL_CAPSULE | Freq: Every day | ORAL | 3 refills | Status: DC

## 2016-01-30 MED ORDER — QUINAPRIL HCL 20 MG PO TABS: 20.0000 mg | ORAL_TABLET | Freq: Every day | ORAL | 3 refills | Status: DC

## 2016-01-30 NOTE — Assessment & Plan Note (Addendum)
Chronic, stable. Update FLP. We recently decreased lipitor dose to 20mg  daily.

## 2016-01-30 NOTE — Assessment & Plan Note (Signed)
Check A1c today.

## 2016-01-30 NOTE — Progress Notes (Signed)
Pre visit review using our clinic review tool, if applicable. No additional management support is needed unless otherwise documented below in the visit note. 

## 2016-01-30 NOTE — Assessment & Plan Note (Addendum)
Chronic, stable. BP remains mildly low but pt denies hypotensive symptoms. Continue lower quinapril 20mg  dose + HCTZ 12.5mg .

## 2016-01-30 NOTE — Assessment & Plan Note (Signed)
Update b12 level then b12 shot today. Not regular with oral replacement.

## 2016-01-30 NOTE — Assessment & Plan Note (Signed)
Continue aspirin, statin. Goal LDL <70.  Residual L vision loss (chronic R vision loss) and mild memory trouble.

## 2016-01-30 NOTE — Progress Notes (Signed)
BP 100/68   Pulse 76   Temp 97.5 F (36.4 C) (Oral)   Wt 232 lb 8 oz (105.5 kg)   BMI 31.10 kg/m    CC: 6 mo f/u visit Subjective:    Patient ID: Evan Morales, male    DOB: 16-Aug-1945, 71 y.o.   MRN: [id number]  HPI: Evan Morales is a 71 y.o. 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 Saw neurologist. Bought BP cuff at home - running . Denies dizziness or lightheadedness. Pt has been taking aspirin 70m daily, not 323m Med rec performed. Ongoing trouble with memory - grandson's names - since stroke. Still does well with Jeopardy questions.  Ongoing bilateral shoulder pain as well as shoulder blade pain for last 6+ months. Salon pas helpful. Hadn't tried heating pad. No stiffness, fevers. No other joint pains.   Insomnia - taking advil PM. Lots of TV time, daytime naps. Good light during the day.   Relevant past medical, surgical, family and social history reviewed and updated as indicated. Interim medical history since our last visit reviewed. Allergies and medications reviewed and updated. Current Outpatient Prescriptions on File Prior to Visit  Medication Sig  . polyethylene glycol powder (GLYCOLAX/MIRALAX) powder Take 1/2 to a whole capful mixed in 8 oz fluid as needed for constipation, hold for diarrhea. (Patient taking differently: Take 0.5 Containers by mouth daily as needed for moderate constipation. )  . vitamin B-12 (CYANOCOBALAMIN) 1000 MCG tablet Take 1 tablet (1,000 mcg total) by mouth daily.   No current facility-administered medications on file prior to visit.     Review of Systems Per HPI unless specifically indicated in ROS section     Objective:    BP 100/68   Pulse 76   Temp 97.5 F (36.4 C) (Oral)   Wt 232 lb 8 oz (105.5 kg)   BMI 31.10 kg/m   Wt Readings from Last 3 Encounters:  01/30/16 232 lb 8 oz (105.5 kg)  07/27/15 228 lb 1.9 oz (103.5 kg)  04/18/15 234 lb  8 oz (106.4 kg)    Physical Exam  Constitutional: He appears well-developed and well-nourished. No distress.  HENT:  Mouth/Throat: Oropharynx is clear and moist. No oropharyngeal exudate.  Neck: Normal range of motion. Neck supple.  Cardiovascular: Normal rate, regular rhythm, normal heart sounds and intact distal pulses.   No murmur heard. Pulmonary/Chest: Effort normal. No respiratory distress. He has no wheezes. He has no rales.  Musculoskeletal: He exhibits no edema.  Marked limited ROM in abduction and forward flexion bilateral shoulders Mildly tender to palpation at bilateral shoulders  Skin: Skin is warm and dry. No rash noted.  Psychiatric: He has a normal mood and affect.  Nursing note and vitals reviewed.  Results for orders placed or performed in visit on 08/28/15  Cologuard  Result Value Ref Range   Cologuard        Assessment & Plan:   Problem List Items Addressed This Visit    Chronic pain of both shoulders - Primary    Ongoing for 6+ months. ?arthritis Check CPK (?statin related) and ESR (r/o PMR).  Discussed advil PM use in relation to aspirin.      Relevant Orders   CK   Sedimentation rate   Essential hypertension    Chronic, stable. BP remains mildly low but pt denies hypotensive symptoms. Continue lower quinapril 2054mose + HCTZ 12.5mg93m  Relevant Medications   aspirin EC 81 MG tablet   atorvastatin (LIPITOR) 20 MG tablet   hydrochlorothiazide (MICROZIDE) 12.5 MG capsule   quinapril (ACCUPRIL) 20 MG tablet   History of cerebrovascular accident (CVA) with residual deficit    Continue aspirin, statin. Goal LDL <70.  Residual L vision loss (chronic R vision loss) and mild memory trouble.       HYPERCHOLESTEROLEMIA    Chronic, stable. Update FLP. We recently decreased lipitor dose to 97m daily.       Relevant Medications   aspirin EC 81 MG tablet   atorvastatin (LIPITOR) 20 MG tablet   hydrochlorothiazide (MICROZIDE) 12.5 MG capsule    quinapril (ACCUPRIL) 20 MG tablet   Other Relevant Orders   Lipid panel   Hyperglycemia    Check A1c today.       Relevant Orders   Hemoglobin A1c   Vitamin B12 deficiency    Update b12 level then b12 shot today. Not regular with oral replacement.       Relevant Orders   Vitamin B12       Follow up plan: Return in about 6 months (around 07/29/2016) for medicare wellness visit.  JRia Bush MD

## 2016-01-30 NOTE — Addendum Note (Signed)
Addended by: Emelia Salisbury C on: 01/30/2016 10:49 AM   Modules accepted: Orders

## 2016-01-30 NOTE — Patient Instructions (Addendum)
Flu shot today Labs today then B12 shot.  Trial co enzyme q10 for joint pains in setting of cholesterol medicine.  Return as needed or in 6 months for medicare wellness visit

## 2016-01-30 NOTE — Assessment & Plan Note (Addendum)
Ongoing for 6+ months. ?arthritis Check CPK (?statin related) and ESR (r/o PMR).  Discussed advil PM use in relation to aspirin.

## 2016-01-31 ENCOUNTER — Encounter: Payer: Self-pay | Admitting: *Deleted

## 2016-02-21 DIAGNOSIS — H40012 Open angle with borderline findings, low risk, left eye: Secondary | ICD-10-CM | POA: Diagnosis not present

## 2016-02-21 DIAGNOSIS — H4031X4 Glaucoma secondary to eye trauma, right eye, indeterminate stage: Secondary | ICD-10-CM | POA: Diagnosis not present

## 2016-07-26 DIAGNOSIS — H40012 Open angle with borderline findings, low risk, left eye: Secondary | ICD-10-CM | POA: Diagnosis not present

## 2016-07-26 DIAGNOSIS — H4031X3 Glaucoma secondary to eye trauma, right eye, severe stage: Secondary | ICD-10-CM | POA: Insufficient documentation

## 2016-07-28 ENCOUNTER — Other Ambulatory Visit: Payer: Self-pay | Admitting: Family Medicine

## 2016-07-28 DIAGNOSIS — E538 Deficiency of other specified B group vitamins: Secondary | ICD-10-CM

## 2016-07-28 DIAGNOSIS — E7800 Pure hypercholesterolemia, unspecified: Secondary | ICD-10-CM

## 2016-07-28 DIAGNOSIS — R739 Hyperglycemia, unspecified: Secondary | ICD-10-CM

## 2016-07-29 ENCOUNTER — Ambulatory Visit: Payer: PPO

## 2016-07-29 ENCOUNTER — Other Ambulatory Visit (INDEPENDENT_AMBULATORY_CARE_PROVIDER_SITE_OTHER): Payer: PPO

## 2016-07-29 DIAGNOSIS — E7800 Pure hypercholesterolemia, unspecified: Secondary | ICD-10-CM

## 2016-07-29 DIAGNOSIS — E538 Deficiency of other specified B group vitamins: Secondary | ICD-10-CM

## 2016-07-29 DIAGNOSIS — R739 Hyperglycemia, unspecified: Secondary | ICD-10-CM

## 2016-07-29 LAB — COMPREHENSIVE METABOLIC PANEL
ALT: 15 U/L (ref 0–53)
AST: 14 U/L (ref 0–37)
Albumin: 4.4 g/dL (ref 3.5–5.2)
Alkaline Phosphatase: 73 U/L (ref 39–117)
BILIRUBIN TOTAL: 1.7 mg/dL — AB (ref 0.2–1.2)
BUN: 12 mg/dL (ref 6–23)
CHLORIDE: 102 meq/L (ref 96–112)
CO2: 28 meq/L (ref 19–32)
CREATININE: 0.89 mg/dL (ref 0.40–1.50)
Calcium: 9.6 mg/dL (ref 8.4–10.5)
GFR: 89.39 mL/min (ref >60.00)
GLUCOSE: 113 mg/dL — AB (ref 70–99)
Potassium: 4 mEq/L (ref 3.5–5.1)
Sodium: 137 mEq/L (ref 135–145)
Total Protein: 7.2 g/dL (ref 6.0–8.3)

## 2016-07-29 LAB — LIPID PANEL
CHOL/HDL RATIO: 2
Cholesterol: 87 mg/dL (ref 0–200)
HDL: 37.1 mg/dL — ABNORMAL LOW (ref >39.00)
LDL Cholesterol: 36 mg/dL (ref 0–99)
NONHDL: 49.52
Triglycerides: 68 mg/dL (ref 0.0–149.0)
VLDL: 13.6 mg/dL (ref 0.0–40.0)

## 2016-07-29 LAB — VITAMIN B12: Vitamin B-12: 193 pg/mL — ABNORMAL LOW (ref 211–911)

## 2016-07-29 LAB — HEMOGLOBIN A1C: Hgb A1c MFr Bld: 6 % (ref 4.6–6.5)

## 2016-08-05 ENCOUNTER — Encounter: Payer: Self-pay | Admitting: Family Medicine

## 2016-08-05 ENCOUNTER — Ambulatory Visit (INDEPENDENT_AMBULATORY_CARE_PROVIDER_SITE_OTHER): Payer: PPO | Admitting: Family Medicine

## 2016-08-05 VITALS — BP 120/78 | HR 56 | Temp 98.2°F | Ht 72.5 in | Wt 227.0 lb

## 2016-08-05 DIAGNOSIS — E538 Deficiency of other specified B group vitamins: Secondary | ICD-10-CM

## 2016-08-05 DIAGNOSIS — I6930 Unspecified sequelae of cerebral infarction: Secondary | ICD-10-CM

## 2016-08-05 DIAGNOSIS — E7800 Pure hypercholesterolemia, unspecified: Secondary | ICD-10-CM

## 2016-08-05 DIAGNOSIS — R7303 Prediabetes: Secondary | ICD-10-CM

## 2016-08-05 DIAGNOSIS — Z0000 Encounter for general adult medical examination without abnormal findings: Secondary | ICD-10-CM

## 2016-08-05 DIAGNOSIS — E669 Obesity, unspecified: Secondary | ICD-10-CM

## 2016-08-05 DIAGNOSIS — F341 Dysthymic disorder: Secondary | ICD-10-CM

## 2016-08-05 DIAGNOSIS — H543 Unqualified visual loss, both eyes: Secondary | ICD-10-CM

## 2016-08-05 DIAGNOSIS — I10 Essential (primary) hypertension: Secondary | ICD-10-CM

## 2016-08-05 DIAGNOSIS — Z7189 Other specified counseling: Secondary | ICD-10-CM

## 2016-08-05 MED ORDER — HYDROCHLOROTHIAZIDE 12.5 MG PO CAPS: 12.5000 mg | ORAL_CAPSULE | Freq: Every day | ORAL | 3 refills | Status: DC

## 2016-08-05 MED ORDER — CYANOCOBALAMIN 1000 MCG/ML IJ SOLN
1000.0000 ug | Freq: Once | INTRAMUSCULAR | Status: AC
  Administered 2016-08-05: 1000 ug via INTRAMUSCULAR

## 2016-08-05 MED ORDER — ATORVASTATIN CALCIUM 20 MG PO TABS: 20.0000 mg | ORAL_TABLET | Freq: Every day | ORAL | 3 refills | Status: DC

## 2016-08-05 MED ORDER — QUINAPRIL HCL 20 MG PO TABS: 20.0000 mg | ORAL_TABLET | Freq: Every day | ORAL | 3 refills | Status: DC

## 2016-08-05 NOTE — Progress Notes (Signed)
BP 120/78 (BP Location: Left Arm, Patient Position: Sitting, Cuff Size: Large)   Pulse (!) 56   Temp 98.2 F (36.8 C) (Oral)   Ht 6' 0.5" (1.842 m)   Wt 227 lb (103 kg)   SpO2 96%   BMI 30.36 kg/m    CC: AMW/CPE Subjective:    Patient ID: Evan Morales, male    DOB: [birth year], 71 y.o.   MRN: [id number]  HPI: Evan Morales is a 71 y.o. male presenting on 08/05/2016 for Medicare Wellness   No falls in last year. One near fall Denies depression/anhedonia, sadness.   Hearing Screening   Method: Audiometry   125Hz  250Hz  500Hz  1000Hz  2000Hz  3000Hz  4000Hz  6000Hz  8000Hz   Right ear:   25 25 25   40    Left ear:   25 40 25  40    Vision Screening Comments: February 2018 sees Dr Edilia Bo regularly  Preventative: Colonoscopy 04/2005 with adenomatous polyp. Rec rpt 5 yrs. Rpt colonoscopy deferred after CVA, has decided to proceed with iFOBs in interim (1 positive 03/2012, one negative 2017). Discussed options, would like iFOB again this year.  Prostate - always normal. Improved nocturia - doesn't drink fluids after 6pm. Desires to stop screening.  Flu yearly. Pneumovax 2013, prevnar 2017 Td 2011.  zostavax 06/2012.  shingrix - discussed, declines Advanced planning - packet provided today. HCPOA is wife or son. Does not want prolonged life support. If doesn't have capacity to make decisions would not want artificial prolongation of life. Wants to donate body to [place] use discussed.  Sunscreen use discussed. No changing moles on skin.  Non smoker Alcohol - none  Caffeine: 2-3 cups coffee/day  Lives with wife. (grown children)  Occupation: retired, was Occupational hygienist  Activity: walking daily at least 1/2 mi  Diet: good water, fruits/vegetables daily   Relevant past medical, surgical, family and social history reviewed and updated as indicated. Interim medical history since our last visit reviewed. Allergies and medications reviewed and  updated. Outpatient Medications Prior to Visit  Medication Sig Dispense Refill  . aspirin EC 81 MG tablet Take 81 mg by mouth daily.    . polyethylene glycol powder (GLYCOLAX/MIRALAX) powder Take 1/2 to a whole capful mixed in 8 oz fluid as needed for constipation, hold for diarrhea. (Patient taking differently: Take 0.5 Containers by mouth daily as needed for moderate constipation. ) 3350 g 1  . vitamin B-12 (CYANOCOBALAMIN) 1000 MCG tablet Take 1 tablet (1,000 mcg total) by mouth daily.    Marland Kitchen atorvastatin (LIPITOR) 20 MG tablet Take 1 tablet (20 mg total) by mouth daily at 6 PM. 90 tablet 3  . hydrochlorothiazide (MICROZIDE) 12.5 MG capsule Take 1 capsule (12.5 mg total) by mouth daily. 90 capsule 3  . quinapril (ACCUPRIL) 20 MG tablet Take 1 tablet (20 mg total) by mouth daily. 90 tablet 3   No facility-administered medications prior to visit.      Per HPI unless specifically indicated in ROS section below Review of Systems  Constitutional: Negative for activity change, appetite change, chills, fatigue, fever and unexpected weight change.  HENT: Negative for hearing loss.   Eyes: Negative for visual disturbance.  Respiratory: Positive for cough (mild dry cough occasionally). Negative for chest tightness, shortness of breath and wheezing.   Cardiovascular: Negative for chest pain, palpitations and leg swelling.  Gastrointestinal: Negative for abdominal distention, abdominal pain, blood in stool, constipation, diarrhea, nausea and vomiting.  Genitourinary: Negative for difficulty urinating and  hematuria.  Musculoskeletal: Negative for arthralgias, myalgias and neck pain.  Skin: Negative for rash.  Neurological: Negative for dizziness, seizures, syncope and headaches.  Hematological: Negative for adenopathy. Does not bruise/bleed easily.  Psychiatric/Behavioral: Negative for dysphoric mood. The patient is not nervous/anxious.        Objective:    BP 120/78 (BP Location: Left Arm,  Patient Position: Sitting, Cuff Size: Large)   Pulse (!) 56   Temp 98.2 F (36.8 C) (Oral)   Ht 6' 0.5" (1.842 m)   Wt 227 lb (103 kg)   SpO2 96%   BMI 30.36 kg/m   Wt Readings from Last 3 Encounters:  08/05/16 227 lb (103 kg)  01/30/16 232 lb 8 oz (105.5 kg)  07/27/15 228 lb 1.9 oz (103.5 kg)    Physical Exam  Constitutional: He is oriented to person, place, and time. He appears well-developed and well-nourished. No distress.  HENT:  Head: Normocephalic and atraumatic.  Right Ear: Hearing, tympanic membrane, external ear and ear canal normal.  Left Ear: Hearing, tympanic membrane, external ear and ear canal normal.  Nose: Nose normal.  Mouth/Throat: Uvula is midline, oropharynx is clear and moist and mucous membranes are normal. No oropharyngeal exudate, posterior oropharyngeal edema or posterior oropharyngeal erythema.  Eyes: Conjunctivae and EOM are normal. Pupils are equal, round, and reactive to light. No scleral icterus.  Neck: Normal range of motion. Neck supple. Carotid bruit is not present. No thyromegaly present.  Cardiovascular: Normal rate, regular rhythm, normal heart sounds and intact distal pulses.   No murmur heard. Pulses:      Radial pulses are 2+ on the right side, and 2+ on the left side.  Pulmonary/Chest: Effort normal and breath sounds normal. No respiratory distress. He has no wheezes. He has no rales.  Abdominal: Soft. Bowel sounds are normal. He exhibits no distension and no mass. There is no tenderness. There is no rebound and no guarding.  Genitourinary: Rectum normal and prostate normal. Rectal exam shows no external hemorrhoid, no internal hemorrhoid, no fissure, no mass, no tenderness and anal tone normal. Prostate is not enlarged and not tender.  Musculoskeletal: Normal range of motion. He exhibits no edema.  Lymphadenopathy:    He has no cervical adenopathy.  Neurological: He is alert and oriented to person, place, and time.  CN grossly intact,  station and gait intact Recall 3/3 Calculation 5/5 serial 3s  Skin: Skin is warm and dry. No rash noted.  Psychiatric: He has a normal mood and affect. His behavior is normal. Judgment and thought content normal.  Nursing note and vitals reviewed.  Results for orders placed or performed in visit on 07/29/16  Lipid panel  Result Value Ref Range   Cholesterol 87 0 - 200 mg/dL   Triglycerides 68.0 0.0 - 149.0 mg/dL   HDL 37.10 (L) >39.00 mg/dL   VLDL 13.6 0.0 - 40.0 mg/dL   LDL Cholesterol 36 0 - 99 mg/dL   Total CHOL/HDL Ratio 2    NonHDL 49.52   Comprehensive metabolic panel  Result Value Ref Range   Sodium 137 135 - 145 mEq/L   Potassium 4.0 3.5 - 5.1 mEq/L   Chloride 102 96 - 112 mEq/L   CO2 28 19 - 32 mEq/L   Glucose, Bld 113 (H) 70 - 99 mg/dL   BUN 12 6 - 23 mg/dL   Creatinine, Ser 0.89 0.40 - 1.50 mg/dL   Total Bilirubin 1.7 (H) 0.2 - 1.2 mg/dL   Alkaline Phosphatase 73  39 - 117 U/L   AST 14 0 - 37 U/L   ALT 15 0 - 53 U/L   Total Protein 7.2 6.0 - 8.3 g/dL   Albumin 4.4 3.5 - 5.2 g/dL   Calcium 9.6 8.4 - 10.5 mg/dL   GFR 89.39 >60.00 mL/min  Hemoglobin A1c  Result Value Ref Range   Hgb A1c MFr Bld 6.0 4.6 - 6.5 %  Vitamin B12  Result Value Ref Range   Vitamin B-12 193 (L) 211 - 911 pg/mL      Assessment & Plan:   Problem List Items Addressed This Visit    Advanced care planning/counseling discussion    Advanced planning - packet provided today. HCPOA is wife or son. Does not want prolonged life support. If doesn't have capacity to make decisions would not want artificial prolongation of life. Wants to donate body to [place]    Denies significant trouble with this.       Essential hypertension    Chronic, stable. Continue current regimen.      Relevant Medications   atorvastatin (LIPITOR) 20 MG tablet   hydrochlorothiazide (MICROZIDE) 12.5 MG capsule   quinapril (ACCUPRIL) 20 MG tablet   Health maintenance examination     Preventative protocols reviewed and updated unless pt declined. Discussed healthy diet and lifestyle.       History of cerebrovascular accident (CVA) with residual deficit    Continue aspirin, statin, goal LDL <70.  Residual worsened L vision loss and mild memory trouble after stroke.       HYPERCHOLESTEROLEMIA    Chronic, stable. Continue current regimen.       Relevant Medications   atorvastatin (LIPITOR) 20 MG tablet   hydrochlorothiazide (MICROZIDE) 12.5 MG capsule   quinapril (ACCUPRIL) 20 MG tablet   Medicare annual wellness visit, subsequent - Primary    I have personally reviewed the Medicare Annual Wellness questionnaire and have noted 1. The patient's medical and social history 2. Their use of alcohol, tobacco or illicit drugs 3. Their current medications and supplements 4. The patient's functional ability including ADL's, fall risks, home safety risks and hearing or visual impairment. Cognitive function has been assessed and addressed as indicated.  5. Diet and physical activity 6. Evidence for depression or mood disorders The patients weight, height, BMI have been recorded in the chart. I have made referrals, counseling and provided education to the patient based on review of the above and I have provided the pt with a written personalized care plan for preventive services. Provider list updated.. See scanned questionairre as needed for further documentation. Reviewed preventative protocols and updated unless pt declined.       Obesity, Class I, BMI 30-34.9    Discussed healthy diet and lifestyle changes to affect sustainable weight loss.       Prediabetes    Discussed avoiding added sugars and simple carbs.      Vision loss, bilateral    Chronic R, worsened L since stroke 2014.       Vitamin B12 deficiency    Ongoing deficiency. Not regular with oral replacement. Declines monthly B12 shots but agrees to shot today. I encouraged more regularity with oral  replacement.           Follow up plan: Return in about 1 year (around 08/05/2017) for medicare wellness visit, annual exam, prior fasting for blood work.  Ria Bush, MD

## 2016-08-05 NOTE — Assessment & Plan Note (Signed)
Discussed avoiding added sugars and simple carbs.

## 2016-08-05 NOTE — Assessment & Plan Note (Signed)
Denies significant trouble with this.  

## 2016-08-05 NOTE — Assessment & Plan Note (Signed)
Preventative protocols reviewed and updated unless pt declined. Discussed healthy diet and lifestyle.  

## 2016-08-05 NOTE — Assessment & Plan Note (Signed)
Chronic, stable. Continue current regimen. 

## 2016-08-05 NOTE — Patient Instructions (Addendum)
Pass by lab to pick up stool kit.  Advanced directive packet provided today.  b12 remains low - shot today. Be regular with 1063mg b12 oral supplement daily.  Continue current medicines which were refilled. Return as needed or in 1 year for next wellness visit and physical.   Health Maintenance, Male A healthy lifestyle and preventive care is important for your health and wellness. Ask your health care provider about what schedule of regular examinations is right for you. What should I know about weight and diet? Eat a Healthy Diet  Eat plenty of vegetables, fruits, whole grains, low-fat dairy products, and lean protein.  Do not eat a lot of foods high in solid fats, added sugars, or salt.  Maintain a Healthy Weight Regular exercise can help you achieve or maintain a healthy weight. You should:  Do at least 150 minutes of exercise each week. The exercise should increase your heart rate and make you sweat (moderate-intensity exercise).  Do strength-training exercises at least twice a week.  Watch Your Levels of Cholesterol and Blood Lipids  Have your blood tested for lipids and cholesterol every 5 years starting at 72years of age. If you are at high risk for heart disease, you should start having your blood tested when you are 72years old. You may need to have your cholesterol levels checked more often if: ? Your lipid or cholesterol levels are high. ? You are older than 72years of age. ? You are at high risk for heart disease.  What should I know about cancer screening? Many types of cancers can be detected early and may often be prevented. Lung Cancer  You should be screened every year for lung cancer if: ? You are a current smoker who has smoked for at least 30 years. ? You are a former smoker who has quit within the past 15 years.  Talk to your health care provider about your screening options, when you should start screening, and how often you should be  screened.  Colorectal Cancer  Routine colorectal cancer screening usually begins at 72years of age and should be repeated every 5-10 years until you are 72years old. You may need to be screened more often if early forms of precancerous polyps or small growths are found. Your health care provider may recommend screening at an earlier age if you have risk factors for colon cancer.  Your health care provider may recommend using home test kits to check for hidden blood in the stool.  A small camera at the end of a tube can be used to examine your colon (sigmoidoscopy or colonoscopy). This checks for the earliest forms of colorectal cancer.  Prostate and Testicular Cancer  Depending on your age and overall health, your health care provider may do certain tests to screen for prostate and testicular cancer.  Talk to your health care provider about any symptoms or concerns you have about testicular or prostate cancer.  Skin Cancer  Check your skin from head to toe regularly.  Tell your health care provider about any new moles or changes in moles, especially if: ? There is a change in a mole's size, shape, or color. ? You have a mole that is larger than a pencil eraser.  Always use sunscreen. Apply sunscreen liberally and repeat throughout the day.  Protect yourself by wearing long sleeves, pants, a wide-brimmed hat, and sunglasses when outside.  What should I know about heart disease, diabetes, and high blood pressure?  If you are 74-45 years of age, have your blood pressure checked every 3-5 years. If you are 54 years of age or older, have your blood pressure checked every year. You should have your blood pressure measured twice-once when you are at a hospital or clinic, and once when you are not at a hospital or clinic. Record the average of the two measurements. To check your blood pressure when you are not at a hospital or clinic, you can use: ? An automated blood pressure machine at a  pharmacy. ? A home blood pressure monitor.  Talk to your health care provider about your target blood pressure.  If you are between 21-45 years old, ask your health care provider if you should take aspirin to prevent heart disease.  Have regular diabetes screenings by checking your fasting blood sugar level. ? If you are at a normal weight and have a low risk for diabetes, have this test once every three years after the age of 31. ? If you are overweight and have a high risk for diabetes, consider being tested at a younger age or more often.  A one-time screening for abdominal aortic aneurysm (AAA) by ultrasound is recommended for men aged 36-75 years who are current or former smokers. What should I know about preventing infection? Hepatitis B If you have a higher risk for hepatitis B, you should be screened for this virus. Talk with your health care provider to find out if you are at risk for hepatitis B infection. Hepatitis C Blood testing is recommended for:  Everyone born from [birth year] through 1966.  Anyone with known risk factors for hepatitis C.  Sexually Transmitted Diseases (STDs)  You should be screened each year for STDs including gonorrhea and chlamydia if: ? You are sexually active and are younger than 72 years of age. ? You are older than 72 years of age and your health care provider tells you that you are at risk for this type of infection. ? Your sexual activity has changed since you were last screened and you are at an increased risk for chlamydia or gonorrhea. Ask your health care provider if you are at risk.  Talk with your health care provider about whether you are at high risk of being infected with HIV. Your health care provider may recommend a prescription medicine to help prevent HIV infection.  What else can I do?  Schedule regular health, dental, and eye exams.  Stay current with your vaccines (immunizations).  Do not use any tobacco products, such as  cigarettes, chewing tobacco, and e-cigarettes. If you need help quitting, ask your health care provider.  Limit alcohol intake to no more than 2 drinks per day. One drink equals 12 ounces of beer, 5 ounces of wine, or 1 ounces of hard liquor.  Do not use street drugs.  Do not share needles.  Ask your health care provider for help if you need support or information about quitting drugs.  Tell your health care provider if you often feel depressed.  Tell your health care provider if you have ever been abused or do not feel safe at home. This information is not intended to replace advice given to you by your health care provider. Make sure you discuss any questions you have with your health care provider. Document Released: 07/13/2007 Document Revised: 09/13/2015 Document Reviewed: 10/18/2014 Elsevier Interactive Patient Education  [employer].

## 2016-08-05 NOTE — Assessment & Plan Note (Signed)
Chronic R, worsened L since stroke 2014.

## 2016-08-05 NOTE — Assessment & Plan Note (Signed)
Discussed healthy diet and lifestyle changes to affect sustainable weight loss  

## 2016-08-05 NOTE — Assessment & Plan Note (Signed)

## 2016-08-05 NOTE — Addendum Note (Signed)
Addended by: Emelia Salisbury C on: 08/05/2016 10:59 AM   Modules accepted: Orders

## 2016-08-05 NOTE — Assessment & Plan Note (Addendum)
Ongoing deficiency. Not regular with oral replacement. Declines monthly B12 shots but agrees to shot today. I encouraged more regularity with oral replacement.

## 2016-08-05 NOTE — Assessment & Plan Note (Signed)
Advanced planning - packet provided today. HCPOA is wife or son. Does not want prolonged life support. If doesn't have capacity to make decisions would not want artificial prolongation of life. Wants to donate body to [facility].

## 2016-08-05 NOTE — Assessment & Plan Note (Signed)
Continue aspirin, statin, goal LDL <70.  Residual worsened L vision loss and mild memory trouble after stroke.

## 2016-08-08 DIAGNOSIS — E7800 Pure hypercholesterolemia, unspecified: Secondary | ICD-10-CM | POA: Diagnosis not present

## 2016-08-08 DIAGNOSIS — E538 Deficiency of other specified B group vitamins: Secondary | ICD-10-CM | POA: Diagnosis not present

## 2016-08-08 DIAGNOSIS — R739 Hyperglycemia, unspecified: Secondary | ICD-10-CM | POA: Diagnosis not present

## 2016-08-19 ENCOUNTER — Ambulatory Visit (INDEPENDENT_AMBULATORY_CARE_PROVIDER_SITE_OTHER): Payer: PPO | Admitting: Family Medicine

## 2016-08-19 ENCOUNTER — Encounter: Payer: Self-pay | Admitting: Family Medicine

## 2016-08-19 DIAGNOSIS — K5900 Constipation, unspecified: Secondary | ICD-10-CM

## 2016-08-19 MED ORDER — POLYETHYLENE GLYCOL 3350 17 GM/SCOOP PO POWD: ORAL | Status: DC

## 2016-08-19 MED ORDER — BISACODYL 5 MG PO TBEC: 5.0000 mg | DELAYED_RELEASE_TABLET | Freq: Every day | ORAL | Status: DC | PRN

## 2016-08-19 NOTE — Progress Notes (Signed)
Constipation.  Had been using miralax prn.  Sx started about 5 days, no BM in the last 4-5 days.  Still passing gas.  No abd pain.  No vomiting.  No sx like this prev, typically.  Appetite is still good.  Usually miralax will help, but the effect waned over the last few months.  No blood in stool.    Meds, vitals, and allergies reviewed.   ROS: Per HPI unless specifically indicated in ROS section   GEN: nad, alert and oriented NECK: supple w/o LA CV: rrr.  PULM: ctab, no inc wob ABD: soft, +bs, not ttp EXT: no edema

## 2016-08-19 NOTE — Patient Instructions (Signed)
I would take 1 cap of miralax twice today, 8 hours apart, with plenty of water.  You can take it 3 times a day if needed.  Eat vegetables and fruit in the meantime.  If needed, then add on dulcolax daily.  You can get that over the counter.  When improved, then cut back out dulcolax and drop the miralax back to 1/2 cap a day.  Take care.  Glad to see you.  Update [place] as needed.

## 2016-08-19 NOTE — Assessment & Plan Note (Addendum)
No emergent sx.  Take 1 cap of miralax twice today, 8 hours apart, with plenty of water.  He can take it 3 times a day if needed.  Eat vegetables and fruit in the meantime.  If needed, then add on dulcolax daily.  When improved, then cut back out dulcolax and drop the miralax back to 1/2 cap a day.  He agrees.

## 2016-08-21 DIAGNOSIS — H4031X3 Glaucoma secondary to eye trauma, right eye, severe stage: Secondary | ICD-10-CM | POA: Diagnosis not present

## 2016-08-21 DIAGNOSIS — H4031X4 Glaucoma secondary to eye trauma, right eye, indeterminate stage: Secondary | ICD-10-CM | POA: Diagnosis not present

## 2016-08-21 DIAGNOSIS — H40012 Open angle with borderline findings, low risk, left eye: Secondary | ICD-10-CM | POA: Diagnosis not present

## 2016-09-30 ENCOUNTER — Observation Stay (HOSPITAL_COMMUNITY): Payer: PPO

## 2016-09-30 ENCOUNTER — Encounter (HOSPITAL_COMMUNITY): Payer: Self-pay | Admitting: *Deleted

## 2016-09-30 ENCOUNTER — Observation Stay (HOSPITAL_COMMUNITY)
Admission: EM | Admit: 2016-09-30 | Discharge: 2016-10-01 | Disposition: A | Discharge status: Home / Self Care | Payer: PPO | Attending: Internal Medicine | Admitting: Internal Medicine

## 2016-09-30 DIAGNOSIS — R7303 Prediabetes: Secondary | ICD-10-CM | POA: Diagnosis not present

## 2016-09-30 DIAGNOSIS — I700 Atherosclerosis of aorta: Secondary | ICD-10-CM | POA: Insufficient documentation

## 2016-09-30 DIAGNOSIS — E785 Hyperlipidemia, unspecified: Secondary | ICD-10-CM | POA: Diagnosis not present

## 2016-09-30 DIAGNOSIS — M25511 Pain in right shoulder: Secondary | ICD-10-CM

## 2016-09-30 DIAGNOSIS — Z720 Tobacco use: Secondary | ICD-10-CM | POA: Diagnosis not present

## 2016-09-30 DIAGNOSIS — H4031X4 Glaucoma secondary to eye trauma, right eye, indeterminate stage: Secondary | ICD-10-CM

## 2016-09-30 DIAGNOSIS — I63532 Cerebral infarction due to unspecified occlusion or stenosis of left posterior cerebral artery: Secondary | ICD-10-CM | POA: Diagnosis not present

## 2016-09-30 DIAGNOSIS — Z79899 Other long term (current) drug therapy: Secondary | ICD-10-CM | POA: Insufficient documentation

## 2016-09-30 DIAGNOSIS — R29702 NIHSS score 2: Secondary | ICD-10-CM | POA: Insufficient documentation

## 2016-09-30 DIAGNOSIS — G453 Amaurosis fugax: Secondary | ICD-10-CM

## 2016-09-30 DIAGNOSIS — E538 Deficiency of other specified B group vitamins: Secondary | ICD-10-CM | POA: Diagnosis present

## 2016-09-30 DIAGNOSIS — G459 Transient cerebral ischemic attack, unspecified: Secondary | ICD-10-CM

## 2016-09-30 DIAGNOSIS — I6523 Occlusion and stenosis of bilateral carotid arteries: Secondary | ICD-10-CM | POA: Diagnosis not present

## 2016-09-30 DIAGNOSIS — E669 Obesity, unspecified: Secondary | ICD-10-CM | POA: Insufficient documentation

## 2016-09-30 DIAGNOSIS — Z7902 Long term (current) use of antithrombotics/antiplatelets: Secondary | ICD-10-CM | POA: Insufficient documentation

## 2016-09-30 DIAGNOSIS — Z6829 Body mass index (BMI) 29.0-29.9, adult: Secondary | ICD-10-CM | POA: Insufficient documentation

## 2016-09-30 DIAGNOSIS — E7800 Pure hypercholesterolemia, unspecified: Secondary | ICD-10-CM | POA: Diagnosis not present

## 2016-09-30 DIAGNOSIS — D126 Benign neoplasm of colon, unspecified: Secondary | ICD-10-CM | POA: Diagnosis present

## 2016-09-30 DIAGNOSIS — R2990 Unspecified symptoms and signs involving the nervous system: Secondary | ICD-10-CM

## 2016-09-30 DIAGNOSIS — H543 Unqualified visual loss, both eyes: Secondary | ICD-10-CM

## 2016-09-30 DIAGNOSIS — Z7982 Long term (current) use of aspirin: Secondary | ICD-10-CM | POA: Diagnosis not present

## 2016-09-30 DIAGNOSIS — H547 Unspecified visual loss: Secondary | ICD-10-CM | POA: Diagnosis not present

## 2016-09-30 DIAGNOSIS — I6930 Unspecified sequelae of cerebral infarction: Secondary | ICD-10-CM | POA: Diagnosis not present

## 2016-09-30 DIAGNOSIS — M25512 Pain in left shoulder: Secondary | ICD-10-CM | POA: Diagnosis not present

## 2016-09-30 DIAGNOSIS — I639 Cerebral infarction, unspecified: Secondary | ICD-10-CM | POA: Diagnosis not present

## 2016-09-30 DIAGNOSIS — I10 Essential (primary) hypertension: Secondary | ICD-10-CM | POA: Insufficient documentation

## 2016-09-30 DIAGNOSIS — H40012 Open angle with borderline findings, low risk, left eye: Secondary | ICD-10-CM | POA: Diagnosis present

## 2016-09-30 DIAGNOSIS — Z86010 Personal history of colonic polyps: Secondary | ICD-10-CM | POA: Diagnosis not present

## 2016-09-30 DIAGNOSIS — Z8673 Personal history of transient ischemic attack (TIA), and cerebral infarction without residual deficits: Secondary | ICD-10-CM | POA: Diagnosis not present

## 2016-09-30 DIAGNOSIS — F341 Dysthymic disorder: Secondary | ICD-10-CM | POA: Diagnosis present

## 2016-09-30 DIAGNOSIS — Z947 Corneal transplant status: Secondary | ICD-10-CM

## 2016-09-30 DIAGNOSIS — G8929 Other chronic pain: Secondary | ICD-10-CM | POA: Diagnosis not present

## 2016-09-30 HISTORY — DX: Prediabetes: R73.03

## 2016-09-30 HISTORY — DX: Legal blindness, as defined in USA: H54.8

## 2016-09-30 HISTORY — DX: Dysthymic disorder: F34.1

## 2016-09-30 LAB — RAPID URINE DRUG SCREEN, HOSP PERFORMED
Amphetamines: NOT DETECTED
BARBITURATES: NOT DETECTED
Benzodiazepines: NOT DETECTED
Cocaine: NOT DETECTED
Opiates: NOT DETECTED
Tetrahydrocannabinol: NOT DETECTED

## 2016-09-30 LAB — COMPREHENSIVE METABOLIC PANEL
ALBUMIN: 4.1 g/dL (ref 3.5–5.0)
ALK PHOS: 67 U/L (ref 38–126)
ALT: 16 U/L — ABNORMAL LOW (ref 17–63)
AST: 19 U/L (ref 15–41)
Anion gap: 11 (ref 5–15)
BILIRUBIN TOTAL: 1.8 mg/dL — AB (ref 0.3–1.2)
BUN: 10 mg/dL (ref 6–20)
CALCIUM: 9.3 mg/dL (ref 8.9–10.3)
CO2: 23 mmol/L (ref 22–32)
Chloride: 103 mmol/L (ref 101–111)
Creatinine, Ser: 0.98 mg/dL (ref 0.61–1.24)
GFR calc Af Amer: >60 mL/min (ref >60)
GLUCOSE: 109 mg/dL — AB (ref 65–99)
Potassium: 3.8 mmol/L (ref 3.5–5.1)
Sodium: 137 mmol/L (ref 135–145)
Total Protein: 7.3 g/dL (ref 6.5–8.1)

## 2016-09-30 LAB — DIFFERENTIAL
BASOS PCT: 0 %
Basophils Absolute: 0 10*3/uL (ref 0.0–0.1)
EOS ABS: 0.2 10*3/uL (ref 0.0–0.7)
EOS PCT: 3 %
Lymphocytes Relative: 25 %
Lymphs Abs: 1.6 10*3/uL (ref 0.7–4.0)
MONO ABS: 0.6 10*3/uL (ref 0.1–1.0)
Monocytes Relative: 10 %
NEUTROS PCT: 62 %
Neutro Abs: 3.9 10*3/uL (ref 1.7–7.7)

## 2016-09-30 LAB — PROTIME-INR
INR: 1
PROTHROMBIN TIME: 13.1 s (ref 11.4–15.2)

## 2016-09-30 LAB — I-STAT CHEM 8, ED
BUN: 12 mg/dL (ref 6–20)
CALCIUM ION: 1.12 mmol/L — AB (ref 1.15–1.40)
CHLORIDE: 102 mmol/L (ref 101–111)
Creatinine, Ser: 0.9 mg/dL (ref 0.61–1.24)
GLUCOSE: 109 mg/dL — AB (ref 65–99)
HCT: 46 % (ref 39.0–52.0)
Hemoglobin: 15.6 g/dL (ref 13.0–17.0)
Potassium: 3.7 mmol/L (ref 3.5–5.1)
Sodium: 137 mmol/L (ref 135–145)
TCO2: 23 mmol/L (ref 22–32)

## 2016-09-30 LAB — CBC
HCT: 44.5 % (ref 39.0–52.0)
Hemoglobin: 14.8 g/dL (ref 13.0–17.0)
MCH: 31.2 pg (ref 26.0–34.0)
MCHC: 33.3 g/dL (ref 30.0–36.0)
MCV: 93.9 fL (ref 78.0–100.0)
PLATELETS: 214 10*3/uL (ref 150–400)
RBC: 4.74 MIL/uL (ref 4.22–5.81)
RDW: 13.2 % (ref 11.5–15.5)
WBC: 6.3 10*3/uL (ref 4.0–10.5)

## 2016-09-30 LAB — CBG MONITORING, ED
GLUCOSE-CAPILLARY: 108 mg/dL — AB (ref 65–99)
GLUCOSE-CAPILLARY: 99 mg/dL (ref 65–99)

## 2016-09-30 LAB — TROPONIN I

## 2016-09-30 LAB — APTT: aPTT: 29 seconds (ref 24–36)

## 2016-09-30 LAB — I-STAT TROPONIN, ED: TROPONIN I, POC: 0.01 ng/mL (ref 0.00–0.08)

## 2016-09-30 MED ORDER — ATORVASTATIN CALCIUM 80 MG PO TABS
80.0000 mg | ORAL_TABLET | Freq: Every day | ORAL | Status: DC
  Administered 2016-09-30 – 2016-10-01 (×2): 80 mg via ORAL
  Filled 2016-09-30 (×2): qty 1

## 2016-09-30 MED ORDER — ACETAMINOPHEN 650 MG RE SUPP: 650.0000 mg | RECTAL | Status: DC | PRN

## 2016-09-30 MED ORDER — SENNOSIDES-DOCUSATE SODIUM 8.6-50 MG PO TABS: 1.0000 | ORAL_TABLET | Freq: Every evening | ORAL | Status: DC | PRN

## 2016-09-30 MED ORDER — IOPAMIDOL (ISOVUE-370) INJECTION 76%
INTRAVENOUS | Status: AC
  Administered 2016-09-30: 50 mL
  Filled 2016-09-30: qty 50

## 2016-09-30 MED ORDER — HEPARIN SODIUM (PORCINE) 5000 UNIT/ML IJ SOLN: 5000.0000 [IU] | Freq: Three times a day (TID) | INTRAMUSCULAR | Status: DC

## 2016-09-30 MED ORDER — QUINAPRIL HCL 10 MG PO TABS
20.0000 mg | ORAL_TABLET | Freq: Every day | ORAL | Status: DC
  Administered 2016-10-01: 20 mg via ORAL
  Filled 2016-09-30: qty 2

## 2016-09-30 MED ORDER — DORZOLAMIDE HCL-TIMOLOL MAL 2-0.5 % OP SOLN
1.0000 [drp] | Freq: Two times a day (BID) | OPHTHALMIC | Status: DC
  Administered 2016-10-01: 1 [drp] via OPHTHALMIC
  Filled 2016-09-30: qty 10

## 2016-09-30 MED ORDER — ASPIRIN EC 81 MG PO TBEC
81.0000 mg | DELAYED_RELEASE_TABLET | Freq: Every day | ORAL | Status: DC
  Administered 2016-10-01: 81 mg via ORAL
  Filled 2016-09-30: qty 1

## 2016-09-30 MED ORDER — ATORVASTATIN CALCIUM 20 MG PO TABS: 20.0000 mg | ORAL_TABLET | Freq: Every day | ORAL | Status: DC

## 2016-09-30 MED ORDER — HYDROCHLOROTHIAZIDE 12.5 MG PO CAPS
12.5000 mg | ORAL_CAPSULE | Freq: Every day | ORAL | Status: DC
  Administered 2016-10-01: 12.5 mg via ORAL
  Filled 2016-09-30: qty 1

## 2016-09-30 MED ORDER — STROKE: EARLY STAGES OF RECOVERY BOOK
Freq: Once | Status: DC
  Filled 2016-09-30: qty 1

## 2016-09-30 MED ORDER — ACETAMINOPHEN 160 MG/5ML PO SOLN: 650.0000 mg | ORAL | Status: DC | PRN

## 2016-09-30 MED ORDER — ACETAMINOPHEN 325 MG PO TABS: 650.0000 mg | ORAL_TABLET | ORAL | Status: DC | PRN

## 2016-09-30 MED ORDER — HYDRALAZINE HCL 20 MG/ML IJ SOLN: 5.0000 mg | Freq: Three times a day (TID) | INTRAMUSCULAR | Status: DC | PRN

## 2016-09-30 MED ORDER — SODIUM CHLORIDE 0.9 % IV SOLN
INTRAVENOUS | Status: DC
  Administered 2016-09-30 – 2016-10-01 (×2): via INTRAVENOUS

## 2016-09-30 MED ORDER — BRIMONIDINE TARTRATE 0.2 % OP SOLN
1.0000 [drp] | Freq: Two times a day (BID) | OPHTHALMIC | Status: DC
  Administered 2016-10-01: 1 [drp] via OPHTHALMIC
  Filled 2016-09-30: qty 5

## 2016-09-30 NOTE — H&P (Signed)
History and Physical    Evan Morales QMV:[id number] DOB: [birth year] DOA: 09/30/2016   PCP: Evan Bush, MD   Patient coming from:  Home    Chief Complaint: Blurred Vision   HPI: Evan Morales is a 71 y.o. 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 This blurred vision lasted for 3 hours, now resolved. He denies any headaches, or other strokelike symptoms. No TPA has been given, due to resolution of these symptoms.Denies tobacco, alcohol or recreational drug use. He is compliant with his medications. No new medications, or over-the-counter products. Denies presyncope or syncope. No recent infections Denies taking any hormonal supplements. [facility]   ED Course:  BP 122/75   Pulse (!) 58   Temp 97.8 F (36.6 C)   Resp 12   Ht 6' 1.5" (1.867 m)   Wt 101.6 kg (224 lb)   SpO2 99%   BMI 29.15 kg/m   Sodium 137 potassium 3.7 glucose 109 creatinine 0.9 troponin 0.01 white count 6.3  Prior carotid Doppler showed no significant stenosis MRI of the brain without contrast CT angiography of the head and neck, due to significant delay in the MRI department in the hospital Statin 80 mg Plavix 75 mg daily   Review of Systems:  As per HPI otherwise all other systems reviewed and are negative  Past Medical History:  Diagnosis Date  . 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  . CVA (cerebral vascular accident) ([place]) 03/27/2012   left occipital lobe - acute or subacute infarction. No hemorrhage. Residual numbness R index and middle finger and R periorally, residual memory  loss, worsened L vision  . Degenerative arthritis   . Dyslipidemia   . Dysthymia   . History of colon polyps 05/15/2005   tubular adenomas polyps  . Hypertension   . Legally blind   . Pre-diabetes     Past Surgical History:  Procedure Laterality Date  . CATARACT EXTRACTION  2013   left  . 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 ESOPHAGOGASTRODUODENOSCOPY  05/24/2005   mild erythema in antrum  . REPLACEMENT TOTAL KNEE  02/07   right, osteoarthritis    Social History Social History   Social History  . Marital status: Married    Spouse name: N/A  . Number of children: 2  . Years of education: N/A   Occupational History  . Sports Psychologist Other   Social History Main Topics  . Smoking status: Never Smoker  . Smokeless tobacco: Former Systems developer    Types: Chew  . Alcohol use No  . Drug use: No  . Sexual activity: No   Other Topics Concern  . Not on file   Social History Narrative   Caffeine: 2-3 cups coffee/day   Lives with wife.  (grown children)   Occupation: retired, was Occupational hygienist   Activity: golf, fishing   Diet: good water, fruits/vegetables     No Known Allergies  Family History  Problem Relation Age of Onset  . Cancer Mother        male  .  Alzheimer's disease Mother   . Glaucoma Father   . Coronary artery disease Neg Hx   . Stroke Neg Hx   . Diabetes Neg Hx   . Colon cancer Neg Hx       Prior to Admission medications   Medication Sig Start Date End Date Taking? Authorizing Provider  aspirin EC 81 MG tablet Take 81 mg by mouth daily.   Yes [provider]  atorvastatin (LIPITOR) 20 MG tablet Take 1 tablet (20 mg total) by mouth daily at 6 PM. 08/05/16  Yes Evan Bush, MD  brimonidine [facility]) 0.2 % ophthalmic solution Place 1 drop into the right eye 2 (two) times daily. 02/21/16 02/20/17 Yes [provider]  dorzolamide-timolol (COSOPT) 22.3-6.8 MG/ML ophthalmic solution Place 1 drop into the right eye 2 (two)  times daily. 02/21/16  Yes [provider]  hydrochlorothiazide (MICROZIDE) 12.5 MG capsule Take 1 capsule (12.5 mg total) by mouth daily. 08/05/16  Yes Evan Bush, MD  polyethylene glycol powder [facility]) powder Take 1/2 to a whole capful mixed in 8 oz fluid as needed for constipation, hold for diarrhea. Patient taking differently: Take 0.5 g by mouth daily as needed for mild constipation.  08/19/16  Yes Tonia Ghent, MD  quinapril (ACCUPRIL) 20 MG tablet Take 1 tablet (20 mg total) by mouth daily. 08/05/16  Yes Evan Bush, MD  bisacodyl (BISACODYL) 5 MG EC tablet Take 1 tablet (5 mg total) by mouth daily as needed for moderate constipation. 08/19/16   Tonia Ghent, MD  vitamin B-12 (CYANOCOBALAMIN) 1000 MCG tablet Take 1 tablet (1,000 mcg total) by mouth daily. Patient not taking: Reported on 09/30/2016 07/27/15   Evan Bush, MD    Physical Exam:  Vitals:   09/30/16 0936 09/30/16 1040 09/30/16 1122  BP: 114/78  122/75  Pulse: 63  (!) 58  Resp: 18  12  Temp: 97.8 F (36.6 C) 97.8 F (36.6 C)   TempSrc: Oral    SpO2: 97%  99%  Weight: 101.6 kg (224 lb)    Height: 6' 1.5" (1.867 m)     Constitutional: NAD, calm, comfortable  Eyes: Known right eye blindness, known left decrease eyesight, no trauma is seen. ENMT: Mucous membranes are moist, without exudate or lesions  Neck: normal, supple, no masses, no thyromegaly Respiratory: clear to auscultation bilaterally, no wheezing, no crackles. Normal respiratory effort  Cardiovascular: Regular rate and rhythm,  murmur, rubs or gallops. No extremity edema. 2+ pedal pulses. No carotid bruits.  Abdomen: Soft, non tender, No hepatosplenomegaly. Bowel sounds positive.  Musculoskeletal: no clubbing / cyanosis. Moves all extremities Skin: no jaundice, No lesions.  Neurologic: Sensation intact  Strength equal in all extremities. Known right eye blindness, known left decreased eyesight, no new  deficits Psychiatric:   Alert and oriented x 3. Normal mood.     Labs on Admission: I have personally reviewed following labs and imaging studies  CBC:  Recent Labs Lab 09/30/16 1007 09/30/16 1010  WBC 6.3  --   NEUTROABS 3.9  --   HGB 14.8 15.6  HCT 44.5 46.0  MCV 93.9  --   PLT 214  --     Basic Metabolic Panel:  Recent Labs Lab 09/30/16 1007 09/30/16 1010  NA 137 137  K 3.8 3.7  CL 103 102  CO2 23  --   GLUCOSE 109* 109*  BUN 10 12  CREATININE 0.98 0.90  CALCIUM 9.3  --     GFR: Estimated Creatinine Clearance: 95.1  mL/min (by C-G formula based on SCr of 0.9 mg/dL).  Liver Function Tests:  Recent Labs Lab 09/30/16 1007  AST 19  ALT 16*  ALKPHOS 67  BILITOT 1.8*  PROT 7.3  ALBUMIN 4.1   No results for input(s): LIPASE, AMYLASE in the last 168 hours. No results for input(s): AMMONIA in the last 168 hours.  Coagulation Profile:  Recent Labs Lab 09/30/16 1007  INR 1.00    Cardiac Enzymes: No results for input(s): CKTOTAL, CKMB, CKMBINDEX, TROPONINI in the last 168 hours.  BNP (last 3 results) No results for input(s): PROBNP in the last 8760 hours.  HbA1C: No results for input(s): HGBA1C in the last 72 hours.  CBG:  Recent Labs Lab 09/30/16 0944  GLUCAP 108*    Lipid Profile: No results for input(s): CHOL, HDL, LDLCALC, TRIG, CHOLHDL, LDLDIRECT in the last 72 hours.  Thyroid Function Tests: No results for input(s): TSH, T4TOTAL, FREET4, T3FREE, THYROIDAB in the last 72 hours.  Anemia Panel: No results for input(s): VITAMINB12, FOLATE, FERRITIN, TIBC, IRON, RETICCTPCT in the last 72 hours.  Urine analysis: No results found for: COLORURINE, APPEARANCEUR, LABSPEC, PHURINE, GLUCOSEU, HGBUR, BILIRUBINUR, KETONESUR, PROTEINUR, UROBILINOGEN, NITRITE, LEUKOCYTESUR  Sepsis Labs: @LABRCNTIP (procalcitonin:4,lacticidven:4) )No results found for this or any previous visit (from the past 240 hour(s)).   Radiological Exams on  Admission: No results found.  EKG: Independently reviewed.  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 H/o CVA in 2014, carotid artery stenosis , also h/o glaucoma. His symptoms are now resolved. No CT head was performed at the ED. No TPA has been given, due to resolution of these symptoms. NIH 2 Telemetry observation Check MRI brain without contrast CTA neck and head as recommended by Neuro due to delay in MRIs in hospital  2D echo troponins Neuro checks Increase lipitor to 80 mg daily  Check lipid panel and A1C   Hypertension BP 122/75   Pulse  58  Patient has taken his BP meds today prior to admission  Controlled, will allow permissive hypertension  Continue home anti-hypertensive medications in am Add Hydralazine Q6 hours as needed for BP  210/110   Hyperlipidemia Continue home statins, Lipitor has been increased to 80 mg daily    Glaucoma. Denies headaches or pain in his eyes. Continue eye drops  Prediabetes, not on any agents Glu 108  Monitor CBG bid.  A1C pending   DVT prophylaxis:  SCDs (not CT head was performed therefore awaiting MRI brain at this time ) Code Status:    Full  Family Communication:  Discussed with patient Disposition Plan: Expect patient to be discharged to home after condition improves Consults called:    Neuro as per EDP  Admission  status: Tele Obs    Said Rueb E, PA-C [facility]   09/30/2016, 11:43 AM

## 2016-09-30 NOTE — ED Notes (Signed)
Got patient undress on the monitor call bell in reach family at bedside

## 2016-09-30 NOTE — ED Notes (Signed)
Patient states he woke up this am with blurred vision in left eye, states he is legally blind in his right eye from a stroke 3 years ago. Denies pain or weakness. States his vision is normal at present. Bilateral grips strong and equal. Left pupil 3 and reaction

## 2016-09-30 NOTE — Progress Notes (Addendum)
Pt arrived to floor, denies pain. PT went to use the toilet, he states that he was trying to find the "flush" when he bumped his head on a "rail"  Assessed patient and he has a small spot of blood on  His head(frontal area). Cleaned with guaze and open to air.  will continue to monitor.

## 2016-09-30 NOTE — ED Triage Notes (Signed)
Pt reports going to sleep last night and waking this morning with blurred vision in his L eye, pts baselines is blind in the R eye, pt hx of CVA, denies slurred speech, VAN negative, pt A&O x4, no facial droop present, pts family reports he takes blood thinners

## 2016-09-30 NOTE — ED Notes (Addendum)
Wife, Fernado Brigante Home phone  [phone], Cell [phone]..  Wife going home, will come back later.

## 2016-09-30 NOTE — ED Notes (Signed)
Patient transported to MRI 

## 2016-09-30 NOTE — ED Notes (Signed)
Patient is OTF in CT

## 2016-09-30 NOTE — ED Provider Notes (Signed)
[facility] DEPT Provider Note   CSN: [id number] Arrival date & time: 09/30/16  0932     History   Chief Complaint Chief Complaint  Patient presents with  . Blurred Vision    HPI Sutter Ahlgren is a 71 y.o. male.  HPIpatient had blurred vision in his left eye this morning 6:30 AM symptoms lasted approximately an hour and resolve spontaneously he is presently asymptomatic without treatment. No other complaint. No difficulty speaking no focal numbness or weakness no loss of balance. No treatment prior to coming here.Patient is not on blood thinners besides aspirin presently.  Past Medical History:  Diagnosis Date  . 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  . CVA (cerebral vascular accident) ([place]) 03/27/2012   left occipital lobe - acute or subacute infarction. No hemorrhage. Residual numbness R index and middle finger and R periorally, residual memory loss, worsened L vision  . Degenerative arthritis   . Dyslipidemia   . History of colon polyps 05/15/2005   tubular adenomas polyps  . Hypertension     Patient Active Problem List   Diagnosis Date Noted  . Chronic pain of both shoulders 01/30/2016  . Vitamin B12 deficiency 07/27/2015  . Obesity, Class I, BMI 30-34.9 07/27/2015  . Advanced care planning/counseling discussion 07/26/2014  . Health maintenance examination 07/26/2014  . Dysthymia 07/26/2014  . Medicare annual wellness visit, subsequent 07/13/2012  . Constipation 04/09/2012  . Vision loss, bilateral 04/06/2012  . Bilateral carotid artery stenosis 04/06/2012  . History of cerebrovascular accident (CVA) with residual deficit 03/27/2012  . Prediabetes 07/05/2010  . HYPERCHOLESTEROLEMIA 05/04/2009  . ECZEMA 05/05/2007  . Essential hypertension 05/13/2006  . COLONIC POLYPS, ADENOMATOUS 05/15/2005    Past Surgical History:  Procedure Laterality Date  . CATARACT EXTRACTION  2013   left  . 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  ESOPHAGOGASTRODUODENOSCOPY  05/24/2005   mild erythema in antrum  . REPLACEMENT TOTAL KNEE  02/07   right, osteoarthritis       Home Medications    Prior to Admission medications   Medication Sig Start Date End Date Taking? Authorizing Provider  aspirin EC 81 MG tablet Take 81 mg by mouth daily.    [provider]  atorvastatin (LIPITOR) 20 MG tablet Take 1 tablet (20 mg total) by mouth daily at 6 PM. 08/05/16   Ria Bush, MD  bisacodyl (BISACODYL) 5 MG EC tablet Take 1 tablet (5 mg total) by mouth daily as needed for moderate constipation. 08/19/16   Tonia Ghent, MD  brimonidine (ALPHAGAN) 0.2 % ophthalmic solution Place 1 drop into the right eye 2 (two) times daily. 02/21/16 02/20/17  [provider]  dorzolamide-timolol (COSOPT) 22.3-6.8 MG/ML ophthalmic solution Place 1 drop into the right eye 2 (two) times daily. 02/21/16   [provider]  hydrochlorothiazide (MICROZIDE) 12.5 MG capsule Take 1 capsule (12.5 mg total) by mouth daily. 08/05/16   Ria Bush, MD  polyethylene glycol powder [facility]) powder Take 1/2 to a whole capful mixed in 8 oz fluid as needed for constipation, hold for diarrhea. 08/19/16   Tonia Ghent, MD  quinapril (ACCUPRIL) 20 MG tablet Take 1 tablet (20 mg total) by mouth daily. 08/05/16   Ria Bush, MD  vitamin B-12 (CYANOCOBALAMIN) 1000 MCG tablet Take 1 tablet (1,000 mcg total) by mouth daily. 07/27/15   Ria Bush, MD    Family History Family History  Problem Relation Age of Onset  .  Cancer Mother        male  . Alzheimer's disease Mother   . Glaucoma Father   . Coronary artery disease Neg Hx   . Stroke Neg Hx   . Diabetes Neg Hx   . Colon cancer Neg Hx     Social History Social History  Substance Use Topics  . Smoking status: Never Smoker  . Smokeless tobacco: Former Systems developer    Types: Chew  . Alcohol use No     Allergies   Patient has no known allergies.   Review of  Systems Review of Systems  Constitutional: Negative.   HENT: Negative.   Eyes: Positive for visual disturbance.       Blind in right eye as result ofright eye injury in fourth grade, chronically diminished vision in left eye as result of old stroke  Respiratory: Negative.   Cardiovascular: Negative.   Gastrointestinal: Negative.   Musculoskeletal: Negative.   Skin: Negative.   Neurological: Negative.        Memory loss as result old stroke  Psychiatric/Behavioral: Negative.   All other systems reviewed and are negative.    Physical Exam Updated Vital Signs BP 114/78 (BP Location: Left Arm)   Pulse 63   Temp 97.8 F (36.6 C) (Oral)   Resp 18   Ht 6' 1.5" (1.867 m)   Wt 101.6 kg (224 lb)   SpO2 97%   BMI 29.15 kg/m   Physical Exam  Constitutional: He is oriented to person, place, and time. He appears well-developed and well-nourished.  HENT:  Head: Normocephalic and atraumatic.  Eyes: Conjunctivae and EOM are normal.  Right eye pupil is fixed. Left eye pupil reactive to light. Visual acuity 20/60 left eye right eye he is vaguely able to perceive movement of my hand but cannot identify object as my hand  Neck: Neck supple. No tracheal deviation present. No thyromegaly present.  Cardiovascular: Normal rate and regular rhythm.   No murmur heard. Pulmonary/Chest: Effort normal and breath sounds normal.  Abdominal: Soft. Bowel sounds are normal. He exhibits no distension. There is no tenderness.  Musculoskeletal: Normal range of motion. He exhibits no edema or tenderness.  Neurological: He is alert and oriented to person, place, and time. Coordination normal.  Gait normal Romberg normal pronator drift normal finger to nose normal DTR symmetric bilaterally at knee jerk ankle jerk and biceps toes downgoing bilaterally  Skin: Skin is warm and dry. No rash noted.  Psychiatric: He has a normal mood and affect.  Nursing note and vitals reviewed.    ED Treatments / Results   Labs (all labs ordered are listed, but only abnormal results are displayed) Labs Reviewed  CBG MONITORING, ED - Abnormal; Notable for the following:       Result Value   Glucose-Capillary 108 (*)    All other components within normal limits  I-STAT CHEM 8, ED - Abnormal; Notable for the following:    Glucose, Bld 109 (*)    Calcium, Ion 1.12 (*)    All other components within normal limits  PROTIME-INR  APTT  CBC  DIFFERENTIAL  COMPREHENSIVE METABOLIC PANEL  I-STAT TROPONIN, ED    EKG  EKG Interpretation  Date/Time:  Monday September 30 2016 09:36:12 EDT Ventricular Rate:  65 PR Interval:  216 QRS Duration: 88 QT Interval:  402 QTC Calculation: 418 R Axis:   -10 Text Interpretation:  Sinus rhythm with 1st degree A-V block Anterolateral infarct , age undetermined Abnormal ECG No significant change  since last tracing Confirmed by Orlie Dakin [phone]) on 09/30/2016 10:14:44 AM       Radiology No results found.  Procedures Procedures (including critical care time)  Medications Ordered in ED Medications - No data to display  Results for orders placed or performed during the hospital encounter of 09/30/16  Protime-INR  Result Value Ref Range   Prothrombin Time 13.1 11.4 - 15.2 seconds   INR 1.00   APTT  Result Value Ref Range   aPTT 29 24 - 36 seconds  CBC  Result Value Ref Range   WBC 6.3 4.0 - 10.5 K/uL   RBC 4.74 4.22 - 5.81 MIL/uL   Hemoglobin 14.8 13.0 - 17.0 g/dL   HCT 44.5 39.0 - 52.0 %   MCV 93.9 78.0 - 100.0 fL   MCH 31.2 26.0 - 34.0 pg   MCHC 33.3 30.0 - 36.0 g/dL   RDW 13.2 11.5 - 15.5 %   Platelets 214 150 - 400 K/uL  Differential  Result Value Ref Range   Neutrophils Relative % 62 %   Neutro Abs 3.9 1.7 - 7.7 K/uL   Lymphocytes Relative 25 %   Lymphs Abs 1.6 0.7 - 4.0 K/uL   Monocytes Relative 10 %   Monocytes Absolute 0.6 0.1 - 1.0 K/uL   Eosinophils Relative 3 %   Eosinophils Absolute 0.2 0.0 - 0.7 K/uL   Basophils Relative 0 %    Basophils Absolute 0.0 0.0 - 0.1 K/uL  Comprehensive metabolic panel  Result Value Ref Range   Sodium 137 135 - 145 mmol/L   Potassium 3.8 3.5 - 5.1 mmol/L   Chloride 103 101 - 111 mmol/L   CO2 23 22 - 32 mmol/L   Glucose, Bld 109 (H) 65 - 99 mg/dL   BUN 10 6 - 20 mg/dL   Creatinine, Ser 0.98 0.61 - 1.24 mg/dL   Calcium 9.3 8.9 - 10.3 mg/dL   Total Protein 7.3 6.5 - 8.1 g/dL   Albumin 4.1 3.5 - 5.0 g/dL   AST 19 15 - 41 U/L   ALT 16 (L) 17 - 63 U/L   Alkaline Phosphatase 67 38 - 126 U/L   Total Bilirubin 1.8 (H) 0.3 - 1.2 mg/dL   GFR calc non Af Amer >60 >60 mL/min   GFR calc Af Amer >60 >60 mL/min   Anion gap 11 5 - 15  I-stat troponin, ED  Result Value Ref Range   Troponin i, poc 0.01 0.00 - 0.08 ng/mL   Comment 3          CBG monitoring, ED  Result Value Ref Range   Glucose-Capillary 108 (H) 65 - 99 mg/dL  I-Stat Chem 8, ED  Result Value Ref Range   Sodium 137 135 - 145 mmol/L   Potassium 3.7 3.5 - 5.1 mmol/L   Chloride 102 101 - 111 mmol/L   BUN 12 6 - 20 mg/dL   Creatinine, Ser 0.90 0.61 - 1.24 mg/dL   Glucose, Bld 109 (H) 65 - 99 mg/dL   Calcium, Ion 1.12 (L) 1.15 - 1.40 mmol/L   TCO2 23 22 - 32 mmol/L   Hemoglobin 15.6 13.0 - 17.0 g/dL   HCT 46.0 39.0 - 52.0 %   No results found. Initial Impression / Assessment and Plan / ED Course  I have reviewed the triage vital signs and the nursing notes.  Pertinent labs & imaging results that were available during my care of the patient were reviewed by me and considered in  my medical decision making (see chart for details).     Concern for TIA.I consulted Dr.Arora, Hospital neurologist who will see patient while in hospital. He suggests 23 hour observation overnight stayto include stroke workup including a CTA of head and neck. Hospitalist service consulted who will arrange for 23 hour observation. Telemetry  Final Clinical Impressions(s) / ED Diagnoses  Diagnosis transient ischemic attack Final diagnoses:  None     New Prescriptions New Prescriptions   No medications on file     Orlie Dakin, MD 09/30/16 1127

## 2016-09-30 NOTE — Consult Note (Signed)
Requesting Physician: Dr. Winfred Leeds,    Chief Complaint: blurred vision  History obtained from:  Patient    HPI:                                                                                                                                         Evan Morales is an 71 y.o. male who has known right eye blindness. Apparently since age 62 he had an accident which decrease the vision in his right eye however 3 years ago patient had a stroke which took out the rest of the vision in his right eye. It also decrease the vision in his left eye. Since his stroke 3 years ago he has had a right hemianopsia. 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 Patient does take a statin and a low dose aspirin daily. His last carotid Doppler showed no significant stenosis. Patient states he woke up at 6:00 this morning and around 700 hours he noted he had blurred vision in his left eye. The blurred vision was not one-sided it was throughout his whole vision. He told his wife who immediately drove him to the hospital. The blurred vision lasted for approximately 3 hours and then subsided and resolved. Patient had no headache with this and no other strokelike symptoms.  Date last known well: Date: 09/30/2016 Time last known well: Time: 07:00 tPA Given: No: Symptoms fully resolved NIH stroke scale of 2 Modified Rankin: Rankin Score=0    Past Medical History:  Diagnosis Date  . 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  . CVA (cerebral vascular accident) ([place]) 03/27/2012   left occipital lobe - acute or subacute infarction. No hemorrhage. Residual numbness R index and middle finger and R periorally, residual memory loss, worsened L vision  . Degenerative arthritis   . Dyslipidemia   . History of colon polyps 05/15/2005   tubular adenomas polyps  . Hypertension     Past Surgical History:  Procedure Laterality Date  . CATARACT EXTRACTION  2013   left  . 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 ESOPHAGOGASTRODUODENOSCOPY  05/24/2005   mild erythema in antrum  . 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 Cancer Mother        male  . Alzheimer's disease Mother   . Glaucoma Father   . Coronary artery disease Neg Hx   . Stroke Neg Hx   . Diabetes Neg Hx   . Colon cancer Neg Hx    Social History:  reports that he has never smoked. He has quit using smokeless tobacco. His smokeless tobacco use included Chew. He reports that he does not drink alcohol or use drugs.  Allergies: No Known Allergies  Medications:  No current facility-administered medications for this encounter.    Current Outpatient Prescriptions  Medication Sig Dispense Refill  . aspirin EC 81 MG tablet Take 81 mg by mouth daily.    Marland Kitchen atorvastatin (LIPITOR) 20 MG tablet Take 1 tablet (20 mg total) by mouth daily at 6 PM. 90 tablet 3  . brimonidine (ALPHAGAN) 0.2 % ophthalmic solution Place 1 drop into the right eye 2 (two) times daily.    . dorzolamide-timolol (COSOPT) 22.3-6.8 MG/ML ophthalmic solution Place 1 drop into the right eye 2 (two) times daily.    . hydrochlorothiazide (MICROZIDE) 12.5 MG capsule Take 1 capsule (12.5 mg total) by mouth daily. 90 capsule 3  . polyethylene glycol powder (GLYCOLAX/MIRALAX) powder Take 1/2 to a whole capful mixed in 8 oz fluid as needed for constipation, hold for diarrhea. (Patient taking differently: Take 0.5 g by mouth daily as needed for mild constipation. )    . quinapril (ACCUPRIL) 20 MG tablet Take 1 tablet (20 mg total) by mouth daily. 90 tablet 3  . bisacodyl (BISACODYL) 5 MG EC tablet Take 1 tablet (5 mg total) by mouth daily as needed for moderate constipation.    . vitamin B-12 (CYANOCOBALAMIN) 1000 MCG tablet Take 1 tablet (1,000 mcg total) by mouth daily. (Patient not taking:  Reported on 09/30/2016)       ROS:                                                                                                                                       History obtained from the patient  General ROS: negative for - chills, fatigue, fever, night sweats, weight gain or weight loss Psychological ROS: negative for - behavioral disorder, hallucinations, memory difficulties, mood swings or suicidal ideation Ophthalmic ROS: Positive for - blurry vision,  ENT ROS: negative for - epistaxis, nasal discharge, oral lesions, sore throat, tinnitus or vertigo Allergy and Immunology ROS: negative for - hives or itchy/watery eyes Hematological and Lymphatic ROS: negative for - bleeding problems, bruising or swollen lymph nodes Endocrine ROS: negative for - galactorrhea, hair pattern changes, polydipsia/polyuria or temperature intolerance Respiratory ROS: negative for - cough, hemoptysis, shortness of breath or wheezing Cardiovascular ROS: negative for - chest pain, dyspnea on exertion, edema or irregular heartbeat Gastrointestinal ROS: negative for - abdominal pain, diarrhea, hematemesis, nausea/vomiting or stool incontinence Genito-Urinary ROS: negative for - dysuria, hematuria, incontinence or urinary frequency/urgency Musculoskeletal ROS: negative for - joint swelling or muscular weakness Neurological ROS: as noted in HPI Dermatological ROS: negative for rash and skin lesion changes  Neurologic Examination:  Blood pressure 114/78, pulse 63, temperature 97.8 F (36.6 C), resp. rate 18, height 6' 1.5" (1.867 m), weight 101.6 kg (224 lb), SpO2 97 %.  HEENT-  Normocephalic, no lesions, without obvious abnormality.  Normal external eye and conjunctiva.  Normal TM's bilaterally.  Normal auditory canals and external ears. Normal external nose, mucus membranes and septum.  Normal  pharynx. Cardiovascular- S1, S2 normal, pulses palpable throughout   Lungs- chest clear, no wheezing, rales, normal symmetric air entry Abdomen- normal findings: bowel sounds normal Extremities- no edema Lymph-no adenopathy palpable Musculoskeletal-no joint tenderness, deformity or swelling Skin-warm and dry, no hyperpigmentation, vitiligo, or suspicious lesions  Neurological Examination Mental Status: Alert, oriented, thought content appropriate.  Speech fluent without evidence of aphasia.  Able to follow 3 step commands without difficulty. Cranial Nerves: II: Right  Eye visual acuity light perception in the right eye, 20/60 left eye. III,IV, VI: ptosis not present, extra-ocular motions intact bilaterally, pupils equal, round, reactive to light, the right eye has a significant cataract along with APD when shining light from left to right. Left pupil is briskly reactive V,VII: smile symmetric, facial light touch sensation normal bilaterally VIII: hearing normal bilaterally IX,X: uvula rises symmetrically XI: bilateral shoulder shrug XII: midline tongue extension Motor: Right : Upper extremity   5/5    Left:     Upper extremity   5/5  Lower extremity   5/5     Lower extremity   5/5 Tone and bulk:normal tone throughout; no atrophy noted Sensory: Pinprick and light touch intact throughout, bilaterally Deep Tendon Reflexes: 2+ and symmetric throughout Plantars: Right: downgoing   Left: downgoing Cerebellar: normal finger-to-nose, normal rapid alternating movements and normal heel-to-shin test Gait: Not tested       Lab Results: Basic Metabolic Panel:  Recent Labs Lab 09/30/16 1007 09/30/16 1010  NA 137 137  K 3.8 3.7  CL 103 102  CO2 23  --   GLUCOSE 109* 109*  BUN 10 12  CREATININE 0.98 0.90  CALCIUM 9.3  --     Liver Function Tests:  Recent Labs Lab 09/30/16 1007  AST 19  ALT 16*  ALKPHOS 67  BILITOT 1.8*  PROT 7.3  ALBUMIN 4.1   No results for input(s):  LIPASE, AMYLASE in the last 168 hours. No results for input(s): AMMONIA in the last 168 hours.  CBC:  Recent Labs Lab 09/30/16 1007 09/30/16 1010  WBC 6.3  --   NEUTROABS 3.9  --   HGB 14.8 15.6  HCT 44.5 46.0  MCV 93.9  --   PLT 214  --     Cardiac Enzymes: No results for input(s): CKTOTAL, CKMB, CKMBINDEX, TROPONINI in the last 168 hours.  Lipid Panel: No results for input(s): CHOL, TRIG, HDL, CHOLHDL, VLDL, LDLCALC in the last 168 hours.  CBG:  Recent Labs Lab 09/30/16 0944  GLUCAP 108*    Microbiology: Results for orders placed or performed in visit on 08/16/15  Fecal occult blood, imunochemical     Status: None   Collection Time: 08/16/15  1:41 PM  Result Value Ref Range Status   Fecal Occult Bld Negative Negative Final    Coagulation Studies:  Recent Labs  09/30/16 1007  LABPROT 13.1  INR 1.00    Imaging: No results found.     Assessment and plan discussed with with attending physician and they are in agreement.    Etta Quill PA-C [facility] [phone]  09/30/2016, 11:00 AM   Assessment: 71 y.o. male with  transient blurred vision in all visual fields. This lasted for approximately 3 hours and then fully resolved. Patient at baseline has Diminished visual acuity in the right eye, and is legally blind in that eye. As stated above his previous carotid Doppler showed no significant stenosis however at this time patient would highly benefit from a TIA/stroke workup.  Stroke Risk Factors - hyperlipidemia and hypertension  Recommend 1. HgbA1c, fasting lipid panel 2. MRI of the brain without contrast plus CTA of head and neck due to significant delay and MRI in the hospital 3. PT consult, OT consult, Speech consult 4. Echocardiogram 5. 80 mg of Atorvistatin 6. Prophylactic therapy-Antiplatelet med: Plavix 75 mg daily 7. Risk factor modification 8. Telemetry monitoring 9. Frequent neuro checks 10 NPO until passes stroke swallow  screen 11 please page stroke NP  Or  PA  Or MD from 8am -4 pm  as this patient from this time will be  followed by the stroke.   You can look them up on [email or web address]  Password [facility]   Attending addendum Patient seen and examined this afternoon in the emergency room. Briefly, 71 year old man, with baseline right eye blindness due to an injury that he sustained in the fourth grade, left eye decreased visual acuity due to a past stroke 3 years ago (etiology unknown), presented with blurred vision in his left eye that lasted for about a couple of hours and then completely resolved. His wife brought him to the emergency room for evaluation since his last stroke left him with decreased visual acuity in his left eye and she did not want him to have another stroke and lose sight from the only good eye that he had. Exam completely at baseline, hence not a candidate for TPA. No large vessel occlusion suspicion based on exam and hence not an endovascular therapy candidate. On examination Blood pressure 123/84 Heart rate 82 Breathing normally and saturating at 100% on room air. Neurological exam as listed above. I've made the necessary edits in the note. Assessment 71 year old man with transient blurred vision/visual loss in his left eye that lasted for a couple of hours and then completely resolved. 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 Given that his symptomatology can be traced to carotid stenosis and he has only one functioning I, I would recommend obtaining a TIA workup. Recommendations as above. Stroke team will continue to follow.

## 2016-09-30 NOTE — ED Notes (Signed)
Unable to do hourly rounding/q2h neuro check as patient is OTF

## 2016-10-01 ENCOUNTER — Observation Stay (HOSPITAL_BASED_OUTPATIENT_CLINIC_OR_DEPARTMENT_OTHER): Payer: PPO

## 2016-10-01 ENCOUNTER — Telehealth: Payer: Self-pay

## 2016-10-01 DIAGNOSIS — E538 Deficiency of other specified B group vitamins: Secondary | ICD-10-CM

## 2016-10-01 DIAGNOSIS — H40012 Open angle with borderline findings, low risk, left eye: Secondary | ICD-10-CM

## 2016-10-01 DIAGNOSIS — I6523 Occlusion and stenosis of bilateral carotid arteries: Secondary | ICD-10-CM | POA: Diagnosis not present

## 2016-10-01 DIAGNOSIS — R2990 Unspecified symptoms and signs involving the nervous system: Secondary | ICD-10-CM | POA: Diagnosis not present

## 2016-10-01 DIAGNOSIS — G453 Amaurosis fugax: Secondary | ICD-10-CM | POA: Diagnosis not present

## 2016-10-01 DIAGNOSIS — R7303 Prediabetes: Secondary | ICD-10-CM | POA: Diagnosis not present

## 2016-10-01 DIAGNOSIS — E7800 Pure hypercholesterolemia, unspecified: Secondary | ICD-10-CM

## 2016-10-01 DIAGNOSIS — F341 Dysthymic disorder: Secondary | ICD-10-CM | POA: Diagnosis not present

## 2016-10-01 DIAGNOSIS — D126 Benign neoplasm of colon, unspecified: Secondary | ICD-10-CM

## 2016-10-01 DIAGNOSIS — G459 Transient cerebral ischemic attack, unspecified: Secondary | ICD-10-CM | POA: Diagnosis not present

## 2016-10-01 DIAGNOSIS — I6930 Unspecified sequelae of cerebral infarction: Secondary | ICD-10-CM | POA: Diagnosis not present

## 2016-10-01 DIAGNOSIS — H543 Unqualified visual loss, both eyes: Secondary | ICD-10-CM | POA: Diagnosis not present

## 2016-10-01 DIAGNOSIS — I5030 Unspecified diastolic (congestive) heart failure: Secondary | ICD-10-CM

## 2016-10-01 DIAGNOSIS — I10 Essential (primary) hypertension: Secondary | ICD-10-CM | POA: Diagnosis not present

## 2016-10-01 DIAGNOSIS — H4031X4 Glaucoma secondary to eye trauma, right eye, indeterminate stage: Secondary | ICD-10-CM | POA: Diagnosis not present

## 2016-10-01 LAB — COMPREHENSIVE METABOLIC PANEL
ALT: 16 U/L — AB (ref 17–63)
AST: 17 U/L (ref 15–41)
Albumin: 3.8 g/dL (ref 3.5–5.0)
Alkaline Phosphatase: 62 U/L (ref 38–126)
Anion gap: 7 (ref 5–15)
BUN: 12 mg/dL (ref 6–20)
CHLORIDE: 106 mmol/L (ref 101–111)
CO2: 25 mmol/L (ref 22–32)
CREATININE: 0.93 mg/dL (ref 0.61–1.24)
Calcium: 8.9 mg/dL (ref 8.9–10.3)
GFR calc Af Amer: >60 mL/min (ref >60)
GFR calc non Af Amer: >60 mL/min (ref >60)
Glucose, Bld: 120 mg/dL — ABNORMAL HIGH (ref 65–99)
POTASSIUM: 3.5 mmol/L (ref 3.5–5.1)
SODIUM: 138 mmol/L (ref 135–145)
Total Bilirubin: 2 mg/dL — ABNORMAL HIGH (ref 0.3–1.2)
Total Protein: 6.5 g/dL (ref 6.5–8.1)

## 2016-10-01 LAB — ECHOCARDIOGRAM COMPLETE
HEIGHTINCHES: 73.5 in
WEIGHTICAEL: 3584 [oz_av]

## 2016-10-01 LAB — LIPID PANEL
CHOLESTEROL: 77 mg/dL (ref 0–200)
HDL: 28 mg/dL — AB (ref >40)
LDL Cholesterol: 36 mg/dL (ref 0–99)
Total CHOL/HDL Ratio: 2.8 RATIO
Triglycerides: 66 mg/dL (ref <150)
VLDL: 13 mg/dL (ref 0–40)

## 2016-10-01 LAB — CBC WITH DIFFERENTIAL/PLATELET
BASOS ABS: 0 10*3/uL (ref 0.0–0.1)
BASOS PCT: 0 %
EOS ABS: 0.2 10*3/uL (ref 0.0–0.7)
EOS PCT: 3 %
HCT: 42.5 % (ref 39.0–52.0)
Hemoglobin: 14.3 g/dL (ref 13.0–17.0)
LYMPHS ABS: 1.4 10*3/uL (ref 0.7–4.0)
LYMPHS PCT: 23 %
MCH: 31.8 pg (ref 26.0–34.0)
MCHC: 33.6 g/dL (ref 30.0–36.0)
MCV: 94.4 fL (ref 78.0–100.0)
MONOS PCT: 10 %
Monocytes Absolute: 0.6 10*3/uL (ref 0.1–1.0)
NEUTROS PCT: 64 %
Neutro Abs: 3.8 10*3/uL (ref 1.7–7.7)
PLATELETS: 187 10*3/uL (ref 150–400)
RBC: 4.5 MIL/uL (ref 4.22–5.81)
RDW: 13.1 % (ref 11.5–15.5)
WBC: 5.9 10*3/uL (ref 4.0–10.5)

## 2016-10-01 LAB — HEMOGLOBIN A1C
HEMOGLOBIN A1C: 5.7 % — AB (ref 4.8–5.6)
MEAN PLASMA GLUCOSE: 116.89 mg/dL

## 2016-10-01 LAB — PHOSPHORUS: PHOSPHORUS: 2.8 mg/dL (ref 2.5–4.6)

## 2016-10-01 LAB — MAGNESIUM: Magnesium: 2.1 mg/dL (ref 1.7–2.4)

## 2016-10-01 MED ORDER — ATORVASTATIN CALCIUM 80 MG PO TABS: 80.0000 mg | ORAL_TABLET | Freq: Every day | ORAL | 0 refills | Status: DC

## 2016-10-01 MED ORDER — CLOPIDOGREL BISULFATE 75 MG PO TABS: 75.0000 mg | ORAL_TABLET | Freq: Every day | ORAL | 0 refills | Status: DC

## 2016-10-01 MED ORDER — CLOPIDOGREL BISULFATE 75 MG PO TABS
75.0000 mg | ORAL_TABLET | Freq: Every day | ORAL | Status: DC
  Administered 2016-10-01: 75 mg via ORAL
  Filled 2016-10-01: qty 1

## 2016-10-01 NOTE — Discharge Summary (Signed)
Physician Discharge Summary  Evan Morales MOQ:[id number] DOB: [birth year] DOA: 09/30/2016  PCP: Ria Bush, MD  Admit date: 09/30/2016 Discharge date: 10/01/2016  Admitted From: Home Disposition: Home   Recommendations for Outpatient Follow-up:  1. Follow up with PCP in 1-2 weeks 2. Follow up with Neurology Dr. Leonie Man in 6 weeks 3. Please obtain CMP/CBC, Mag, Phos in one week  Home Health: No  Equipment/Devices: None    Discharge Condition: Stable  CODE STATUS: FULL CODE Diet recommendation: None  Brief/Interim Summary: Evan Morales is a 71 y.o. 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 This blurred vision lasted for 3 hours, now resolved. He denies any headaches, or other strokelike symptoms. No TPA has been given, due to resolution of these symptoms. Denied tobacco, alcohol or recreational drug use. He is compliant with his medications. No new medications, or over-the-counter products. Denies presyncope or syncope. No recent infections Denies taking any hormonal supplements. Admitted for TIA and worked up and improved. Neurology evaluated and recommended Changing ASA to Plavix. Patient had no needs and was deemed medically stable to D/C Home and will follow up with PCP and Neurology as an outpatient.   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 Asymmetric narrowing of the supraclinoid left internal carotid artery. High-grade stenosis of the proximal inferior left M2 branch. Asymmetric attenuation of the left MCA branch vessels compared to the left. Bilateral carotid bifurcation atherosclerotic changes without significant stenoses in the neck -MRI of the Brain showed No acute intracranial abnormality. Ischemic disease appears stable since the MRI in 2014, including large prior left PCA infarct and small infarcts of the right SCA territory, the left corona radiata, and left thalamus. Superimposed nonspecific cerebral white matter signal changes which may also be small vessel disease related have not significantly progressed. -ECHOCARDIOGRAM showed The cavity size was normal. There was moderate concentric hypertrophy. Systolic function was normal. The estimated ejection fraction was in the range of 55% to 60%. There was dynamic obstruction at rest, with a peak velocity of 71 cm/sec and a peak gradient of 2 mm Hg. Wall motion was normal; there were no regional wall motion abnormalities. Doppler parameters are consistent with abnormal left ventricular relaxation (grade 1 diastolic dysfunction). -Lipid Panel showed Cholesterol of 77, HDL of  28, LDL of 36, TG of 66, and VLDL of 13 -C/w Atorvastatin 20 mg po qHS -HbA1c was 5.7 -SLP had no Needs -PT/OT not consulted  because patient was walking in room without assistance -Patient changed from ASA to Plavix  -Neurology Recommending 6 week Follow up  Hypertension  -C/w Home Antihypertensive Medications  Hyperlipidemia -Lipid Panel as above -Continue home statin Glaucoma.  -Denies headaches or pain in his eyes. -Continue Home eye drops  Prediabetes -Not on any Oral Agents -HbA1c was 5.7  Hyperbilirubinemia -Went from 1.8 -> 2.0 -Follow up as an outpatient with PCP  Discharge Instructions  Discharge Instructions    Ambulatory referral to Neurology    Complete by:  As directed    An appointment is requested in approximately: 6 weeks   Call MD for:  difficulty breathing, headache or visual disturbances    Complete by:  As directed    Call MD for:  extreme fatigue    Complete by:  As directed    Call MD for:  hives    Complete by:  As directed    Call MD for:  persistant dizziness or light-headedness    Complete by:  As directed    Call MD for:  persistant nausea and vomiting    Complete by:  As directed    Call MD for:  redness, tenderness, or signs of infection (pain, swelling, redness, odor or green/yellow discharge around incision site)    Complete by:  As directed    Call MD for:  severe uncontrolled pain    Complete by:  As directed    Call MD for:  temperature >100.4    Complete by:  As directed    Diet - low sodium heart healthy    Complete by:  As directed    Discharge instructions    Complete by:  As directed    Follow up with PCP and Neurology within 6 weeks. Take all medications as prescribed. If symptoms change or worsen please return to the ED for evaluation.   Increase activity slowly    Complete by:  As directed      Allergies as of 10/01/2016   No Known Allergies     Medication List    STOP taking these medications   aspirin EC 81 MG tablet   vitamin B-12 1000 MCG tablet Commonly known as:  CYANOCOBALAMIN     TAKE these medications   atorvastatin 80 MG  tablet Commonly known as:  LIPITOR Take 1 tablet (80 mg total) by mouth daily at 6 PM. What changed:  medication strength  how much to take   bisacodyl 5 MG EC tablet Commonly known as:  bisacodyl Take 1 tablet (5 mg total) by mouth daily as needed for moderate constipation.   brimonidine 0.2 % ophthalmic solution Commonly known as:  ALPHAGAN Place 1 drop into the right eye 2 (two) times daily.   clopidogrel 75 MG tablet Commonly known as:  PLAVIX Take 1 tablet (75 mg total) by mouth daily.   dorzolamide-timolol 22.3-6.8 MG/ML ophthalmic solution Commonly known as:  COSOPT Place 1 drop into the right eye 2 (two) times daily.   hydrochlorothiazide 12.5 MG capsule Commonly known as:  MICROZIDE Take 1 capsule (12.5 mg total) by mouth daily.   polyethylene glycol powder powder Commonly known as:  GLYCOLAX/MIRALAX Take 1/2 to a whole capful mixed in 8 oz fluid as needed for constipation, hold for diarrhea. What changed:  how much to take  how  to take this  when to take this  reasons to take this  additional instructions   quinapril 20 MG tablet Commonly known as:  ACCUPRIL Take 1 tablet (20 mg total) by mouth daily.            Discharge Care Instructions        Start     Ordered   10/01/16 0000  atorvastatin (LIPITOR) 80 MG tablet  Daily-1800     10/01/16 1719   10/01/16 0000  clopidogrel (PLAVIX) 75 MG tablet  Daily     10/01/16 1719   10/01/16 0000  Increase activity slowly     10/01/16 1719   10/01/16 0000  Diet - low sodium heart healthy     10/01/16 1719   10/01/16 0000  Discharge instructions    Comments:  Follow up with PCP and Neurology within 6 weeks. Take all medications as prescribed. If symptoms change or worsen please return to the ED for evaluation.   10/01/16 1719   10/01/16 0000  Call MD for:  temperature >100.4     10/01/16 1719   10/01/16 0000  Call MD for:  persistant nausea and vomiting     10/01/16 1719   10/01/16 0000  Call MD  for:  severe uncontrolled pain     10/01/16 1719   10/01/16 0000  Call MD for:  redness, tenderness, or signs of infection (pain, swelling, redness, odor or green/yellow discharge around incision site)     10/01/16 1719   10/01/16 0000  Call MD for:  difficulty breathing, headache or visual disturbances     10/01/16 1719   10/01/16 0000  Call MD for:  hives     10/01/16 1719   10/01/16 0000  Call MD for:  persistant dizziness or light-headedness     10/01/16 1719   10/01/16 0000  Call MD for:  extreme fatigue     10/01/16 1719   10/01/16 0000  Ambulatory referral to Neurology    Comments:  An appointment is requested in approximately: 6 weeks   10/01/16 1719      No Known Allergies  Consultations:  Neurology/Stroke Team  Procedures/Studies: Ct Angio Head W Or Wo Contrast  Result Date: 09/30/2016 CLINICAL DATA:  Blurred vision of the left thigh lasting for 1 hour this morning with spontaneous resolution. No other focal symptoms. Chronic blindness of the right eye due to remote trauma. Personal history of CVA. EXAM: CT ANGIOGRAPHY HEAD AND NECK TECHNIQUE: Multidetector CT imaging of the head and neck was performed using the standard protocol during bolus administration of intravenous contrast. Multiplanar CT image reconstructions and MIPs were obtained to evaluate the vascular anatomy. Carotid stenosis measurements (when applicable) are obtained utilizing NASCET criteria, using the distal internal carotid diameter as the denominator. CONTRAST:  100 mL Isovue 370 COMPARISON:  MRI brain 04/22/2012. FINDINGS: CT HEAD FINDINGS Brain: Remote infarcts of the left occipital lobe are again noted. There is ex vacuo dilation of the occipital horn of the left lateral ventricle. No acute cortical infarct, hemorrhage, or mass lesion is present. The basal ganglia are intact. The insular ribbon is normal. Mild atrophy and white matter changes are again seen bilaterally. Vascular: Atherosclerotic  calcifications are present in the cavernous internal carotid arteries and at the dural margin of the left vertebral artery. There is no hyperdense vessel. Skull: Calvarium is intact. No focal lytic or blastic lesions are present. Sinuses: The paranasal sinuses are clear. Right mastoid air cells are clear. There  is some fluid in the left mastoid air cells. No obstructing at nasopharyngeal lesion is present. Orbits: A left lens replacement is noted. The globes and orbits are otherwise within normal limits. Review of the MIP images confirms the above findings CTA NECK FINDINGS Aortic arch: A 3 vessel arch configuration is present. Atherosclerotic changes are present in the aorta distal to the left subclavian artery without significant stenosis of the great vessel origins. There is noncalcified plaque at the origin of the left subclavian artery without a significant stenosis. Right carotid system: The right common carotid artery is within normal limits. Atherosclerotic calcifications are present in the distal right common carotid artery and at the right carotid bifurcation posteriorly and laterally without significant stenosis. There is mild tortuosity of the cervical right ICA without significant stenosis. Left carotid system: The left common carotid artery is within normal limits. Atherosclerotic changes are present in the proximal left ICA without a significant stenosis relative to the more distal vessel. There is distal tortuosity of the cervical left ICA without significant stenosis. Vertebral arteries: Vertebral arteries originate at subclavian arteries bilaterally without significant stenoses. The left vertebral artery is the dominant vessel. There is no significant stenosis of either vertebral artery in the neck. Skeleton: Endplate degenerative changes are most prominent C5-6 and C6-7 with left-sided foraminal narrowing. Facet spurring is worse on the right at C3-4 and C4-5. No focal lytic or blastic lesions are  present. Vertebral body heights and alignment are maintained. Other neck: The soft tissues of the neck are otherwise within normal limits. No significant mucosal or submucosal lesions are present. Salivary glands normal. Thyroid is unremarkable. Upper chest: The lung apices demonstrate minimal atelectasis. Superior mediastinum is unremarkable. Review of the MIP images confirms the above findings CTA HEAD FINDINGS Anterior circulation: Atherosclerotic calcifications are present within the cavernous internal carotid artery is bilaterally. There is irregularity and narrowing of the supraclinoid left ICA. There is no significant stenosis relative to the more distal segment but there is narrowing compared to the right. There is mild irregularity of the left A1 segment. Right A1 segment is dominant. The anterior communicating artery is patent. The right M1 segment is normal. There is mild narrowing of the proximal left M1 segment with an early bifurcation. High-grade stenosis is present proximally in the inferior left M2 division. There is asymmetric attenuation of versus left MCA branch vessels. Mild distal irregularity is present in the right MCA and bilateral ACA vessels without a significant proximal stenosis or occlusion. Posterior circulation: The left PICA origin is visualized and normal. Right PICA origin is not visualized. The vertebral basilar junction is normal. Basilar artery is normal. Both posterior cerebral arteries originate from the basilar tip. There is moderate stenosis in the proximal left posterior cerebral artery with asymmetric attenuation of distal PCA branch vessels. Venous sinuses: The dural sinuses are patent. Transverse sinuses are codominant. The straight sinus and deep cerebral veins are intact. Cortical veins are unremarkable. Anatomic variants: None Delayed phase: Postcontrast imaging demonstrates no pathologic enhancement. Review of the MIP images confirms the above findings IMPRESSION: 1.  Remote left occipital lobe infarcts with moderate proximal left PCA stenosis and attenuation of distal vessels. 2. Asymmetric narrowing of the supraclinoid left internal carotid artery. 3. High-grade stenosis of the proximal inferior left M2 branch. 4. Asymmetric attenuation of the left MCA branch vessels compared to the left. 5. Bilateral carotid bifurcation atherosclerotic changes without significant stenoses in the neck. Electronically Signed   By: Wynetta Fines.D.  On: 09/30/2016 13:13   Dg Chest 2 View  Result Date: 09/30/2016 CLINICAL DATA:  CVA like symptoms EXAM: CHEST  2 VIEW COMPARISON:  02/18/2005 chest radiograph. FINDINGS: Stable cardiomediastinal silhouette with normal heart size and aortic atherosclerosis. No pneumothorax. No pleural effusion. Lungs appear clear, with no acute consolidative airspace disease and no pulmonary edema. IMPRESSION: No active cardiopulmonary disease. Electronically Signed   By: Ilona Sorrel M.D.   On: 09/30/2016 12:57   Ct Angio Neck W Or Wo Contrast  Result Date: 09/30/2016 CLINICAL DATA:  Blurred vision of the left thigh lasting for 1 hour this morning with spontaneous resolution. No other focal symptoms. Chronic blindness of the right eye due to remote trauma. Personal history of CVA. EXAM: CT ANGIOGRAPHY HEAD AND NECK TECHNIQUE: Multidetector CT imaging of the head and neck was performed using the standard protocol during bolus administration of intravenous contrast. Multiplanar CT image reconstructions and MIPs were obtained to evaluate the vascular anatomy. Carotid stenosis measurements (when applicable) are obtained utilizing NASCET criteria, using the distal internal carotid diameter as the denominator. CONTRAST:  100 mL Isovue 370 COMPARISON:  MRI brain 04/22/2012. FINDINGS: CT HEAD FINDINGS Brain: Remote infarcts of the left occipital lobe are again noted. There is ex vacuo dilation of the occipital horn of the left lateral ventricle. No acute  cortical infarct, hemorrhage, or mass lesion is present. The basal ganglia are intact. The insular ribbon is normal. Mild atrophy and white matter changes are again seen bilaterally. Vascular: Atherosclerotic calcifications are present in the cavernous internal carotid arteries and at the dural margin of the left vertebral artery. There is no hyperdense vessel. Skull: Calvarium is intact. No focal lytic or blastic lesions are present. Sinuses: The paranasal sinuses are clear. Right mastoid air cells are clear. There is some fluid in the left mastoid air cells. No obstructing at nasopharyngeal lesion is present. Orbits: A left lens replacement is noted. The globes and orbits are otherwise within normal limits. Review of the MIP images confirms the above findings CTA NECK FINDINGS Aortic arch: A 3 vessel arch configuration is present. Atherosclerotic changes are present in the aorta distal to the left subclavian artery without significant stenosis of the great vessel origins. There is noncalcified plaque at the origin of the left subclavian artery without a significant stenosis. Right carotid system: The right common carotid artery is within normal limits. Atherosclerotic calcifications are present in the distal right common carotid artery and at the right carotid bifurcation posteriorly and laterally without significant stenosis. There is mild tortuosity of the cervical right ICA without significant stenosis. Left carotid system: The left common carotid artery is within normal limits. Atherosclerotic changes are present in the proximal left ICA without a significant stenosis relative to the more distal vessel. There is distal tortuosity of the cervical left ICA without significant stenosis. Vertebral arteries: Vertebral arteries originate at subclavian arteries bilaterally without significant stenoses. The left vertebral artery is the dominant vessel. There is no significant stenosis of either vertebral artery in the  neck. Skeleton: Endplate degenerative changes are most prominent C5-6 and C6-7 with left-sided foraminal narrowing. Facet spurring is worse on the right at C3-4 and C4-5. No focal lytic or blastic lesions are present. Vertebral body heights and alignment are maintained. Other neck: The soft tissues of the neck are otherwise within normal limits. No significant mucosal or submucosal lesions are present. Salivary glands normal. Thyroid is unremarkable. Upper chest: The lung apices demonstrate minimal atelectasis. Superior mediastinum is unremarkable. Review  of the MIP images confirms the above findings CTA HEAD FINDINGS Anterior circulation: Atherosclerotic calcifications are present within the cavernous internal carotid artery is bilaterally. There is irregularity and narrowing of the supraclinoid left ICA. There is no significant stenosis relative to the more distal segment but there is narrowing compared to the right. There is mild irregularity of the left A1 segment. Right A1 segment is dominant. The anterior communicating artery is patent. The right M1 segment is normal. There is mild narrowing of the proximal left M1 segment with an early bifurcation. High-grade stenosis is present proximally in the inferior left M2 division. There is asymmetric attenuation of versus left MCA branch vessels. Mild distal irregularity is present in the right MCA and bilateral ACA vessels without a significant proximal stenosis or occlusion. Posterior circulation: The left PICA origin is visualized and normal. Right PICA origin is not visualized. The vertebral basilar junction is normal. Basilar artery is normal. Both posterior cerebral arteries originate from the basilar tip. There is moderate stenosis in the proximal left posterior cerebral artery with asymmetric attenuation of distal PCA branch vessels. Venous sinuses: The dural sinuses are patent. Transverse sinuses are codominant. The straight sinus and deep cerebral veins are  intact. Cortical veins are unremarkable. Anatomic variants: None Delayed phase: Postcontrast imaging demonstrates no pathologic enhancement. Review of the MIP images confirms the above findings IMPRESSION: 1. Remote left occipital lobe infarcts with moderate proximal left PCA stenosis and attenuation of distal vessels. 2. Asymmetric narrowing of the supraclinoid left internal carotid artery. 3. High-grade stenosis of the proximal inferior left M2 branch. 4. Asymmetric attenuation of the left MCA branch vessels compared to the left. 5. Bilateral carotid bifurcation atherosclerotic changes without significant stenoses in the neck. Electronically Signed   By: San Morelle M.D.   On: 09/30/2016 13:13   Mr Brain Wo Contrast  Result Date: 09/30/2016 CLINICAL DATA:  71 year old male with blurred vision in the left eye acute onset this morning, symptoms slowly resolved. Chronic left PCA infarct and blindness in the right eye. EXAM: MRI HEAD WITHOUT CONTRAST TECHNIQUE: Multiplanar, multiecho pulse sequences of the brain and surrounding structures were obtained without intravenous contrast. COMPARISON:  CTA head and neck 1225 hours today. Brain MRI 04/22/2012. FINDINGS: Brain: Chronic left PCA territory encephalomalacia involving the left occipital and posterior temporal lobes with hemosiderin and ex vacuo enlargement of the left lateral ventricle. Chronic small infarcts of the posterior left corona radiata, central left thalamus, and right superior cerebellum are also stable. No restricted diffusion to suggest acute infarction. No midline shift, mass effect, evidence of mass lesion, extra-axial collection or acute intracranial hemorrhage. Cervicomedullary junction and pituitary are within normal limits. Scattered bilateral cerebral white matter nonspecific T2 and FLAIR hyperintensity has not significantly changed since 2014. No other cortical encephalomalacia identified. No other chronic cerebral blood products.  Vascular: Major intracranial vascular flow voids are stable since 2014. Dominant distal left vertebral artery. Skull and upper cervical spine: Negative. Normal bone marrow signal. Sinuses/Orbits: Stable postoperative changes to the left orbit and otherwise normal orbits soft tissues. Paranasal sinuses are stable and well pneumatized. Other: Chronic left mastoid effusion has not significantly changed. Negative nasopharynx. Other Visible internal auditory structures appear normal. Right mastoids remain clear. Scalp and face soft tissues appear negative. IMPRESSION: 1.  No acute intracranial abnormality. 2. Ischemic disease appears stable since the MRI in 2014, including large prior left PCA infarct and small infarcts of the right SCA territory, the left corona radiata, and left thalamus. 3.  Superimposed nonspecific cerebral white matter signal changes which may also be small vessel disease related have not significantly progressed. Electronically Signed   By: Genevie Ann M.D.   On: 09/30/2016 15:46    ECHOCARDIOGRAM  Study Conclusions  - Left ventricle: The cavity size was normal. There was moderate   concentric hypertrophy. Systolic function was normal. The   estimated ejection fraction was in the range of 55% to 60%. There   was dynamic obstruction at rest, with a peak velocity of 71   cm/sec and a peak gradient of 2 mm Hg. Wall motion was normal;   there were no regional wall motion abnormalities. Doppler   parameters are consistent with abnormal left ventricular   relaxation (grade 1 diastolic dysfunction). - Aortic valve: Transvalvular velocity was within the normal range.   There was no stenosis. There was trivial regurgitation. - Mitral valve: Transvalvular velocity was within the normal range.   There was no evidence for stenosis. There was trivial   regurgitation. - Right ventricle: The cavity size was normal. Wall thickness was   normal. Systolic function was normal. - Tricuspid valve:  There was trivial regurgitation. - Pulmonary arteries: Systolic pressure was within the normal   range.  Subjective: Seen and examined and doing well. No complaints and feels at baseline. Wanting to go home.  Discharge Exam: Vitals:   10/01/16 0916 10/01/16 1407  BP: 138/76 121/70  Pulse: 61 61  Resp: 18 20  Temp: (!) 97.5 F (36.4 C) 98.2 F (36.8 C)  SpO2: 97% 98%   Vitals:   10/01/16 0220 10/01/16 0450 10/01/16 0916 10/01/16 1407  BP: 128/86 110/83 138/76 121/70  Pulse: 63 (!) 55 61 61  Resp: 18 18 18 20   Temp:  97.7 F (36.5 C) (!) 97.5 F (36.4 C) 98.2 F (36.8 C)  TempSrc:  Oral Oral Oral  SpO2: 96% 97% 97% 98%  Weight:      Height:       General: Pt is alert, awake, not in acute distress Cardiovascular: RRR, S1/S2 +, no rubs, no gallops Respiratory: CTA bilaterally, no wheezing, no rhonchi Abdominal: Soft, NT, ND, bowel sounds + Extremities: no edema, no cyanosis  The results of significant diagnostics from this hospitalization (including imaging, microbiology, ancillary and laboratory) are listed below for reference.    Microbiology: No results found for this or any previous visit (from the past 240 hour(s)).   Labs: BNP (last 3 results) No results for input(s): BNP in the last 8760 hours. Basic Metabolic Panel:  Recent Labs Lab 09/30/16 1007 09/30/16 1010 10/01/16 1020  NA 137 137 138  K 3.8 3.7 3.5  CL 103 102 106  CO2 23  --  25  GLUCOSE 109* 109* 120*  BUN 10 12 12   CREATININE 0.98 0.90 0.93  CALCIUM 9.3  --  8.9  MG  --   --  2.1  PHOS  --   --  2.8   Liver Function Tests:  Recent Labs Lab 09/30/16 1007 10/01/16 1020  AST 19 17  ALT 16* 16*  ALKPHOS 67 62  BILITOT 1.8* 2.0*  PROT 7.3 6.5  ALBUMIN 4.1 3.8   No results for input(s): LIPASE, AMYLASE in the last 168 hours. No results for input(s): AMMONIA in the last 168 hours. CBC:  Recent Labs Lab 09/30/16 1007 09/30/16 1010 10/01/16 1020  WBC 6.3  --  5.9  NEUTROABS 3.9   --  3.8  HGB 14.8 15.6 14.3  HCT 44.5  46.0 42.5  MCV 93.9  --  94.4  PLT 214  --  187   Cardiac Enzymes:  Recent Labs Lab 09/30/16 1305  TROPONINI <0.03   BNP: Invalid input(s): POCBNP CBG:  Recent Labs Lab 09/30/16 0944 09/30/16 1252  GLUCAP 108* 99   D-Dimer No results for input(s): DDIMER in the last 72 hours. Hgb A1c  Recent Labs  10/01/16 0507  HGBA1C 5.7*   Lipid Profile  Recent Labs  10/01/16 0507  CHOL 77  HDL 28*  LDLCALC 36  TRIG 66  CHOLHDL 2.8   Thyroid function studies No results for input(s): TSH, T4TOTAL, T3FREE, THYROIDAB in the last 72 hours.  Invalid input(s): FREET3 Anemia work up No results for input(s): VITAMINB12, FOLATE, FERRITIN, TIBC, IRON, RETICCTPCT in the last 72 hours. Urinalysis No results found for: COLORURINE, APPEARANCEUR, LABSPEC, [place], GLUCOSEU, HGBUR, BILIRUBINUR, KETONESUR, PROTEINUR, UROBILINOGEN, NITRITE, LEUKOCYTESUR Sepsis Labs Invalid input(s): PROCALCITONIN,  WBC,  LACTICIDVEN Microbiology No results found for this or any previous visit (from the past 240 hour(s)).  Time coordinating discharge: 35 minutes  SIGNED:  Kerney Elbe, DO [facility] 10/01/2016, 5:19 PM Pager [phone]  If 7PM-7AM, please contact night-coverage [email or web address] Password [email or web address]

## 2016-10-01 NOTE — Progress Notes (Signed)
STROKE TEAM PROGRESS NOTE   HISTORY OF PRESENT ILLNESS (per record) Evan Morales is an 71 y.o. male with a history of legal blindness, hypertension, and hyperlipidemia who presented with new-onset blurred vision in his left eye.  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  Patient does not drive. Patient does take a statin and a low dose aspirin daily. His last carotid Doppler showed no significant stenosis. Patient states he woke up at around 0700 pm 09/30/2016 and noted he had blurred vision in his left eye. The blurred vision was not one-sided it was throughout his whole vision. He told his wife who immediately drove him to the hospital. The blurred vision lasted for approximately 3 hours and then subsided and resolved. Patient had no headache with this and no other strokelike symptoms.  Date last known well: Date: 09/30/2016 Time last known well: Time: 07:00 NIH stroke scale of 2 Modified Rankin: Rankin Score=0   Patient was not administered IV t-PA secondary to resolution of symtoms. He was admitted to General Neurology for further evaluation and treatment.   SUBJECTIVE (INTERVAL HISTORY) His wife is at the bedside.  The patient is alert, oriented, and follows all commands appropriately.   OBJECTIVE Temp:  [97.5 F (36.4 C)-98.2 F (36.8 C)] 98.2 F (36.8 C) (09/04 1407) Pulse Rate:  [55-63] 61 (09/04 1407) Cardiac Rhythm: Heart block (09/04 0700) Resp:  [16-20] 20 (09/04 1407) BP: (100-149)/(59-87) 121/70 (09/04 1407) SpO2:  [96 %-98 %] 98 % (09/04 1407)  CBC:   Recent Labs Lab 09/30/16 1007 09/30/16 1010 10/01/16 1020  WBC 6.3  --  5.9  NEUTROABS 3.9  --  3.8  HGB 14.8 15.6 14.3  HCT 44.5 46.0 42.5  MCV 93.9  --  94.4  PLT 214  --  557    Basic Metabolic Panel:   Recent Labs Lab 09/30/16 1007 09/30/16 1010 10/01/16 1020  NA 137 137 138  K 3.8 3.7  3.5  CL 103 102 106  CO2 23  --  25  GLUCOSE 109* 109* 120*  BUN 10 12 12   CREATININE 0.98 0.90 0.93  CALCIUM 9.3  --  8.9  MG  --   --  2.1  PHOS  --   --  2.8    Lipid Panel:     Component Value Date/Time   CHOL 77 10/01/2016 0507   TRIG 66 10/01/2016 0507   HDL 28 (L) 10/01/2016 0507   CHOLHDL 2.8 10/01/2016 0507   VLDL 13 10/01/2016 0507   LDLCALC 36 10/01/2016 0507   HgbA1c:  Lab Results  Component Value Date   HGBA1C 5.7 (H) 10/01/2016   Urine Drug Screen:     Component Value Date/Time   LABOPIA NONE DETECTED 09/30/2016 1309   COCAINSCRNUR NONE DETECTED 09/30/2016 1309   LABBENZ NONE DETECTED 09/30/2016 1309   AMPHETMU NONE DETECTED 09/30/2016 1309   THCU NONE DETECTED 09/30/2016 1309   LABBARB NONE DETECTED 09/30/2016 1309    Alcohol Level No results found for: ETH  IMAGING  Ct Angio Head W Or Wo Contrast Ct Angio Neck W Or Wo Contrast 09/30/2016 IMPRESSION: 1. Remote left occipital lobe infarcts with moderate proximal left PCA stenosis and attenuation of distal vessels. 2. Asymmetric narrowing of the supraclinoid left internal carotid artery. 3. High-grade stenosis of the proximal inferior left M2 branch. 4. Asymmetric attenuation of the left MCA branch  vessels compared to the left. 5. Bilateral carotid bifurcation atherosclerotic changes without significant stenoses in the neck.  Dg Chest 2 View 09/30/2016 IMPRESSION: No active cardiopulmonary disease.  Mr Brain Wo Contrast 09/30/2016 IMPRESSION: 1.  No acute intracranial abnormality. 2. Ischemic disease appears stable since the MRI in 2014, including large prior left PCA infarct and small infarcts of the right SCA territory, the left corona radiata, and left thalamus. 3. Superimposed nonspecific cerebral white matter signal changes which may also be small vessel disease related have not significantly progressed.  TTE 10/01/2016 Study Conclusions - Left ventricle: The cavity size was normal. There was  moderate   concentric hypertrophy. Systolic function was normal. The   estimated ejection fraction was in the range of 55% to 60%. There   was dynamic obstruction at rest, with a peak velocity of 71   cm/sec and a peak gradient of 2 mm Hg. Wall motion was normal;   there were no regional wall motion abnormalities. Doppler   parameters are consistent with abnormal left ventricular   relaxation (grade 1 diastolic dysfunction). - Aortic valve: Transvalvular velocity was within the normal range.   There was no stenosis. There was trivial regurgitation. - Mitral valve: Transvalvular velocity was within the normal range.   There was no evidence for stenosis. There was trivial   regurgitation. - Right ventricle: The cavity size was normal. Wall thickness was   normal. Systolic function was normal. - Tricuspid valve: There was trivial regurgitation. - Pulmonary arteries: Systolic pressure was within the normal   range.     PHYSICAL EXAM Pleasant elderly Caucasian male sitting comfortably in a bedside chair. Not in distress. . Afebrile. Head is nontraumatic. Neck is supple without bruit.    Cardiac exam no murmur or gallop. Lungs are clear to auscultation. Distal pulses are well felt. Neurological Exam ;  Awake  Alert oriented x 3. Normal speech and language.eye movements full without nystagmus.fundi were not visualized.  Patient is legally blind in the right eye with light perception only. Vision acuity is also decrease in the left eye.  Marland Kitchen Hearing is normal. Palatal movements are normal. Face symmetric. Tongue midline. Normal strength, tone, reflexes and coordination. Normal sensation. Gait deferred.  ASSESSMENT/PLAN Mr. Evan Morales is a 71 y.o. male with history of history of legal blindness, hypertension, and hyperlipidemia who presented with new-onset blurred vision in his left eye He did not receive IV t-PA due to resolution of symtoms.   TIA: No acute lesion identified on imaging,  in the setting of moderate proximal L PCA stenosis and high-grade stenosis of the L M2 branch.  Resultant  no deficits  CT head: not performed  MRI head: no acute stroke  MRA head: not performed  CTA head/neck:  moderate proximal L PCA stenosis and high-grade stenosis of the L M2 branch   2D Echo: EF 55-60%. No source of embolus   LDL 36  HgbA1c 5.7  SCDs for VTE prophylaxis Diet Heart Room service appropriate? Yes; Fluid consistency: Thin Diet - low sodium heart healthy  aspirin 81 mg daily prior to admission, now on clopidogrel 75 mg daily  Patient counseled to be compliant with his antithrombotic medications  Ongoing aggressive stroke risk factor management  Therapy recommendations: none  Disposition: pending  Hypertension  Stable  Permissive hypertension (OK if < 220/120) but gradually normalize in 5-7 days  Long-term BP goal normotensive  Hyperlipidemia  Home meds:  Atorvastatin 20 mg PO daily, resumed in hospital  LDL 36, goal < 70  Continue statin at discharge  Other Stroke Risk Factors  Advanced age  Borderline obesity, Body mass index is 29.15 kg/m., recommend weight loss, diet and exercise as appropriate   Hx stroke/TIA  Other Active Problems  None  Hospital day # 0  I have personally examined this patient, reviewed notes, independently viewed imaging studies, participated in medical decision making and plan of care.ROS completed by me personally and pertinent positives fully documented  I have made any additions or clarifications directly to the above note.  He presented with transient blurred vision in the left eye possibly a TIA. He does have history of intracranial stenosis and I recommend changing aspirin to Plavix for stroke prevention. Check echocardiogram. Follow-up as an outpatient in stroke clinic. Greater than 50% time during this 25 minute visit was spent on counseling and coordination of care about his TIA and vision loss and  answering questions. Discussed with Dr. Assunta Gambles, [place] Pager: [phone] 10/01/2016 6:09 PM   To contact Stroke Continuity provider, please refer to [email or web address] After hours, contact General Neurology

## 2016-10-01 NOTE — Care Management Note (Signed)
Case Management Note  Patient Details  Name: Evan Morales MRN: [id number] Date of Birth: September 28, 1945  Subjective/Objective:   Pt in with TIA symptoms. He is from home with his spouse.                 Action/Plan: Plan is for patient to return home when medically ready. CM following for d/c needs, physician orders.   Expected Discharge Date:                  Expected Discharge Plan:  Home/Self Care  In-House Referral:     Discharge planning Services     Post Acute Care Choice:    Choice offered to:     DME Arranged:    DME Agency:     HH Arranged:    HH Agency:     Status of Service:  In process, will continue to follow  If discussed at Long Length of Stay Meetings, dates discussed:    Additional Comments:  Pollie Friar, RN 10/01/2016, 10:36 AM

## 2016-10-01 NOTE — Telephone Encounter (Signed)
Patient seen in ER and Admitted on 09/30/16.     Patient Name: DYLAN MONFORTE [facility] Gender: Male DOB: 04/16/1945 Age: 71 Y 10 M 1 D Return Phone Number: [phone] (Primary), [phone] (Secondary) City/State/Zip: [place] [facility] [place] [facility] - Client Client Site [place] - Day Physician Ria Bush - MD Who Is Calling Patient / Member / Family / Caregiver Call Type Triage / Clinical Relationship To Patient Self Return Phone Number [phone] (Primary) Chief Complaint VISION - sudden loss or sudden decrease (NOT blurred vision) Reason for Call Symptomatic / Request for [place] states husband had a stroke 2-3 yrs ago, gave him vision issues. Now vision in good eye is blurred/seeing less. Appointment Disposition EMR Appointment Not Necessary Info pasted into Epic No Nurse Assessment Nurse: Luther Parody, RN, Malachy Mood Date/Time (Eastern Time): 09/30/2016 8:56:36 AM Confirm and document reason for call. If symptomatic, describe symptoms. ---Caller states that he has had limited vision in his right eye since childhood and following a stroke a few yrs ago he lost complete vision in the eye. This am he woke with blurred vision in the left eye. Denies any other s/s. Does the PT have any chronic conditions? (i.e. diabetes, asthma, etc.) ---Yes List chronic conditions. ---CVA, htn, high cholesterol Guidelines Guideline Title Affirmed Question Vision Loss or Change [1] Blurred vision or visual changes AND [2] present now AND [3] sudden onset or new (e.g., minutes, hours, days) (Exception: seeing floaters / black specks OR previously diagnosed migraine headaches with same symptoms) Disp. Time Eilene Ghazi Time) Disposition Final User 09/30/2016 9:03:37 AM Go to ED Now (or PCP triage) Yes Luther Parody, RN, Malachy Mood Referrals GO TO FACILITY UNDECIDED Care Advice Given Per Guideline DRIVING: Another adult should drive.  CARE ADVICE given per Vision Loss or Change (Adult) guideline. GO TO ED NOW (OR PCP TRIAGE):

## 2016-10-01 NOTE — Progress Notes (Signed)
  Echocardiogram 2D Echocardiogram has been performed.  Cassandra Mcmanaman L Androw 10/01/2016, 1:39 PM

## 2016-10-01 NOTE — Progress Notes (Signed)
SLP Cancellation Note  Patient Details Name: Evan Morales MRN: [id number] DOB: [birth year]   Cancelled treatment:       Reason Eval/Treat Not Completed: SLP screened, no needs identified, will sign off   Vesna Kable 10/01/2016, 9:46 AM

## 2016-10-01 NOTE — Plan of Care (Deleted)
Problem: Education: Goal: Knowledge of [place] [facility] Education information/materials will improve Outcome: Progressing POC reviewed with pt.   

## 2016-10-01 NOTE — Care Management Obs Status (Signed)
[place] NOTIFICATION   Patient Details  Name: Evan Morales MRN: [id number] Date of Birth: 05/16/1945   Medicare Observation Status Notification Given:  Yes    Carles Collet, RN 10/01/2016, 10:07 AM

## 2016-10-01 NOTE — Plan of Care (Signed)
Problem: Education: Goal: Knowledge of [place] [facility] Education information/materials will improve Outcome: Progressing POC reviewed with pt.   

## 2016-10-01 NOTE — Progress Notes (Signed)
Discharge orders received.  Discharge instructions and follow-up appointments reviewed with the patient and his wife.  VSS upon discharge.  IV removed and education complete.  All belongings sent with the patient.  Transported out via wheelchair.   Bayleigh Loflin M, RN 

## 2016-10-02 ENCOUNTER — Telehealth: Payer: Self-pay

## 2016-10-02 NOTE — Telephone Encounter (Signed)
Transition Care Management Follow-up Telephone Call   Date discharged? 10/01/16   How have you been since you were released from the hospital? "doing pretty well"   Do you understand why you were in the hospital? yes   Do you understand the discharge instructions? yes   Where were you discharged to? Home. Lives with wife.    Items Reviewed:  Medications reviewed: yes  Allergies reviewed: yes  Dietary changes reviewed: yes  Referrals reviewed: yes   Functional Questionnaire:   Activities of Daily Living (ADLs):   He states they are independent in the following: ambulation, bathing and hygiene, feeding, continence, grooming, toileting and dressing States they require assistance with the following: None   Any transportation issues/concerns?: yes, Pt unable to drive since stroke. Wife is having hip surgery this week, therefore pt unable to schedule appts.    Any patient concerns? no   Confirmed importance and date/time of follow-up visits scheduled no. Patient states he is unable to make f/u appt at this time d/t wife's upcoming hip surgery. Will call for f/u appt if needed.   Confirmed with patient if condition begins to worsen call PCP or go to the ER.  Patient was given the office number and encouraged to call back with question or concerns.  : yes

## 2016-10-03 ENCOUNTER — Telehealth: Payer: Self-pay

## 2016-10-03 NOTE — Telephone Encounter (Signed)
Agree this could be statin related. Let's try cutting statin in half (to 40mg  daily) and let us know if this is tolerated better.

## 2016-10-03 NOTE — Telephone Encounter (Signed)
Pt was admitted to [facility] recently for blurred vision; pt has hx of CVA but pt said this time he did not have stroke; pt is going to cb to schedule f/u appt with Dr Darnell Level but now pt is having a lot of soreness in bones and muscles and was advised could be due to statin drug. Pt had been taking atorvastatin 10 mg and in the hospital was increased to atorvastatin 80 mg. Pt request cb. [place].

## 2016-10-04 NOTE — Telephone Encounter (Signed)
Spoke to pt and advised per Dr Darnell Level. States he will contact office back with status after starting new dose

## 2016-11-04 ENCOUNTER — Telehealth: Payer: Self-pay

## 2016-11-04 NOTE — Telephone Encounter (Signed)
Pt left v/m; pt was seen at hospital few weeks ago and atorvastatin was increased from 20 mg to 80 mg; pt was already having joint problems with 20 mg and pt wants to verify with Dr Darnell Level that he should take the atorvastatin 80 mg even though having joint problems when taking 20 mg. Pt request cb. No future appt scheduled this year.

## 2016-11-04 NOTE — Telephone Encounter (Signed)
Would have him at least try the 80mg  dose and check with Dr Leonie Man about this at appt in 2 wks.

## 2016-11-04 NOTE — Telephone Encounter (Signed)
Noted. Fwd message to Dr. Darnell Level.

## 2016-11-04 NOTE — Telephone Encounter (Signed)
Pt came by office for answer to his question earlier today. Pt notified as instructed. Pt said he cannot raise his arms above shoulder level now and pt is concerned about starting atorvastatin 80 mg. Pt said he is going to wait to start the 80 mg until after talks with Dr Leonie Man at his appt in 2 wks.FYI to Dr Darnell Level.

## 2016-11-19 ENCOUNTER — Encounter: Payer: Self-pay | Admitting: Neurology

## 2016-11-19 ENCOUNTER — Encounter (INDEPENDENT_AMBULATORY_CARE_PROVIDER_SITE_OTHER): Payer: Self-pay

## 2016-11-19 ENCOUNTER — Ambulatory Visit (INDEPENDENT_AMBULATORY_CARE_PROVIDER_SITE_OTHER): Payer: PPO | Admitting: Neurology

## 2016-11-19 VITALS — BP 106/69 | HR 64 | Ht 73.0 in | Wt 229.8 lb

## 2016-11-19 DIAGNOSIS — I63421 Cerebral infarction due to embolism of right anterior cerebral artery: Secondary | ICD-10-CM

## 2016-11-19 MED ORDER — CO-ENZYME Q-10 50 MG PO CAPS: 200.0000 mg | ORAL_CAPSULE | Freq: Every day | ORAL | 1 refills | Status: DC

## 2016-11-19 NOTE — Patient Instructions (Addendum)
I had a long d/w patient and his wife about his recent episode of possible TIA,remote stroke and vision loss, risk for recurrent stroke/TIAs, personally independently reviewed imaging studies and stroke evaluation results and answered questions.Continue Plavix 75 mg daily but stop aspirin for secondary stroke prevention and maintain strict control of hypertension with blood pressure goal below 130/90, diabetes with hemoglobin A1c goal below 6.5% and lipids with LDL cholesterol goal below 70 mg/dL. I also advised the patient to eat a healthy diet with plenty of whole grains, cereals, fruits and vegetables, exercise regularly and maintain ideal body weight . I advised him to start taking co-enzyme Q10 200 mg daily to help with his statin related myalgias. I also advised him to have follow-up lipid profile checked by his cardiologist at upcoming visit. If lipid profile is not satisfactorily controlled or he has persistent statin myalgias may consider switching to the new PCSK 9 inhibitor injections in the future.Followup in the future with my nurse practitioner in 6 months or call earlier if necessary.
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
## 2016-11-20 ENCOUNTER — Other Ambulatory Visit: Payer: Self-pay

## 2016-11-20 MED ORDER — CLOPIDOGREL BISULFATE 75 MG PO TABS: 75.0000 mg | ORAL_TABLET | Freq: Every day | ORAL | 3 refills | Status: DC

## 2016-12-03 ENCOUNTER — Ambulatory Visit (INDEPENDENT_AMBULATORY_CARE_PROVIDER_SITE_OTHER): Payer: PPO

## 2016-12-03 DIAGNOSIS — Z23 Encounter for immunization: Secondary | ICD-10-CM | POA: Diagnosis not present

## 2017-01-13 ENCOUNTER — Telehealth: Payer: Self-pay | Admitting: Family Medicine

## 2017-01-13 MED ORDER — ATORVASTATIN CALCIUM 40 MG PO TABS: 40.0000 mg | ORAL_TABLET | Freq: Every day | ORAL | 3 refills | Status: DC

## 2017-01-13 NOTE — Telephone Encounter (Signed)
Dr. Danise Mina needs to change the Medication order. Thanks.

## 2017-01-13 NOTE — Telephone Encounter (Signed)
Copied from [place] [id number]. Topic: Quick Communication - Rx Refill/Question >> Jan 13, 2017  9:03 AM Ahmed Prima L wrote: Has the patient contacted their pharmacy? Yes & they told him that since he is normally taking 40mg  not 80 and they advised him to call his dr bc the dr needs to send over the exact new script for 40mg  not 80mg . He said DR is aware that he has been cutting them in half. Medicine is LIPITOR. Pharmacy is [facility] [address]: Please be advised that RX refills may take up to 3 business days. We ask that you follow-up with your pharmacy.

## 2017-01-13 NOTE — Telephone Encounter (Signed)
Sent in new dose.

## 2017-01-14 ENCOUNTER — Telehealth: Payer: Self-pay | Admitting: Family Medicine

## 2017-01-14 NOTE — Telephone Encounter (Signed)
Copied from [place]. Topic: Inquiry >> Jan 14, 2017  1:00 PM Pricilla Handler wrote: Reason for CRM: Patient called requesting a refill of Atorvastatin (LIPITOR) 40 MG tablet. Patient has only 2 day left of medication. [place], [place], [place] [phone] (Phone) [phone] (Fax). Please call the patient once refill has been sent to the pharmacy.       Thank You!!!

## 2017-03-31 DIAGNOSIS — H4031X4 Glaucoma secondary to eye trauma, right eye, indeterminate stage: Secondary | ICD-10-CM | POA: Diagnosis not present

## 2017-03-31 DIAGNOSIS — H4031X3 Glaucoma secondary to eye trauma, right eye, severe stage: Secondary | ICD-10-CM | POA: Diagnosis not present

## 2017-03-31 DIAGNOSIS — H40012 Open angle with borderline findings, low risk, left eye: Secondary | ICD-10-CM | POA: Diagnosis not present

## 2017-05-20 ENCOUNTER — Ambulatory Visit: Payer: PPO | Admitting: Nurse Practitioner

## 2017-06-08 ENCOUNTER — Other Ambulatory Visit: Payer: Self-pay | Admitting: Family Medicine

## 2017-08-04 ENCOUNTER — Other Ambulatory Visit: Payer: Self-pay | Admitting: Family Medicine

## 2017-08-04 DIAGNOSIS — R7303 Prediabetes: Secondary | ICD-10-CM

## 2017-08-04 DIAGNOSIS — I10 Essential (primary) hypertension: Secondary | ICD-10-CM

## 2017-08-04 DIAGNOSIS — E7800 Pure hypercholesterolemia, unspecified: Secondary | ICD-10-CM

## 2017-08-04 DIAGNOSIS — E538 Deficiency of other specified B group vitamins: Secondary | ICD-10-CM

## 2017-08-06 ENCOUNTER — Ambulatory Visit (INDEPENDENT_AMBULATORY_CARE_PROVIDER_SITE_OTHER): Payer: PPO

## 2017-08-06 VITALS — BP 118/74 | HR 59 | Temp 97.9°F | Ht 73.5 in | Wt 226.2 lb

## 2017-08-06 DIAGNOSIS — Z0000 Encounter for general adult medical examination without abnormal findings: Secondary | ICD-10-CM

## 2017-08-06 DIAGNOSIS — R7303 Prediabetes: Secondary | ICD-10-CM | POA: Diagnosis not present

## 2017-08-06 DIAGNOSIS — E538 Deficiency of other specified B group vitamins: Secondary | ICD-10-CM

## 2017-08-06 DIAGNOSIS — E7800 Pure hypercholesterolemia, unspecified: Secondary | ICD-10-CM | POA: Diagnosis not present

## 2017-08-06 DIAGNOSIS — I10 Essential (primary) hypertension: Secondary | ICD-10-CM | POA: Diagnosis not present

## 2017-08-06 LAB — LIPID PANEL
CHOLESTEROL: 79 mg/dL (ref 0–200)
HDL: 35.2 mg/dL — AB (ref >39.00)
LDL Cholesterol: 29 mg/dL (ref 0–99)
NonHDL: 43.3
TRIGLYCERIDES: 73 mg/dL (ref 0.0–149.0)
Total CHOL/HDL Ratio: 2
VLDL: 14.6 mg/dL (ref 0.0–40.0)

## 2017-08-06 LAB — COMPREHENSIVE METABOLIC PANEL
ALBUMIN: 4.2 g/dL (ref 3.5–5.2)
ALT: 15 U/L (ref 0–53)
AST: 15 U/L (ref 0–37)
Alkaline Phosphatase: 64 U/L (ref 39–117)
BUN: 11 mg/dL (ref 6–23)
CALCIUM: 9.3 mg/dL (ref 8.4–10.5)
CO2: 30 mEq/L (ref 19–32)
CREATININE: 0.84 mg/dL (ref 0.40–1.50)
Chloride: 102 mEq/L (ref 96–112)
GFR: 95.28 mL/min (ref >60.00)
Glucose, Bld: 108 mg/dL — ABNORMAL HIGH (ref 70–99)
POTASSIUM: 3.6 meq/L (ref 3.5–5.1)
SODIUM: 138 meq/L (ref 135–145)
TOTAL PROTEIN: 7.1 g/dL (ref 6.0–8.3)
Total Bilirubin: 1.5 mg/dL — ABNORMAL HIGH (ref 0.2–1.2)

## 2017-08-06 LAB — VITAMIN B12: Vitamin B-12: 121 pg/mL — ABNORMAL LOW (ref 211–911)

## 2017-08-06 LAB — HEMOGLOBIN A1C: HEMOGLOBIN A1C: 6.2 % (ref 4.6–6.5)

## 2017-08-06 LAB — MICROALBUMIN / CREATININE URINE RATIO
CREATININE, U: 67.2 mg/dL
Microalb Creat Ratio: 1 mg/g (ref 0.0–30.0)
Microalb, Ur: <0.7 mg/dL (ref 0.0–1.9)
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
## 2017-08-06 NOTE — Patient Instructions (Addendum)
Evan Morales , Thank you for taking time to come for your Medicare Wellness Visit. I appreciate your ongoing commitment to your health goals. Please review the following plan we discussed and let me know if I can assist you in the future.   These are the goals we discussed: Goals    . Increase physical activity     Starting 08/06/2017, I will continue to walk at least 30 minutes daily.        This is a list of the screening recommended for you and due dates:  Health Maintenance  Topic Date Due  . DTaP/Tdap/Td vaccine (1 - Tdap) 05/17/2019*  . Flu Shot  08/28/2017  . Tetanus Vaccine  05/17/2019  .  Hepatitis C: One time screening is recommended by Center for Disease Control  (CDC) for  adults born from [birth year] through 1966.   Completed  . Pneumonia vaccines  Completed  . Colon Cancer Screening  Discontinued  *Topic was postponed. The date shown is not the original due date.   Preventive Care for Adults  A healthy lifestyle and preventive care can promote health and wellness. Preventive health guidelines for adults include the following key practices.  . A routine yearly physical is a good way to check with your health care provider about your health and preventive screening. It is a chance to share any concerns and updates on your health and to receive a thorough exam.  . Visit your dentist for a routine exam and preventive care every 6 months. Brush your teeth twice a day and floss once a day. Good oral hygiene prevents tooth decay and gum disease.  . The frequency of eye exams is based on your age, health, family medical history, use  of contact lenses, and other factors. Follow your health care provider's recommendations for frequency of eye exams.  . Eat a healthy diet. Foods like vegetables, fruits, whole grains, low-fat dairy products, and lean protein foods contain the nutrients you need without too many calories. Decrease your intake of foods high in solid fats, added sugars, and  salt. Eat the right amount of calories for you. Get information about a proper diet from your health care provider, if necessary.  . Regular physical exercise is one of the most important things you can do for your health. Most adults should get at least 150 minutes of moderate-intensity exercise (any activity that increases your heart rate and causes you to sweat) each week. In addition, most adults need muscle-strengthening exercises on 2 or more days a week.  Silver Sneakers may be a benefit available to you. To determine eligibility, you may visit the website: www.silversneakers.com or contact program at 807 867 0273 Mon-Fri between 8AM-8PM.   . Maintain a healthy weight. The body mass index (BMI) is a screening tool to identify possible weight problems. It provides an estimate of body fat based on height and weight. Your health care provider can find your BMI and can help you achieve or maintain a healthy weight.   For adults 20 years and older: ? A BMI below 18.5 is considered underweight. ? A BMI of 18.5 to 24.9 is normal. ? A BMI of 25 to 29.9 is considered overweight. ? A BMI of 30 and above is considered obese.   . Maintain normal blood lipids and cholesterol levels by exercising and minimizing your intake of saturated fat. Eat a balanced diet with plenty of fruit and vegetables. Blood tests for lipids and cholesterol should begin at age 74 and  be repeated every 5 years. If your lipid or cholesterol levels are high, you are over 50, or you are at high risk for heart disease, you may need your cholesterol levels checked more frequently. Ongoing high lipid and cholesterol levels should be treated with medicines if diet and exercise are not working.  . If you smoke, find out from your health care provider how to quit. If you do not use tobacco, please do not start.  . If you choose to drink alcohol, please do not consume more than 2 drinks per day. One drink is considered to be 12 ounces  (355 mL) of beer, 5 ounces (148 mL) of wine, or 1.5 ounces (44 mL) of liquor.  . If you are 48-33 years old, ask your health care provider if you should take aspirin to prevent strokes.  . Use sunscreen. Apply sunscreen liberally and repeatedly throughout the day. You should seek shade when your shadow is shorter than you. Protect yourself by wearing long sleeves, pants, a wide-brimmed hat, and sunglasses year round, whenever you are outdoors.  . Once a month, do a whole body skin exam, using a mirror to look at the skin on your back. Tell your health care provider of new moles, moles that have irregular borders, moles that are larger than a pencil eraser, or moles that have changed in shape or color.

## 2017-08-06 NOTE — Progress Notes (Signed)
PCP notes:   Health maintenance:  Colon cancer screening - PCP please address at next appt (FYI: pt can no longer do colonoscopy and cannot remember to do FOBT; please determine if patient is candidate for Cologuard)  Abnormal screenings:   Hearing - failed  Hearing Screening   125Hz  250Hz  500Hz  1000Hz  2000Hz  3000Hz  4000Hz  6000Hz  8000Hz   Right ear:   40 40 40  0    Left ear:   40 40 40  0     Patient concerns:   None  Nurse concerns:  None  Next PCP appt:   08/13/17 @ 1030

## 2017-08-08 ENCOUNTER — Other Ambulatory Visit: Payer: Self-pay | Admitting: Family Medicine

## 2017-08-11 NOTE — Progress Notes (Signed)
I reviewed health advisor's note, was available for consultation, and agree with documentation and plan.  

## 2017-08-13 ENCOUNTER — Ambulatory Visit (INDEPENDENT_AMBULATORY_CARE_PROVIDER_SITE_OTHER): Payer: PPO | Admitting: Family Medicine

## 2017-08-13 ENCOUNTER — Encounter: Payer: PPO | Admitting: Family Medicine

## 2017-08-13 ENCOUNTER — Encounter: Payer: Self-pay | Admitting: Family Medicine

## 2017-08-13 VITALS — BP 118/66 | HR 64 | Temp 97.8°F | Ht 73.5 in | Wt 228.5 lb

## 2017-08-13 DIAGNOSIS — G459 Transient cerebral ischemic attack, unspecified: Secondary | ICD-10-CM | POA: Diagnosis not present

## 2017-08-13 DIAGNOSIS — Z0000 Encounter for general adult medical examination without abnormal findings: Secondary | ICD-10-CM

## 2017-08-13 DIAGNOSIS — E538 Deficiency of other specified B group vitamins: Secondary | ICD-10-CM | POA: Diagnosis not present

## 2017-08-13 DIAGNOSIS — I10 Essential (primary) hypertension: Secondary | ICD-10-CM

## 2017-08-13 DIAGNOSIS — Z7189 Other specified counseling: Secondary | ICD-10-CM | POA: Diagnosis not present

## 2017-08-13 DIAGNOSIS — E7800 Pure hypercholesterolemia, unspecified: Secondary | ICD-10-CM | POA: Diagnosis not present

## 2017-08-13 DIAGNOSIS — R7303 Prediabetes: Secondary | ICD-10-CM

## 2017-08-13 DIAGNOSIS — I6930 Unspecified sequelae of cerebral infarction: Secondary | ICD-10-CM | POA: Diagnosis not present

## 2017-08-13 DIAGNOSIS — D126 Benign neoplasm of colon, unspecified: Secondary | ICD-10-CM | POA: Diagnosis not present

## 2017-08-13 MED ORDER — CYANOCOBALAMIN 1000 MCG/ML IJ SOLN
1000.0000 ug | Freq: Once | INTRAMUSCULAR | Status: AC
  Administered 2017-08-13: 1000 ug via INTRAMUSCULAR

## 2017-08-13 MED ORDER — B-12 1000 MCG SL SUBL: 1.0000 | SUBLINGUAL_TABLET | Freq: Every day | SUBLINGUAL | Status: DC

## 2017-08-13 NOTE — Assessment & Plan Note (Signed)
Advanced planning - has at home. HCPOA is wifeand/or son. Does not want prolonged life support. If doesn't have capacity to make decisions would not want artificial prolongation of life. Wants to donate body to [facility].asked to bring us copy.  

## 2017-08-13 NOTE — Assessment & Plan Note (Signed)
Chronic, stable. Continue current regimen. 

## 2017-08-13 NOTE — Assessment & Plan Note (Addendum)
Discussed options - declines colonoscopy. Agrees to cologuard.

## 2017-08-13 NOTE — Assessment & Plan Note (Signed)
A1c stable. Reviewed with patient.

## 2017-08-13 NOTE — Progress Notes (Signed)
BP 118/66 (BP Location: Left Arm, Patient Position: Sitting, Cuff Size: Normal)   Pulse 64   Temp 97.8 F (36.6 C) (Oral)   Ht 6' 1.5" (1.867 m)   Wt 228 lb 8 oz (103.6 kg)   SpO2 95%   BMI 29.74 kg/m    CC: CPE Subjective:    Patient ID: Evan Morales, male    DOB: [birth year], 72 y.o.   MRN: [id number]  HPI: Evan Morales is a 72 y.o. male presenting on 08/13/2017 for Annual Exam (Pt 2. )   Saw Katha Cabal last week for medicare wellness visit. Note reviewed.   Hospitalized with ?TIA 09/2016. MRI brain without acute process. 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 Aspirin changed to plavix. Saw neurology in follow up but has not returned.   Preventative: Colonoscopy 04/2005 with adenomatous polyp. Rec rpt 5 yrs. Rpt colonoscopy deferred after CVA, has decided to proceed with iFOBs in interim (1 positive 03/2012, one negative 2017, did not return one in 2018). Discussed options, declines repeat colonoscopy, would like cologuard.  Prostate - always normal. Improved nocturia - doesn't drink fluids after 6pm. Desires to stop screening.  Flu yearly. Pneumovax 2013, prevnar 2017 Td 2011.  zostavax 06/2012.  shingrix - discussed, declines.  Advanced planning - has at home. HCPOA is wife and/or son. Does not want prolonged life support. If doesn't have capacity to make decisions would not want artificial prolongation of life. W[facility] to [facility] body to [facility]. asked to bring [place] copy.  Seat belt use discussed.  Sunscreen use discussed. No changing moles on skin.  Non smoker Alcohol - none Dentist - does not see Eye exam - sees Dr Edilia Bo regularly  Caffeine: 2-3 cups coffee/day  Lives with wife. (grown children)  Occupation: retired, was Occupational hygienist  Activity: walking daily at least 1/2 mi  Diet: good water, fruits/vegetables daily   Relevant past medical, surgical, family and social history reviewed and updated as indicated. Interim  medical history since our last visit reviewed. Allergies and medications reviewed and updated. Outpatient Medications Prior to Visit  Medication Sig Dispense Refill  . atorvastatin (LIPITOR) 40 MG tablet Take 1 tablet (40 mg total) by mouth daily at 6 PM. 90 tablet 3  . bisacodyl (BISACODYL) 5 MG EC tablet Take 1 tablet (5 mg total) by mouth daily as needed for moderate constipation.    . clopidogrel (PLAVIX) 75 MG tablet Take 1 tablet (75 mg total) by mouth daily. 90 tablet 3  . co-enzyme Q-10 50 MG capsule Take 4 capsules (200 mg total) by mouth daily. (Patient taking differently: Take 200 mg by mouth once a week. ) 60 capsule 1  . dorzolamide-timolol (COSOPT) 22.3-6.8 MG/ML ophthalmic solution Place 1 drop into the right eye 2 (two) times daily.    . hydrochlorothiazide (MICROZIDE) 12.5 MG capsule TAKE ONE CAPSULE BY MOUTH EVERY DAY 90 capsule 0  . polyethylene glycol powder (GLYCOLAX/MIRALAX) powder Take 1/2 to a whole capful mixed in 8 oz fluid as needed for constipation, hold for diarrhea. (Patient taking differently: Take 0.5 g by mouth daily as needed for mild constipation. )    . quinapril (ACCUPRIL) 20 MG tablet TAKE 1 TABLET BY MOUTH EVERY DAY 150 tablet 0   No facility-administered medications prior to visit.      Per HPI unless specifically indicated in ROS section below Review of Systems  Constitutional: Negative for activity change, appetite change, chills, fatigue, fever and unexpected  weight change.  HENT: Negative for hearing loss.   Eyes: Negative for visual disturbance.  Respiratory: Negative for cough, chest tightness, shortness of breath and wheezing.   Cardiovascular: Negative for chest pain, palpitations and leg swelling.  Gastrointestinal: Negative for abdominal distention, abdominal pain, blood in stool, constipation, diarrhea, nausea and vomiting.  Genitourinary: Negative for difficulty urinating and hematuria.  Musculoskeletal: Negative for arthralgias, myalgias  and neck pain.  Skin: Negative for rash.  Neurological: Negative for dizziness, seizures, syncope and headaches.  Hematological: Negative for adenopathy. Does not bruise/bleed easily.  Psychiatric/Behavioral: Negative for dysphoric mood. The patient is not nervous/anxious.        Objective:    BP 118/66 (BP Location: Left Arm, Patient Position: Sitting, Cuff Size: Normal)   Pulse 64   Temp 97.8 F (36.6 C) (Oral)   Ht 6' 1.5" (1.867 m)   Wt 228 lb 8 oz (103.6 kg)   SpO2 95%   BMI 29.74 kg/m   Wt Readings from Last 3 Encounters:  08/13/17 228 lb 8 oz (103.6 kg)  08/06/17 226 lb 4 oz (102.6 kg)  11/19/16 229 lb 12.8 oz (104.2 kg)    Physical Exam  Constitutional: He is oriented to person, place, and time. He appears well-developed and well-nourished. No distress.  HENT:  Head: Normocephalic and atraumatic.  Right Ear: Hearing, tympanic membrane, external ear and ear canal normal.  Left Ear: Hearing, tympanic membrane, external ear and ear canal normal.  Nose: Nose normal.  Mouth/Throat: Uvula is midline, oropharynx is clear and moist and mucous membranes are normal. No oropharyngeal exudate, posterior oropharyngeal edema or posterior oropharyngeal erythema.  Eyes: Pupils are equal, round, and reactive to light. Conjunctivae and EOM are normal. No scleral icterus.  Chronic R>L vision loss  Neck: Normal range of motion. Neck supple. Carotid bruit is not present. No thyromegaly present.  Cardiovascular: Normal rate, regular rhythm, normal heart sounds and intact distal pulses.  No murmur heard. Pulses:      Radial pulses are 2+ on the right side, and 2+ on the left side.  Pulmonary/Chest: Effort normal and breath sounds normal. No respiratory distress. He has no wheezes. He has no rales.  Abdominal: Soft. Bowel sounds are normal. He exhibits no distension and no mass. There is no tenderness. There is no rebound and no guarding.  Musculoskeletal: Normal range of motion. He exhibits  no edema.  Lymphadenopathy:    He has no cervical adenopathy.  Neurological: He is alert and oriented to person, place, and time.  CN grossly intact, station and gait intact  Skin: Skin is warm and dry. No rash noted.  Psychiatric: He has a normal mood and affect. His behavior is normal. Judgment and thought content normal.  Nursing note and vitals reviewed.  Results for orders placed or performed in visit on 08/06/17  Microalbumin / creatinine urine ratio  Result Value Ref Range   Microalb, Ur <0.7 0.0 - 1.9 mg/dL   Creatinine,U 67.2 mg/dL   Microalb Creat Ratio 1.0 0.0 - 30.0 mg/g  Vitamin B12  Result Value Ref Range   Vitamin B-12 121 (L) 211 - 911 pg/mL  Hemoglobin A1c  Result Value Ref Range   Hgb A1c MFr Bld 6.2 4.6 - 6.5 %  Lipid panel  Result Value Ref Range   Cholesterol 79 0 - 200 mg/dL   Triglycerides 73.0 0.0 - 149.0 mg/dL   HDL 35.20 (L) >39.00 mg/dL   VLDL 14.6 0.0 - 40.0 mg/dL   LDL  Cholesterol 29 0 - 99 mg/dL   Total CHOL/HDL Ratio 2    NonHDL 43.30   Comprehensive metabolic panel  Result Value Ref Range   Sodium 138 135 - 145 mEq/L   Potassium 3.6 3.5 - 5.1 mEq/L   Chloride 102 96 - 112 mEq/L   CO2 30 19 - 32 mEq/L   Glucose, Bld 108 (H) 70 - 99 mg/dL   BUN 11 6 - 23 mg/dL   Creatinine, Ser 0.84 0.40 - 1.50 mg/dL   Total Bilirubin 1.5 (H) 0.2 - 1.2 mg/dL   Alkaline Phosphatase 64 39 - 117 U/L   AST 15 0 - 37 U/L   ALT 15 0 - 53 U/L   Total Protein 7.1 6.0 - 8.3 g/dL   Albumin 4.2 3.5 - 5.2 g/dL   Calcium 9.3 8.4 - 10.5 mg/dL   GFR 95.28 >60.00 mL/min   Lab Results  Component Value Date   PSA 2.46 07/12/2014   PSA 3.14 07/08/2013   PSA 2.16 01/09/2012       Assessment & Plan:   Problem List Items Addressed This Visit    Vitamin B12 deficiency    Low, worse. Has not been taking B12. rec start sublingual B12. Start with shot today. RTC 6 mo f/u visit rpt B12 levels at that time.      Relevant Medications   cyanocobalamin ((VITAMIN B-12))  injection 1,000 mcg (Completed) (Start on 08/13/2017 11:15 AM)   TIA (transient ischemic attack)    Discussed hospitalization - pt states symptoms were also related to anxiety.       Prediabetes    A1c stable. Reviewed with patient.       HYPERCHOLESTEROLEMIA    Chronic, stable continue lipitor. HDL remains low. LDL at goal.       History of cerebrovascular accident (CVA) with residual deficit    Continue plavix and statin. rec f/u with neurology as seems he's overdue.       Health maintenance examination - Primary    Preventative protocols reviewed and updated unless pt declined. Discussed healthy diet and lifestyle.       Essential hypertension    Chronic, stable. Continue current regimen.       COLONIC POLYPS, ADENOMATOUS    Discussed options - declines colonoscopy. Agrees to cologuard.       Advanced care planning/counseling discussion    Advanced planning - has at home. HCPOA is wife and/or son. Does not want prolonged life support. If doesn't have capacity to make decisions would not want artificial prolongation of life. Wants to donate body to [facility]. asked to bring [place] copy.           Meds ordered this encounter  Medications  . cyanocobalamin ((VITAMIN B-12)) injection 1,000 mcg  . Cyanocobalamin (B-12) 1000 MCG SUBL    Sig: Place 1 tablet under the tongue daily.    Dispense:  30 each   No orders of the defined types were placed in this encounter.   Follow up plan: Return in about 6 months (around 02/13/2018) for follow up visit.  Ria Bush, MD

## 2017-08-13 NOTE — Patient Instructions (Addendum)
We will sign you up for cologuard stool test.  Talk with wife about new shingles shot (shingrix). If interested, check with pharmacy about new 2 shot shingles series (shingrix).  Bring [place] a copy of your advanced directive.  B12 was very low! B12 shot today. Start 1068mcg b12 supplement daily, sublingual dissolvable tablets if you can find. Return in 6 months for follow up visit and we will recheck these levels at that time. You are doing well today. You may be due for follow up with neurology.  Health Maintenance, Male A healthy lifestyle and preventive care is important for your health and wellness. Ask your health care provider about what schedule of regular examinations is right for you. What should I know about weight and diet? Eat a Healthy Diet  Eat plenty of vegetables, fruits, whole grains, low-fat dairy products, and lean protein.  Do not eat a lot of foods high in solid fats, added sugars, or salt.  Maintain a Healthy Weight Regular exercise can help you achieve or maintain a healthy weight. You should:  Do at least 150 minutes of exercise each week. The exercise should increase your heart rate and make you sweat (moderate-intensity exercise).  Do strength-training exercises at least twice a week.  Watch Your Levels of Cholesterol and Blood Lipids  Have your blood tested for lipids and cholesterol every 5 years starting at 73 years of age. If you are at high risk for heart disease, you should start having your blood tested when you are 73 years old. You may need to have your cholesterol levels checked more often if: ? Your lipid or cholesterol levels are high. ? You are older than 73 years of age. ? You are at high risk for heart disease.  What should I know about cancer screening? Many types of cancers can be detected early and may often be prevented. Lung Cancer  You should be screened every year for lung cancer if: ? You are a current smoker who has smoked for at least  30 years. ? You are a former smoker who has quit within the past 15 years.  Talk to your health care provider about your screening options, when you should start screening, and how often you should be screened.  Colorectal Cancer  Routine colorectal cancer screening usually begins at 74 years of age and should be repeated every 5-10 years until you are 73 years old. You may need to be screened more often if early forms of precancerous polyps or small growths are found. Your health care provider may recommend screening at an earlier age if you have risk factors for colon cancer.  Your health care provider may recommend using home test kits to check for hidden blood in the stool.  A small camera at the end of a tube can be used to examine your colon (sigmoidoscopy or colonoscopy). This checks for the earliest forms of colorectal cancer.  Prostate and Testicular Cancer  Depending on your age and overall health, your health care provider may do certain tests to screen for prostate and testicular cancer.  Talk to your health care provider about any symptoms or concerns you have about testicular or prostate cancer.  Skin Cancer  Check your skin from head to toe regularly.  Tell your health care provider about any new moles or changes in moles, especially if: ? There is a change in a mole's size, shape, or color. ? You have a mole that is larger than a pencil eraser.  Always use sunscreen. Apply sunscreen liberally and repeat throughout the day.  Protect yourself by wearing long sleeves, pants, a wide-brimmed hat, and sunglasses when outside.  What should I know about heart disease, diabetes, and high blood pressure?  If you are 2-69 years of age, have your blood pressure checked every 3-5 years. If you are 48 years of age or older, have your blood pressure checked every year. You should have your blood pressure measured twice-once when you are at a hospital or clinic, and once when you  are not at a hospital or clinic. Record the average of the two measurements. To check your blood pressure when you are not at a hospital or clinic, you can use: ? An automated blood pressure machine at a pharmacy. ? A home blood pressure monitor.  Talk to your health care provider about your target blood pressure.  If you are between 47-49 years old, ask your health care provider if you should take aspirin to prevent heart disease.  Have regular diabetes screenings by checking your fasting blood sugar level. ? If you are at a normal weight and have a low risk for diabetes, have this test once every three years after the age of 20. ? If you are overweight and have a high risk for diabetes, consider being tested at a younger age or more often.  A one-time screening for abdominal aortic aneurysm (AAA) by ultrasound is recommended for men aged 16-75 years who are current or former smokers. What should I know about preventing infection? Hepatitis B If you have a higher risk for hepatitis B, you should be screened for this virus. Talk with your health care provider to find out if you are at risk for hepatitis B infection. Hepatitis C Blood testing is recommended for:  Everyone born from [birth year] through 1966.  Anyone with known risk factors for hepatitis C.  Sexually Transmitted Diseases (STDs)  You should be screened each year for STDs including gonorrhea and chlamydia if: ? You are sexually active and are younger than 73 years of age. ? You are older than 73 years of age and your health care provider tells you that you are at risk for this type of infection. ? Your sexual activity has changed since you were last screened and you are at an increased risk for chlamydia or gonorrhea. Ask your health care provider if you are at risk.  Talk with your health care provider about whether you are at high risk of being infected with HIV. Your health care provider may recommend a prescription medicine to  help prevent HIV infection.  What else can I do?  Schedule regular health, dental, and eye exams.  Stay current with your vaccines (immunizations).  Do not use any tobacco products, such as cigarettes, chewing tobacco, and e-cigarettes. If you need help quitting, ask your health care provider.  Limit alcohol intake to no more than 2 drinks per day. One drink equals 12 ounces of beer, 5 ounces of wine, or 1 ounces of hard liquor.  Do not use street drugs.  Do not share needles.  Ask your health care provider for help if you need support or information about quitting drugs.  Tell your health care provider if you often feel depressed.  Tell your health care provider if you have ever been abused or do not feel safe at home. This information is not intended to replace advice given to you by your health care provider. Make sure you discuss any  questions you have with your health care provider. Document Released: 07/13/2007 Document Revised: 09/13/2015 Document Reviewed: 10/18/2014 Elsevier Interactive Patient Education  [employer].

## 2017-08-13 NOTE — Assessment & Plan Note (Signed)
Low, worse. Has not been taking B12. rec start sublingual B12. Start with shot today. RTC 6 mo f/u visit rpt B12 levels at that time.

## 2017-08-13 NOTE — Assessment & Plan Note (Signed)
Discussed hospitalization - pt states symptoms were also related to anxiety.

## 2017-08-13 NOTE — Assessment & Plan Note (Signed)
Preventative protocols reviewed and updated unless pt declined. Discussed healthy diet and lifestyle.  

## 2017-08-13 NOTE — Assessment & Plan Note (Signed)
Chronic, stable continue lipitor. HDL remains low. LDL at goal.

## 2017-08-13 NOTE — Assessment & Plan Note (Signed)
Continue plavix and statin. rec f/u with neurology as seems he's overdue.

## 2017-09-08 ENCOUNTER — Other Ambulatory Visit: Payer: Self-pay | Admitting: Family Medicine

## 2017-09-11 ENCOUNTER — Other Ambulatory Visit: Payer: Self-pay | Admitting: Family Medicine

## 2017-10-10 ENCOUNTER — Encounter (INDEPENDENT_AMBULATORY_CARE_PROVIDER_SITE_OTHER): Payer: Self-pay | Admitting: Family Medicine

## 2017-10-10 ENCOUNTER — Ambulatory Visit (INDEPENDENT_AMBULATORY_CARE_PROVIDER_SITE_OTHER): Payer: PPO | Admitting: Physician Assistant

## 2017-10-10 ENCOUNTER — Ambulatory Visit (INDEPENDENT_AMBULATORY_CARE_PROVIDER_SITE_OTHER): Payer: PPO | Admitting: Family Medicine

## 2017-10-10 ENCOUNTER — Ambulatory Visit (INDEPENDENT_AMBULATORY_CARE_PROVIDER_SITE_OTHER): Payer: PPO

## 2017-10-10 DIAGNOSIS — M25562 Pain in left knee: Secondary | ICD-10-CM | POA: Diagnosis not present

## 2017-10-10 NOTE — Progress Notes (Signed)
Office Visit Note   Patient: Evan Morales           Date of Birth: 1945-03-21           MRN: [id number] Visit Date: 10/10/2017 Requested by: Ria Bush, MD [place], [place] [id number] PCP: Ria Bush, MD  Subjective: Chief Complaint  Patient presents with  . Left Knee - Pain    Fell yesterday - tripped over something sticking up from the sidewalk.  Hit right knee on concrete (abrasion) and twisted the left knee.    HPI: He is a 72 year old with left knee pain.  Yesterday he tripped and fell forward hanging on both of his knees.  He required help getting up, but has been able to bear weight.  His right knee does not hurt but the left knee hurts with ambulation.  No pain at rest.  He is status post right knee replacement and has done well from that standpoint.  He is concerned because he is scheduled to go fishing next month and would like to have pain relief from the trip.                Objective: Vital Signs: There were no vitals taken for this visit.  Physical Exam:  Left knee: There is a small bruise on the anterior medial aspect near the femoral condyle.  Trace effusion, no warmth or erythema.  No abrasion on the left knee.  Extensor mechanism is intact with active extension.  He has varus deformity of his knee.  Slight tenderness on the medial joint line, no palpable click with McMurray's.  Imaging: 2 view x-rays left knee:   There is bone-on-bone medial compartment DJD with moderate lateral and patellofemoral DJD as well.  No sign of fracture.  The right knee on the AP view looks good, intact prosthesis with no sign of loosening.  Assessment & Plan: 1.  1 day status post fall with left knee contusion and underlying medial compartment DJD -Cautious activities for the next couple weeks.  If he is still having pain prior to his trip next month, we can inject with cortisone. -Because of his underlying medical problems, he is a poor surgical  candidate.  In the future, he might benefit from medial compartment unloading brace.   Follow-Up Instructions: No follow-ups on file.       Procedures: None today.   PMFS History: Patient Active Problem List   Diagnosis Date Noted  . TIA (transient ischemic attack) 09/30/2016  . 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 Chronic pain of both shoulders 01/30/2016  . Vitamin B12 deficiency 07/27/2015  . Overweight with body mass index (BMI) 25.0-29.9 07/27/2015  . 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 Advanced care planning/counseling discussion 07/26/2014  . Health maintenance examination 07/26/2014  . Dysthymia 07/26/2014  . Status post corneal transplant 08/17/2012  . Medicare annual wellness visit, subsequent 07/13/2012  . Constipation 04/09/2012  . Vision loss, bilateral 04/06/2012  . Bilateral carotid artery stenosis 04/06/2012  . History of cerebrovascular accident (CVA) with residual deficit 03/27/2012  . Prediabetes 07/05/2010  . HYPERCHOLESTEROLEMIA 05/04/2009  . ECZEMA 05/05/2007  . Essential hypertension 05/13/2006  . COLONIC POLYPS, ADENOMATOUS 05/15/2005   Past Medical History:  Diagnosis Date  . 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  Marland Kitchen  CVA (cerebral vascular accident) ([place]) 03/27/2012   left occipital lobe - acute or subacute infarction. No hemorrhage. Residual numbness R index and middle finger and R periorally, residual memory loss, worsened L vision  . Degenerative arthritis   . Dyslipidemia   . Dysthymia   . History of colon polyps 05/15/2005   tubular adenomas polyps  . Hypertension   . Legally blind   . Pre-diabetes     Family History  Problem Relation Age of Onset  . Cancer Mother        male  . Alzheimer's disease Mother   . Glaucoma Father   . Coronary artery disease Neg  Hx   . Stroke Neg Hx   . Diabetes Neg Hx   . Colon cancer Neg Hx     Past Surgical History:  Procedure Laterality Date  . CATARACT EXTRACTION  2013   left  . 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 ESOPHAGOGASTRODUODENOSCOPY  05/24/2005   mild erythema in antrum  . 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 Occupation: Development worker, international aid: [employer]  Tobacco Use  . Smoking status: Never Smoker  . Smokeless tobacco: Former Systems developer    Types: Chew  Substance and Sexual Activity  . Alcohol use: No    Alcohol/week: 0.0 standard drinks  . Drug use: No  . Sexual activity: Not Currently

## 2017-10-14 ENCOUNTER — Telehealth: Payer: Self-pay | Admitting: Family Medicine

## 2017-10-14 NOTE — Telephone Encounter (Signed)
Received notice from cologuard that patient has not submitted stool kit yet. Would touch base with patient and encourage he complete, or has he decided not to proceed with cologuard testing at this time? Overdue for colon cancer screening.

## 2017-10-16 NOTE — Telephone Encounter (Signed)
Spoke with pt about iFOB kit. Says he decided the iFOB kit will be easier for him to do than Cologuard. Says he is sending Cologuard back. Notified him I will mail iFOB kit and instructions to him. Pt verbalizes understanding. Fyi to Dr. Darnell Level.

## 2017-10-16 NOTE — Telephone Encounter (Signed)
Spoke with pt asking about Cologuard testing.  Says he will try to get it done. Fyi to Dr. Darnell Level.

## 2017-10-16 NOTE — Telephone Encounter (Signed)
Pt came in to let us know he doesn't want to do the cologuard test. He'd rather use the IFOB. Please advise.

## 2017-11-02 ENCOUNTER — Other Ambulatory Visit: Payer: Self-pay | Admitting: Neurology

## 2017-11-03 ENCOUNTER — Other Ambulatory Visit: Payer: Self-pay | Admitting: Family Medicine

## 2017-11-05 NOTE — Telephone Encounter (Signed)
plavix refilled for patient.

## 2017-11-05 NOTE — Telephone Encounter (Signed)
Received neurology request to manage plavix as patient has not returned to see neurology.

## 2017-11-05 NOTE — Telephone Encounter (Signed)
Thank you  Dr. Leo Grosser. Have a good day.

## 2017-11-06 ENCOUNTER — Encounter (INDEPENDENT_AMBULATORY_CARE_PROVIDER_SITE_OTHER): Payer: Self-pay | Admitting: Family Medicine

## 2017-11-06 ENCOUNTER — Ambulatory Visit (INDEPENDENT_AMBULATORY_CARE_PROVIDER_SITE_OTHER): Payer: PPO | Admitting: Family Medicine

## 2017-11-06 DIAGNOSIS — M25562 Pain in left knee: Secondary | ICD-10-CM | POA: Diagnosis not present

## 2017-11-06 NOTE — Progress Notes (Signed)
Office Visit Note   Patient: Evan Morales           Date of Birth: [birth year]           MRN: [id number] Visit Date: 11/06/2017 Requested by: Ria Bush, MD [place], [place] [address] PCP: Ria Bush, MD  Subjective: Chief Complaint  Patient presents with  . Left Knee - Pain, Follow-up    Feeling a bit better today. ? Cortisone injection    HPI: He is here for follow-up left knee pain.  He actually scheduled this appointment for an injection, but today he is feeling dramatically better than he has in the past few weeks.  He leaves for the beach to go fishing next week.              ROS: Noncontributory  Objective: Vital Signs: There were no vitals taken for this visit.  Physical Exam:  Left knee: No effusion today, no warmth or erythema.  Very slight flexion contracture, no significant joint line tenderness.  Imaging: None today.  Assessment & Plan: 1.  Improved left knee pain with underlying DJD -We elected not to inject today.  If he returns from his trip and is in a lot of pain, we will do an injection when he comes back.   Follow-Up Instructions: No follow-ups on file.       Procedures: None today.   PMFS History: Patient Active Problem List   Diagnosis Date Noted  . TIA (transient ischemic attack) 09/30/2016  . 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 Chronic pain of both shoulders 01/30/2016  . Vitamin B12 deficiency 07/27/2015  . Overweight with body mass index (BMI) 25.0-29.9 07/27/2015  . 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 Advanced care planning/counseling discussion 07/26/2014  . Health maintenance examination 07/26/2014  . Dysthymia 07/26/2014  . Status post corneal transplant 08/17/2012  . Medicare annual wellness visit, subsequent 07/13/2012  .  Constipation 04/09/2012  . Vision loss, bilateral 04/06/2012  . Bilateral carotid artery stenosis 04/06/2012  . History of cerebrovascular accident (CVA) with residual deficit 03/27/2012  . Prediabetes 07/05/2010  . HYPERCHOLESTEROLEMIA 05/04/2009  . ECZEMA 05/05/2007  . Essential hypertension 05/13/2006  . COLONIC POLYPS, ADENOMATOUS 05/15/2005   Past Medical History:  Diagnosis Date  . 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  . CVA (cerebral vascular accident) ([place]) 03/27/2012   left occipital lobe - acute or subacute infarction. No hemorrhage. Residual numbness R index and middle finger and R periorally, residual memory loss, worsened L vision  . Degenerative arthritis   . Dyslipidemia   . Dysthymia   . History of colon polyps 05/15/2005   tubular adenomas polyps  . Hypertension   . Legally blind   . Pre-diabetes     Family History  Problem Relation Age of Onset  . Cancer Mother        male  . Alzheimer's disease Mother   . Glaucoma Father   . Coronary artery disease Neg Hx   . Stroke Neg Hx   . Diabetes Neg Hx   . Colon cancer Neg Hx     Past Surgical History:  Procedure Laterality Date  . CATARACT EXTRACTION  2013   left  . 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 ESOPHAGOGASTRODUODENOSCOPY  05/24/2005  mild erythema in antrum  . 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 Occupation: Development worker, international aid: [employer]  Tobacco Use  . Smoking status: Never Smoker  . Smokeless tobacco: Former Systems developer    Types: Chew  Substance and Sexual Activity  . Alcohol use: No    Alcohol/week: 0.0 standard drinks  . Drug use: No  . Sexual activity: Not Currently

## 2017-11-21 ENCOUNTER — Other Ambulatory Visit: Payer: Self-pay | Admitting: Neurology

## 2018-01-12 ENCOUNTER — Other Ambulatory Visit: Payer: Self-pay | Admitting: Family Medicine

## 2018-03-03 ENCOUNTER — Other Ambulatory Visit: Payer: Self-pay | Admitting: Family Medicine

## 2018-04-06 DIAGNOSIS — H52202 Unspecified astigmatism, left eye: Secondary | ICD-10-CM | POA: Diagnosis not present

## 2018-04-06 DIAGNOSIS — H524 Presbyopia: Secondary | ICD-10-CM | POA: Diagnosis not present

## 2018-04-06 DIAGNOSIS — H4031X3 Glaucoma secondary to eye trauma, right eye, severe stage: Secondary | ICD-10-CM | POA: Diagnosis not present

## 2018-04-06 DIAGNOSIS — H40012 Open angle with borderline findings, low risk, left eye: Secondary | ICD-10-CM | POA: Diagnosis not present

## 2018-04-06 DIAGNOSIS — H5212 Myopia, left eye: Secondary | ICD-10-CM | POA: Diagnosis not present

## 2018-04-29 ENCOUNTER — Other Ambulatory Visit: Payer: Self-pay | Admitting: Family Medicine

## 2018-04-29 NOTE — Telephone Encounter (Signed)
Please call patient and schedule Webex. Patient was to follow-up in 6 months (see office notes 08/13/17). After appointment is scheduled send back to [facility] for refill.

## 2018-05-01 NOTE — Telephone Encounter (Signed)
Pt does not have web capability do you want phone visit for med refill

## 2018-05-01 NOTE — Telephone Encounter (Signed)
Tried calling pt phone disconnected

## 2018-05-04 ENCOUNTER — Other Ambulatory Visit: Payer: Self-pay | Admitting: Family Medicine

## 2018-05-04 MED ORDER — HYDROCHLOROTHIAZIDE 12.5 MG PO CAPS: 12.5000 mg | ORAL_CAPSULE | Freq: Every day | ORAL | 1 refills | Status: DC

## 2018-05-04 NOTE — Addendum Note (Signed)
Addended by: Ria Bush on: 05/04/2018 01:24 PM   Modules accepted: Orders

## 2018-05-04 NOTE — Telephone Encounter (Signed)
Spoke with pt he declined phone vist

## 2018-05-04 NOTE — Telephone Encounter (Signed)
Yes would offer phone visit

## 2018-05-05 NOTE — Telephone Encounter (Signed)
Electronic refill request Plavix Last office visit 08/13/17 Last refill 11/05/17 #90/1

## 2018-06-16 ENCOUNTER — Ambulatory Visit (INDEPENDENT_AMBULATORY_CARE_PROVIDER_SITE_OTHER): Payer: PPO | Admitting: Family Medicine

## 2018-06-16 ENCOUNTER — Other Ambulatory Visit: Payer: Self-pay

## 2018-06-16 ENCOUNTER — Encounter: Payer: Self-pay | Admitting: Family Medicine

## 2018-06-16 DIAGNOSIS — M1712 Unilateral primary osteoarthritis, left knee: Secondary | ICD-10-CM | POA: Diagnosis not present

## 2018-06-16 MED ORDER — DICLOFENAC SODIUM 1 % TD GEL: 4.0000 g | Freq: Four times a day (QID) | TRANSDERMAL | 6 refills | Status: DC | PRN

## 2018-06-16 NOTE — Progress Notes (Signed)
Office Visit Note   Patient: Evan Morales           Date of Birth: Nov 09, 1945           MRN: [id number] Visit Date: 06/16/2018 Requested by: Ria Bush, MD [place], [place] [address] PCP: Ria Bush, MD  Subjective: Chief Complaint  Patient presents with  . Left Knee - Pain    Persistent knee pain. Has increased since last visit (10/2017)    HPI: He is here for follow-up chronic left knee pain due to bone-on-bone medial compartment osteoarthritis.  He has done pretty well since last visit.  Pain is manageable.  It bothers him when he first starts walking and he wonders if there is something topical he can try.  He is also interested in learning some exercises to strengthen his leg.  In October he has a fishing trip planned and wants to have an injection prior to the trip.               ROS: No fever or chills.  All other systems were reviewed and are negative.  Objective: Vital Signs: There were no vitals taken for this visit.  Physical Exam:  General:  Alert and oriented, in no acute distress. Pulm:  Breathing unlabored. Psy:  Normal mood, congruent affect. Skin: No rash on his skin. Left knee: Trace effusion, no warmth or erythema.  Tender on the medial joint line.  Slight laxity with valgus stress but solid endpoint.  Tender along the medial joint line.  Imaging: None today.  Assessment & Plan: 1.  Chronic left knee medial compartment osteoarthritis -Trial of Voltaren gel.  Home exercises given and physical therapy referral to [facility].  Return about a week prior to his trip if needed for injection.     Procedures: No procedures performed  No notes on file     PMFS History: Patient Active Problem List   Diagnosis Date Noted  . TIA (transient ischemic attack) 09/30/2016  . 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 Chronic pain of both shoulders 01/30/2016  . Vitamin B12 deficiency 07/27/2015   . Overweight with body mass index (BMI) 25.0-29.9 07/27/2015  . 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 Advanced care planning/counseling discussion 07/26/2014  . Health maintenance examination 07/26/2014  . Dysthymia 07/26/2014  . Status post corneal transplant 08/17/2012  . Medicare annual wellness visit, subsequent 07/13/2012  . Constipation 04/09/2012  . Vision loss, bilateral 04/06/2012  . Bilateral carotid artery stenosis 04/06/2012  . History of cerebrovascular accident (CVA) with residual deficit 03/27/2012  . Prediabetes 07/05/2010  . HYPERCHOLESTEROLEMIA 05/04/2009  . ECZEMA 05/05/2007  . Essential hypertension 05/13/2006  . COLONIC POLYPS, ADENOMATOUS 05/15/2005   Past Medical History:  Diagnosis Date  . 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  . CVA (cerebral vascular accident) ([place]) 03/27/2012   left occipital lobe - acute or subacute infarction. No hemorrhage. Residual numbness R index and middle finger and R periorally, residual memory loss, worsened L vision  . Degenerative arthritis   . Dyslipidemia   . Dysthymia   . History of colon polyps 05/15/2005   tubular adenomas polyps  . Hypertension   . Legally blind   . Pre-diabetes     Family History  Problem Relation Age of Onset  .  Cancer Mother        male  . Alzheimer's disease Mother   . Glaucoma Father   . Coronary artery disease Neg Hx   . Stroke Neg Hx   . Diabetes Neg Hx   . Colon cancer Neg Hx     Past Surgical History:  Procedure Laterality Date  . CATARACT EXTRACTION  2013   left  . 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 ESOPHAGOGASTRODUODENOSCOPY  05/24/2005   mild erythema in antrum  . 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 Occupation: Development worker, international aid: [employer]   Tobacco Use  . Smoking status: Never Smoker  . Smokeless tobacco: Former Systems developer    Types: Chew  Substance and Sexual Activity  . Alcohol use: No    Alcohol/week: 0.0 standard drinks  . Drug use: No  . Sexual activity: Not Currently

## 2018-07-11 ENCOUNTER — Other Ambulatory Visit: Payer: Self-pay | Admitting: Family Medicine

## 2018-08-21 ENCOUNTER — Ambulatory Visit: Payer: PPO

## 2018-08-21 ENCOUNTER — Other Ambulatory Visit: Payer: Self-pay | Admitting: Family Medicine

## 2018-08-21 ENCOUNTER — Other Ambulatory Visit (INDEPENDENT_AMBULATORY_CARE_PROVIDER_SITE_OTHER): Payer: PPO

## 2018-08-21 ENCOUNTER — Other Ambulatory Visit: Payer: Self-pay

## 2018-08-21 DIAGNOSIS — E538 Deficiency of other specified B group vitamins: Secondary | ICD-10-CM | POA: Diagnosis not present

## 2018-08-21 DIAGNOSIS — E7800 Pure hypercholesterolemia, unspecified: Secondary | ICD-10-CM | POA: Diagnosis not present

## 2018-08-21 DIAGNOSIS — R7303 Prediabetes: Secondary | ICD-10-CM

## 2018-08-21 DIAGNOSIS — I10 Essential (primary) hypertension: Secondary | ICD-10-CM

## 2018-08-21 LAB — COMPREHENSIVE METABOLIC PANEL
ALT: 18 U/L (ref 0–53)
AST: 15 U/L (ref 0–37)
Albumin: 4.6 g/dL (ref 3.5–5.2)
Alkaline Phosphatase: 67 U/L (ref 39–117)
BUN: 8 mg/dL (ref 6–23)
CO2: 30 mEq/L (ref 19–32)
Calcium: 9.8 mg/dL (ref 8.4–10.5)
Chloride: 102 mEq/L (ref 96–112)
Creatinine, Ser: 0.83 mg/dL (ref 0.40–1.50)
GFR: 90.64 mL/min (ref >60.00)
Glucose, Bld: 96 mg/dL (ref 70–99)
Potassium: 4.3 mEq/L (ref 3.5–5.1)
Sodium: 140 mEq/L (ref 135–145)
Total Bilirubin: 1.8 mg/dL — ABNORMAL HIGH (ref 0.2–1.2)
Total Protein: 7 g/dL (ref 6.0–8.3)

## 2018-08-21 LAB — LIPID PANEL
Cholesterol: 82 mg/dL (ref 0–200)
HDL: 40.7 mg/dL (ref >39.00)
LDL Cholesterol: 28 mg/dL (ref 0–99)
NonHDL: 41.22
Total CHOL/HDL Ratio: 2
Triglycerides: 68 mg/dL (ref 0.0–149.0)
VLDL: 13.6 mg/dL (ref 0.0–40.0)

## 2018-08-21 LAB — HEMOGLOBIN A1C: Hgb A1c MFr Bld: 6 % (ref 4.6–6.5)

## 2018-08-21 LAB — VITAMIN B12: Vitamin B-12: 152 pg/mL — ABNORMAL LOW (ref 211–911)

## 2018-08-25 ENCOUNTER — Encounter: Payer: Self-pay | Admitting: Family Medicine

## 2018-08-25 ENCOUNTER — Ambulatory Visit (INDEPENDENT_AMBULATORY_CARE_PROVIDER_SITE_OTHER): Payer: PPO | Admitting: Family Medicine

## 2018-08-25 ENCOUNTER — Other Ambulatory Visit: Payer: Self-pay

## 2018-08-25 VITALS — BP 112/76 | HR 63 | Temp 98.1°F | Ht 72.0 in | Wt 219.6 lb

## 2018-08-25 DIAGNOSIS — I6523 Occlusion and stenosis of bilateral carotid arteries: Secondary | ICD-10-CM | POA: Diagnosis not present

## 2018-08-25 DIAGNOSIS — I6930 Unspecified sequelae of cerebral infarction: Secondary | ICD-10-CM | POA: Diagnosis not present

## 2018-08-25 DIAGNOSIS — Z0000 Encounter for general adult medical examination without abnormal findings: Secondary | ICD-10-CM

## 2018-08-25 DIAGNOSIS — I10 Essential (primary) hypertension: Secondary | ICD-10-CM

## 2018-08-25 DIAGNOSIS — Z1211 Encounter for screening for malignant neoplasm of colon: Secondary | ICD-10-CM | POA: Diagnosis not present

## 2018-08-25 DIAGNOSIS — K5900 Constipation, unspecified: Secondary | ICD-10-CM | POA: Diagnosis not present

## 2018-08-25 DIAGNOSIS — Z7189 Other specified counseling: Secondary | ICD-10-CM

## 2018-08-25 DIAGNOSIS — E663 Overweight: Secondary | ICD-10-CM

## 2018-08-25 DIAGNOSIS — E7800 Pure hypercholesterolemia, unspecified: Secondary | ICD-10-CM

## 2018-08-25 DIAGNOSIS — R7303 Prediabetes: Secondary | ICD-10-CM

## 2018-08-25 DIAGNOSIS — E538 Deficiency of other specified B group vitamins: Secondary | ICD-10-CM | POA: Diagnosis not present

## 2018-08-25 DIAGNOSIS — H4031X3 Glaucoma secondary to eye trauma, right eye, severe stage: Secondary | ICD-10-CM | POA: Diagnosis not present

## 2018-08-25 MED ORDER — CYANOCOBALAMIN 1000 MCG/ML IJ SOLN
1000.0000 ug | Freq: Once | INTRAMUSCULAR | Status: AC
  Administered 2018-08-25: 1000 ug via INTRAMUSCULAR

## 2018-08-25 NOTE — Assessment & Plan Note (Signed)
Chronic, stable. Continue lipitor.  The ASCVD Risk score Mikey Bussing DC Jr., et al., 2013) failed to calculate for the following reasons:   The patient has a prior MI or stroke diagnosis

## 2018-08-25 NOTE — Assessment & Plan Note (Signed)
A1c stable in prediabetes range. 

## 2018-08-25 NOTE — Assessment & Plan Note (Signed)
Chronic, stable. Continue current regimen. 

## 2018-08-25 NOTE — Assessment & Plan Note (Signed)
10 lb weight loss noted.

## 2018-08-25 NOTE — Assessment & Plan Note (Signed)
No bruits appreciated today  

## 2018-08-25 NOTE — Assessment & Plan Note (Signed)
Managed with metamucil.

## 2018-08-25 NOTE — Assessment & Plan Note (Signed)
Advanced planning - has at home. HCPOA is wifeand/or son. Does not want prolonged life support. If doesn't have capacity to make decisions would not want artificial prolongation of life. Wants to donate body to [facility] to bring [place] copy.

## 2018-08-25 NOTE — Assessment & Plan Note (Signed)

## 2018-08-25 NOTE — Assessment & Plan Note (Signed)
Preventative protocols reviewed and updated unless pt declined. Discussed healthy diet and lifestyle.  

## 2018-08-25 NOTE — Assessment & Plan Note (Addendum)
Continue plavix, statin. Has not returned to see neurology.

## 2018-08-25 NOTE — Assessment & Plan Note (Signed)
Persistently low. b12 shot today then reviewed importance of daily b12 replacement. Wife aware as well.

## 2018-08-25 NOTE — Progress Notes (Signed)
This visit was conducted in person.  BP 112/76 (BP Location: Left Arm, Patient Position: Sitting, Cuff Size: Normal)    Pulse 63    Temp 98.1 F (36.7 C) (Temporal)    Ht 6' (1.829 m)    Wt 219 lb 9 oz (99.6 kg)    SpO2 97%    BMI 29.78 kg/m    CC: AMW Subjective:    Patient ID: Evan Morales, male    DOB: 05-23-1945, 73 y.o.   MRN: [id number]  HPI: Shantel Wesely is a 73 y.o. male presenting on 08/25/2018 for Medicare Wellness (Pt accompanied by wife, [name]. )   Did not see health coach this year.   Hearing Screening   125Hz  250Hz  500Hz  1000Hz  2000Hz  3000Hz  4000Hz  6000Hz  8000Hz   Right ear:   20 20 25   0    Left ear:   25 40 40  0    Vision Screening Comments: Last eye exam, 05/2018    Office Visit from 08/25/2018 in [facility]  PHQ-2 Total Score  0      Fall Risk  08/25/2018 08/06/2017 11/19/2016 08/05/2016 07/27/2015  Falls in the past year? 0 No No No No  Comment - - - - "pt rolled out of bed"  Number falls in past yr: - - - - -  Injury with Fall? - - - - -  Risk for fall due to : - - - - Medication side effect    Known severe glaucoma R eye followed by [facility] ([facility] --> [facility]).   Has been seen by SM for L knee OA pain treating with topical voltaren.   Hospitalized with ?TIA 09/2016. MRI brain without acute process. 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 Aspirin changed to plavix. Saw neurology in follow up but has not returned.   Preventative: Colonoscopy 4/2007withadenomatous polyp. Rec rpt 5 yrs. Rpt colonoscopy deferred after CVA, has decided to proceed with iFOBs in interim (1 positive 03/2012, one negative 2017, did not return one in 2018 or 2019). Declined colonoscopy and cologuard last year.  Prostate - always normal. Improved nocturia - doesn't drink fluids after 6pm. Desires to stop screening.  Flu yearly. Pneumovax 2013, prevnar2017 Td 2011.  zostavax 06/2012. shingrix - discussed, declines.    Advanced planning - has at home. HCPOA is wifeand/or son. Does not want prolonged life support. If doesn't have capacity to make decisions would not want artificial prolongation of life. W[facility] to [facility] body to [facility] to bring [place] copy.  Seat belt use discussed.  Sunscreen use discussed. No changing moles on skin. Non smoker  Alcohol - none  Dentist - does not see - does brush teeth and floss regularly Eye exam - sees Dr Edilia Bo regularly  Bowel - some constipation treated with metamucil Bladder - no incontinence   Caffeine: 2-3 cups coffee/day  Lives with wife. 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 Interim medical history since our last visit reviewed. Allergies and medications reviewed and updated. Outpatient Medications Prior to Visit  Medication Sig Dispense Refill   atorvastatin (LIPITOR) 40 MG tablet TAKE 1 TABLET BY MOUTH EVERY DAY IN THE EVENING 90 tablet 0   brimonidine (ALPHAGAN) 0.2 % ophthalmic solution PLACE 1 DROP INTO THE RIGHT EYE 2 TIMES DAILY.  clopidogrel (PLAVIX) 75 MG tablet TAKE 1 TABLET BY MOUTH EVERY DAY 90 tablet 1   co-enzyme Q-10 50 MG capsule Take 4 capsules (200 mg total) by mouth daily. 60 capsule 1   Cyanocobalamin (B-12) 1000 MCG SUBL Place 1 tablet under the tongue daily. 30 each    diclofenac sodium (VOLTAREN) 1 % GEL Apply 4 g topically 4 (four) times daily as needed. 500 g 6   dorzolamide-timolol (COSOPT) 22.3-6.8 MG/ML ophthalmic solution Place 1 drop into the right eye 2 (two) times daily.     hydrochlorothiazide (MICROZIDE) 12.5 MG capsule Take 1 capsule (12.5 mg total) by mouth daily. 90 capsule 1   polyethylene glycol powder (GLYCOLAX/MIRALAX) powder Take 1/2 to a whole capful mixed in 8 oz fluid as needed for  constipation, hold for diarrhea. (Patient taking differently: Take 0.5 g by mouth daily as needed for mild constipation. )     quinapril (ACCUPRIL) 20 MG tablet TAKE 1 TABLET BY MOUTH EVERY DAY 90 tablet 1   No facility-administered medications prior to visit.      Per HPI unless specifically indicated in ROS section below Review of Systems  Constitutional: Negative for activity change, appetite change, chills, fatigue, fever and unexpected weight change.  HENT: Negative for hearing loss.   Eyes: Negative for visual disturbance.  Respiratory: Negative for cough, chest tightness, shortness of breath and wheezing.   Cardiovascular: Negative for chest pain, palpitations and leg swelling.  Gastrointestinal: Positive for constipation. Negative for abdominal distention, abdominal pain, blood in stool, diarrhea, nausea and vomiting.  Genitourinary: Negative for difficulty urinating and hematuria.  Musculoskeletal: Negative for arthralgias, myalgias and neck pain.  Skin: Negative for rash.  Neurological: Negative for dizziness, seizures, syncope and headaches.       Denies memory trouble  Hematological: Negative for adenopathy. Does not bruise/bleed easily.  Psychiatric/Behavioral: Negative for dysphoric mood. The patient is not nervous/anxious.    Objective:    BP 112/76 (BP Location: Left Arm, Patient Position: Sitting, Cuff Size: Normal)    Pulse 63    Temp 98.1 F (36.7 C) (Temporal)    Ht 6' (1.829 m)    Wt 219 lb 9 oz (99.6 kg)    SpO2 97%    BMI 29.78 kg/m   Wt Readings from Last 3 Encounters:  08/25/18 219 lb 9 oz (99.6 kg)  08/13/17 228 lb 8 oz (103.6 kg)  08/06/17 226 lb 4 oz (102.6 kg)    Physical Exam Vitals signs and nursing note reviewed.  Constitutional:      General: He is not in acute distress.    Appearance: Normal appearance. He is well-developed. He is not ill-appearing.  HENT:     Head: Normocephalic and atraumatic.     Right Ear: Hearing, tympanic membrane, ear  canal and external ear normal.     Left Ear: Hearing, tympanic membrane, ear canal and external ear normal.     Nose: Nose normal.     Mouth/Throat:     Mouth: Mucous membranes are moist.     Pharynx: Uvula midline. No oropharyngeal exudate or posterior oropharyngeal erythema.  Eyes:     General: No scleral icterus.    Extraocular Movements: Extraocular movements intact.     Conjunctiva/sclera: Conjunctivae normal.     Pupils: Pupils are equal, round, and reactive to light.  Neck:     Musculoskeletal: Normal range of motion and neck supple.     Vascular: No carotid bruit.  Cardiovascular:     Rate  and Rhythm: Normal rate and regular rhythm.     Pulses: Normal pulses.          Radial pulses are 2+ on the right side and 2+ on the left side.     Heart sounds: Normal heart sounds. No murmur.  Pulmonary:     Effort: Pulmonary effort is normal. No respiratory distress.     Breath sounds: Normal breath sounds. No wheezing, rhonchi or rales.  Abdominal:     General: Abdomen is flat. Bowel sounds are normal. There is no distension.     Palpations: Abdomen is soft. There is no mass.     Tenderness: There is no abdominal tenderness. There is no guarding or rebound.     Hernia: No hernia is present.  Musculoskeletal: Normal range of motion.     Right lower leg: No edema.     Left lower leg: No edema.  Lymphadenopathy:     Cervical: No cervical adenopathy.  Skin:    General: Skin is warm and dry.     Findings: No rash.  Neurological:     General: No focal deficit present.     Mental Status: He is alert and oriented to person, place, and time.     Comments:  CN grossly intact, station and gait intact Recall 3/3 Calculation D-L-[id number]  Psychiatric:        Mood and Affect: Mood normal.        Behavior: Behavior normal.        Thought Content: Thought content normal.        Judgment: Judgment normal.       Results for orders placed or performed in visit on 08/21/18  Hemoglobin A1c    Result Value Ref Range   Hgb A1c MFr Bld 6.0 4.6 - 6.5 %  Comprehensive metabolic panel  Result Value Ref Range   Sodium 140 135 - 145 mEq/L   Potassium 4.3 3.5 - 5.1 mEq/L   Chloride 102 96 - 112 mEq/L   CO2 30 19 - 32 mEq/L   Glucose, Bld 96 70 - 99 mg/dL   BUN 8 6 - 23 mg/dL   Creatinine, Ser 0.83 0.40 - 1.50 mg/dL   Total Bilirubin 1.8 (H) 0.2 - 1.2 mg/dL   Alkaline Phosphatase 67 39 - 117 U/L   AST 15 0 - 37 U/L   ALT 18 0 - 53 U/L   Total Protein 7.0 6.0 - 8.3 g/dL   Albumin 4.6 3.5 - 5.2 g/dL   Calcium 9.8 8.4 - 10.5 mg/dL   GFR 90.64 >60.00 mL/min  Lipid panel  Result Value Ref Range   Cholesterol 82 0 - 200 mg/dL   Triglycerides 68.0 0.0 - 149.0 mg/dL   HDL 40.70 >39.00 mg/dL   VLDL 13.6 0.0 - 40.0 mg/dL   LDL Cholesterol 28 0 - 99 mg/dL   Total CHOL/HDL Ratio 2    NonHDL 41.22   Vitamin B12  Result Value Ref Range   Vitamin B-12 152 (L) 211 - 911 pg/mL   Assessment & Plan:   Problem List Items Addressed This Visit    Vitamin B12 deficiency    Persistently low. b12 shot today then reviewed importance of daily b12 replacement. Wife aware as well.       Prediabetes    A1c stable in prediabetes range.       Overweight with body mass index (BMI) 25.0-29.9    10 lb weight loss noted.  Medicare annual wellness visit, subsequent - Primary    I have personally reviewed the Medicare Annual Wellness questionnaire and have noted 1. The patient's medical and social history 2. Their use of alcohol, tobacco or illicit drugs 3. Their current medications and supplements 4. The patient's functional ability including ADL's, fall risks, home safety risks and hearing or visual impairment. Cognitive function has been assessed and addressed as indicated.  5. Diet and physical activity 6. Evidence for depression or mood disorders The patients weight, height, BMI have been recorded in the chart. I have made referrals, counseling and provided education to the patient  based on review of the above and I have provided the pt with a written personalized care plan for preventive services. Provider list updated.. See scanned questionairre as needed for further documentation. Reviewed preventative protocols and updated unless pt declined.       HYPERCHOLESTEROLEMIA    Chronic, stable. Continue lipitor.  The ASCVD Risk score Mikey Bussing DC Jr., et al., 2013) failed to calculate for the following reasons:   The patient has a prior MI or stroke diagnosis       History of cerebrovascular accident (CVA) with residual deficit    Continue plavix, statin. Has not returned to see neurology.       Health maintenance examination    Preventative protocols reviewed and updated unless pt declined. Discussed healthy diet and lifestyle.       Essential hypertension    Chronic, stable. Continue current regimen.       Constipation    Managed with metamucil.       Bilateral carotid artery stenosis    No bruits appreciated today.       Angle recession glaucoma, right, severe stage   Advanced care planning/counseling discussion    Advanced planning - has at home. HCPOA is wifeand/or son. Does not want prolonged life support. If doesn't have capacity to make decisions would not want artificial prolongation of life. Wants to donate body to [facility] to bring [place] copy.        Other Visit Diagnoses    Special screening for malignant neoplasms, colon       Relevant Orders   Fecal occult blood, imunochemical       Meds ordered this encounter  Medications   cyanocobalamin ((VITAMIN B-12)) injection 1,000 mcg   Orders Placed This Encounter  Procedures   Fecal occult blood, imunochemical    Standing Status:   Future    Standing Expiration Date:   08/25/2019    Follow up plan: Return in about 6 months (around 02/25/2019) for follow up visit.  Ria Bush, MD

## 2018-08-25 NOTE — Patient Instructions (Addendum)
B12 shot today. Then start vit b12 10109mg dissolvable tablet daily as your levels were low.  Pass by lab to pick up stool kit.  If interested, check with pharmacy about new 2 shot shingles series (shingrix).  Check on advanced directive at home, bring [place] copy at your convenience. Let [place] if you don't have one and would like information about setting it up  You are doing well today. Return as needed or in 6 months for follow up visit.   Health Maintenance After Age 5134After age 74 you are at a higher risk for certain long-term diseases and infections as well as injuries from falls. Falls are a major cause of broken bones and head injuries in people who are older than age 74 Getting regular preventive care can help to keep you healthy and well. Preventive care includes getting regular testing and making lifestyle changes as recommended by your health care provider. Talk with your health care provider about:  Which screenings and tests you should have. A screening is a test that checks for a disease when you have no symptoms.  A diet and exercise plan that is right for you. What should I know about screenings and tests to prevent falls? Screening and testing are the best ways to find a health problem early. Early diagnosis and treatment give you the best chance of managing medical conditions that are common after age 74 Certain conditions and lifestyle choices may make you more likely to have a fall. Your health care provider may recommend:  Regular vision checks. Poor vision and conditions such as cataracts can make you more likely to have a fall. If you wear glasses, make sure to get your prescription updated if your vision changes.  Medicine review. Work with your health care provider to regularly review all of the medicines you are taking, including over-the-counter medicines. Ask your health care provider about any side effects that may make you more likely to have a fall. Tell your  health care provider if any medicines that you take make you feel dizzy or sleepy.  Osteoporosis screening. Osteoporosis is a condition that causes the bones to get weaker. This can make the bones weak and cause them to break more easily.  Blood pressure screening. Blood pressure changes and medicines to control blood pressure can make you feel dizzy.  Strength and balance checks. Your health care provider may recommend certain tests to check your strength and balance while standing, walking, or changing positions.  Foot health exam. Foot pain and numbness, as well as not wearing proper footwear, can make you more likely to have a fall.  Depression screening. You may be more likely to have a fall if you have a fear of falling, feel emotionally low, or feel unable to do activities that you used to do.  Alcohol use screening. Using too much alcohol can affect your balance and may make you more likely to have a fall. What actions can I take to lower my risk of falls? General instructions  Talk with your health care provider about your risks for falling. Tell your health care provider if: ? You fall. Be sure to tell your health care provider about all falls, even ones that seem minor. ? You feel dizzy, sleepy, or off-balance.  Take over-the-counter and prescription medicines only as told by your health care provider. These include any supplements.  Eat a healthy diet and maintain a healthy weight. A healthy diet includes low-fat dairy products,  low-fat (lean) meats, and fiber from whole grains, beans, and lots of fruits and vegetables. Home safety  Remove any tripping hazards, such as rugs, cords, and clutter.  Install safety equipment such as grab bars in bathrooms and safety rails on stairs.  Keep rooms and walkways well-lit. Activity   Follow a regular exercise program to stay fit. This will help you maintain your balance. Ask your health care provider what types of exercise are  appropriate for you.  If you need a cane or walker, use it as recommended by your health care provider.  Wear supportive shoes that have nonskid soles. Lifestyle  Do not drink alcohol if your health care provider tells you not to drink.  If you drink alcohol, limit how much you have: ? 0-1 drink a day for women. ? 0-2 drinks a day for men.  Be aware of how much alcohol is in your drink. In the [place] one drink equals one typical bottle of beer (12 oz), one-half glass of wine (5 oz), or one shot of hard liquor (1 oz).  Do not use any products that contain nicotine or tobacco, such as cigarettes and e-cigarettes. If you need help quitting, ask your health care provider. Summary  Having a healthy lifestyle and getting preventive care can help to protect your health and wellness after age 63.  Screening and testing are the best way to find a health problem early and help you avoid having a fall. Early diagnosis and treatment give you the best chance for managing medical conditions that are more common for people who are older than age 98.  Falls are a major cause of broken bones and head injuries in people who are older than age 31. Take precautions to prevent a fall at home.  Work with your health care provider to learn what changes you can make to improve your health and wellness and to prevent falls. This information is not intended to replace advice given to you by your health care provider. Make sure you discuss any questions you have with your health care provider. Document Released: 11/27/2016 Document Revised: 05/07/2018 Document Reviewed: 11/27/2016 Elsevier Patient Education  2020 [employer].

## 2018-08-27 ENCOUNTER — Other Ambulatory Visit: Payer: Self-pay | Admitting: Family Medicine

## 2018-09-01 ENCOUNTER — Encounter: Payer: Self-pay | Admitting: Family Medicine

## 2018-09-01 ENCOUNTER — Other Ambulatory Visit (INDEPENDENT_AMBULATORY_CARE_PROVIDER_SITE_OTHER): Payer: PPO

## 2018-09-01 DIAGNOSIS — Z1211 Encounter for screening for malignant neoplasm of colon: Secondary | ICD-10-CM | POA: Diagnosis not present

## 2018-09-01 LAB — FECAL OCCULT BLOOD, IMMUNOCHEMICAL: Fecal Occult Bld: NEGATIVE

## 2018-09-01 LAB — FECAL OCCULT BLOOD, GUAIAC: Fecal Occult Blood: NEGATIVE

## 2018-10-04 ENCOUNTER — Other Ambulatory Visit: Payer: Self-pay | Admitting: Family Medicine

## 2018-11-05 ENCOUNTER — Other Ambulatory Visit: Payer: Self-pay | Admitting: Family Medicine

## 2018-12-07 ENCOUNTER — Other Ambulatory Visit: Payer: Self-pay | Admitting: Family Medicine

## 2019-02-08 ENCOUNTER — Telehealth: Payer: Self-pay | Admitting: *Deleted

## 2019-02-08 NOTE — Telephone Encounter (Signed)
Unfortunately I don't know that they will give vaccine to patient as he's 17. He would fall under phase 2.  Will see what facility says.

## 2019-02-08 NOTE — Telephone Encounter (Signed)
Patient's wife called stating that she is over 20 and has an appointment to get the covid vaccine on 02/21/18. Patient stated that her husband is not yet 9, but feels that he needs the vaccine because of him having had a stroke and is blind. Patient's wife stated that she is just going to take him in the car with her for her appointment hoping that they will go ahead and give him the vaccine. Patient is wanting a copy of her husbands records or a letter from Dr. Danise Mina about his history to take with her for her appointment.  Advised Mrs. O"shields that I am not sure if they will give him the vaccine without an appointment. Advised patient's wife that she should contact the facility that she is going to and see if they will give her husband the vaccine with his health information?  Advised patient's wife that we will be glad to give her whatever she needs if they are willing to give him the vaccine. Mrs. [name] stated that she will have her daughter check on this and will let Dr. Danise Mina know if she needs anything from him.

## 2019-02-18 ENCOUNTER — Ambulatory Visit: Payer: PPO | Admitting: Internal Medicine

## 2019-02-26 ENCOUNTER — Other Ambulatory Visit: Payer: Self-pay

## 2019-02-26 ENCOUNTER — Ambulatory Visit (INDEPENDENT_AMBULATORY_CARE_PROVIDER_SITE_OTHER): Payer: PPO | Admitting: Family Medicine

## 2019-02-26 ENCOUNTER — Encounter: Payer: Self-pay | Admitting: Family Medicine

## 2019-02-26 VITALS — BP 118/66 | HR 64 | Temp 97.8°F | Ht 72.0 in | Wt 213.4 lb

## 2019-02-26 DIAGNOSIS — Z23 Encounter for immunization: Secondary | ICD-10-CM

## 2019-02-26 DIAGNOSIS — I10 Essential (primary) hypertension: Secondary | ICD-10-CM | POA: Diagnosis not present

## 2019-02-26 DIAGNOSIS — K5900 Constipation, unspecified: Secondary | ICD-10-CM

## 2019-02-26 DIAGNOSIS — I6930 Unspecified sequelae of cerebral infarction: Secondary | ICD-10-CM

## 2019-02-26 DIAGNOSIS — R7303 Prediabetes: Secondary | ICD-10-CM

## 2019-02-26 MED ORDER — METAMUCIL SMOOTH TEXTURE 58.6 % PO POWD: 1.0000 | Freq: Every day | ORAL | Status: AC

## 2019-02-26 NOTE — Assessment & Plan Note (Signed)
Chronic, stable. Continue current regimen. 

## 2019-02-26 NOTE — Assessment & Plan Note (Addendum)
Ongoing trouble despite current bowel regimen.  Reviewed good water and fiber in diet.  May add miralax to his metamucil and stool softener.  Consider linzess if not improved.

## 2019-02-26 NOTE — Progress Notes (Signed)
This visit was conducted in person.  BP 118/66 (BP Location: Left Arm, Patient Position: Sitting, Cuff Size: Normal)   Pulse 64   Temp 97.8 F (36.6 C) (Temporal)   Ht 6' (1.829 m)   Wt 213 lb 6 oz (96.8 kg)   SpO2 96%   BMI 28.94 kg/m    CC: 6 mo f/u visit Subjective:    Patient ID: Evan Morales, male    DOB: 1946/01/01, 74 y.o.   MRN: [id number]  HPI: Evan Morales is a 74 y.o. 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  Continues plavix.   Glaucoma - followed by Dr Doree Albee - notes progressive vision loss.   Periods of constipation - up to 6-7 days. This is despite capful metamucil and up to 4 stool softeners daily. He feels he gets enough fiber and water in the diet. When he has a bowel movement, feels it's normal, soft.   6 lb weight loss noted - 16 lbs in the past year - appetite is down.      Relevant past medical, surgical, family and social history reviewed and updated as indicated. Interim medical history since our last visit reviewed. Allergies and medications reviewed and updated. Outpatient Medications Prior to Visit  Medication Sig Dispense Refill  . atorvastatin (LIPITOR) 40 MG tablet TAKE 1 TABLET BY MOUTH EVERY DAY IN THE EVENING 90 tablet 2  . brimonidine (ALPHAGAN) 0.2 % ophthalmic solution PLACE 1 DROP INTO THE RIGHT EYE 2 TIMES DAILY.    Marland Kitchen clopidogrel (PLAVIX) 75 MG tablet TAKE 1 TABLET BY MOUTH EVERY DAY 90 tablet 1  . Cyanocobalamin (B-12) 1000 MCG SUBL Place 1 tablet under the tongue daily. 30 each   . diclofenac sodium (VOLTAREN) 1 % GEL Apply 4 g topically 4 (four) times daily as needed. 500 g 6  . dorzolamide-timolol (COSOPT) 22.3-6.8 MG/ML ophthalmic solution Place 1 drop into the right eye 2 (two) times daily.    . hydrochlorothiazide (MICROZIDE) 12.5 MG capsule TAKE 1 CAPSULE BY MOUTH  EVERY DAY 90 capsule 2  . polyethylene glycol powder (GLYCOLAX/MIRALAX) powder Take 1/2 to a whole capful mixed in 8 oz fluid as needed for constipation, hold for diarrhea. (Patient taking differently: Take 0.5 g by mouth daily as needed for mild constipation. )    . quinapril (ACCUPRIL) 20 MG tablet TAKE 1 TABLET BY MOUTH EVERY DAY 90 tablet 3  . co-enzyme Q-10 50 MG capsule Take 4 capsules (200 mg total) by mouth daily. 60 capsule 1   No facility-administered medications prior to visit.     Per HPI unless specifically indicated in ROS section below Review of Systems Objective:    BP 118/66 (BP Location: Left Arm, Patient Position: Sitting, Cuff Size: Normal)   Pulse 64   Temp 97.8 F (36.6 C) (Temporal)   Ht 6' (1.829 m)   Wt 213 lb 6 oz (96.8 kg)   SpO2 96%   BMI 28.94 kg/m   Wt Readings from Last 3 Encounters:  02/26/19 213 lb 6 oz (96.8 kg)  08/25/18 219 lb 9 oz (99.6 kg)  08/13/17 228 lb 8 oz (103.6 kg)    Physical Exam Vitals and nursing note reviewed.  Constitutional:      Appearance: Normal appearance. He is not ill-appearing.  Eyes:     Extraocular Movements:  Extraocular movements intact.     Conjunctiva/sclera: Conjunctivae normal.     Comments: Cataract R eye, s/p cataract removal on left  Cardiovascular:     Rate and Rhythm: Normal rate and regular rhythm.     Pulses: Normal pulses.     Heart sounds: Normal heart sounds. No murmur.  Pulmonary:     Effort: Pulmonary effort is normal. No respiratory distress.     Breath sounds: Normal breath sounds. No wheezing, rhonchi or rales.  Neurological:     Mental Status: He is alert.  Psychiatric:        Mood and Affect: Mood normal.        Behavior: Behavior normal.       Results for orders placed or performed in visit on 09/01/18  Fecal Occult Blood, Guaiac  Result Value Ref Range   Fecal Occult Blood Negative    Assessment & Plan:  This visit occurred during the SARS-CoV-2 public health emergency.  Safety  protocols were in place, including screening questions prior to the visit, additional usage of staff PPE, and extensive cleaning of exam room while observing appropriate contact time as indicated for disinfecting solutions.   Problem List Items Addressed This Visit    Prediabetes    Anticipate ongoing improvement with weight loss noted.       History of cerebrovascular accident (CVA) with residual deficit    Continues plavix, statin, and good BP control.       Essential hypertension    Chronic, stable. Continue current regimen.       Constipation    Ongoing trouble despite current bowel regimen.  Reviewed good water and fiber in diet.  May add miralax to his metamucil and stool softener.  Consider linzess if not improved.        Other Visit Diagnoses    Need for influenza vaccination    -  Primary   Relevant Orders   Flu Vaccine QUAD High Dose(Fluad) (Completed)       Meds ordered this encounter  Medications  . psyllium (METAMUCIL SMOOTH TEXTURE) 58.6 % powder    Sig: Take 1 packet by mouth daily.   Orders Placed This Encounter  Procedures  . Flu Vaccine QUAD High Dose(Fluad)    Patient instructions: Flu shot today.  Try adding miralax to your bowel regimen and let me know how you do. Try extra strength tylenol for headache.   Good to see you today.  Return as needed or in 6 months for wellness visit/physical.   Follow up plan: Return in about 6 months (around 08/26/2019) for annual exam, prior fasting for blood work, medicare wellness visit.  Ria Bush, MD

## 2019-02-26 NOTE — Assessment & Plan Note (Signed)
Anticipate ongoing improvement with weight loss noted.

## 2019-02-26 NOTE — Patient Instructions (Addendum)
Flu shot today.  Try adding miralax to your bowel regimen and let me know how you do. Try extra strength tylenol for headache.   Good to see you today.  Return as needed or in 6 months for wellness visit/physical.   Constipation, Adult Constipation is when a person has fewer bowel movements in a week than normal, has difficulty having a bowel movement, or has stools that are dry, hard, or larger than normal. Constipation may be caused by an underlying condition. It may become worse with age if a person takes certain medicines and does not take in enough fluids. Follow these instructions at home: Eating and drinking   Eat foods that have a lot of fiber, such as fresh fruits and vegetables, whole grains, and beans.  Limit foods that are high in fat, low in fiber, or overly processed, such as french fries, hamburgers, cookies, candies, and soda.  Drink enough fluid to keep your urine clear or pale yellow. General instructions  Exercise regularly or as told by your health care provider.  Go to the restroom when you have the urge to go. Do not hold it in.  Take over-the-counter and prescription medicines only as told by your health care provider. These include any fiber supplements.  Practice pelvic floor retraining exercises, such as deep breathing while relaxing the lower abdomen and pelvic floor relaxation during bowel movements.  Watch your condition for any changes.  Keep all follow-up visits as told by your health care provider. This is important. Contact a health care provider if:  You have pain that gets worse.  You have a fever.  You do not have a bowel movement after 4 days.  You vomit.  You are not hungry.  You lose weight.  You are bleeding from the anus.  You have thin, pencil-like stools. Get help right away if:  You have a fever and your symptoms suddenly get worse.  You leak stool or have blood in your stool.  Your abdomen is bloated.  You have severe  pain in your abdomen.  You feel dizzy or you faint. This information is not intended to replace advice given to you by your health care provider. Make sure you discuss any questions you have with your health care provider. Document Revised: 12/27/2016 Document Reviewed: 07/05/2015 Elsevier Patient Education  2020 [employer].

## 2019-02-26 NOTE — Assessment & Plan Note (Signed)
Continues plavix, statin, and good BP control.

## 2019-03-08 ENCOUNTER — Telehealth: Payer: Self-pay

## 2019-03-08 NOTE — Telephone Encounter (Signed)
Spoke with pt's wife, [name] (on dpr), relaying Dr. Synthia Innocent message.  She verbalizes understanding.

## 2019-03-08 NOTE — Telephone Encounter (Signed)
Pt is scheduled for his Covid vaccine this week. On the pre-screening, they ask if he is on any blood thinners. He is on Plavix. They advised him to reach out to his PCP prior to the vaccine to see if he has any contraindications to it with his Plavix. Please advise,

## 2019-03-08 NOTE — Telephone Encounter (Signed)
Ok to get shot on plavix.  Glad he's getting it.

## 2019-03-18 ENCOUNTER — Ambulatory Visit: Payer: Self-pay

## 2019-03-18 NOTE — Telephone Encounter (Signed)
Patient called stating that he is constipated. He states he has not been dx with chronic constipation. His last BM 7-10 days ago. He feels rectal pressure when he strains. He denies abdominal pain or rectal pain. He has tried miralax without success. He admits that he does not drink a lot of water. Patient instructed that miralax needs water to work. He verbalized understanding. He states he had a little blood once when straining. He denies other symptoms. Per protocol patient will seek care at [facility] or ER today if able due to weather . Care advice read to patient. He verbalized understanding.Please advise in am.  Reason for Disposition . Last bowel movement (BM) > 4 days ago  Answer Assessment - Initial Assessment Questions 1. STOOL PATTERN OR FREQUENCY: "How often do you pass bowel movements (BMs)?"  (Normal range: tid to q 3 days)  "When was the last BM passed?"       1 time every 3 days 2. STRAINING: "Do you have to strain to have a BM?"    yes 3. RECTAL PAIN: "Does your rectum hurt when the stool comes out?" If so, ask: "Do you have hemorrhoids? How bad is the pain?"  (Scale 1-10; or mild, moderate, severe)     Pressure with  straining 4. STOOL COMPOSITION: "Are the stools hard?"     Hard normal elongated 5. BLOOD ON STOOLS: "Has there been any blood on the toilet tissue or on the surface of the BM?" If so, ask: "When was the last time?"      once 6. CHRONIC CONSTIPATION: "Is this a new problem for you?"  If no, ask: "How long have you had this problem?" (days, weeks, months)     Never dx has now gone 10 days 7. CHANGES IN DIET: "Have there been any recent changes in your diet?"     no 8. MEDICATIONS: "Have you been taking any new medications?"    No 9. LAXATIVES: "Have you been using any laxatives or enemas?"  If yes, ask "What, how often, and when was the last time?"   Stool softener saline laxative 2 times 10. CAUSE: "What do you think is causing the constipation?"      unsure 11. OTHER  SYMPTOMS: "Do you have any other symptoms?" (e.g., abdominal pain, fever, vomiting)      none 12. PREGNANCY: "Is there any chance you are pregnant?" "When was your last menstrual period?"     N/A  Protocols used: CONSTIPATION-A-AH

## 2019-03-18 NOTE — Telephone Encounter (Signed)
Pt denies any pain or discomfort with constipation. Advised to increase water intake. Scheduled pt for apt on Monday 2/22 because clinic is closed today due to weather and may be closed tomorrow also and pt did not want to do a virtual visit. Advised if anything worsens to contact the office. Pt verbalized understanding.

## 2019-03-18 NOTE — Telephone Encounter (Signed)
Noted. Thanks.

## 2019-03-19 ENCOUNTER — Encounter: Payer: Self-pay | Admitting: Family Medicine

## 2019-03-19 ENCOUNTER — Ambulatory Visit (INDEPENDENT_AMBULATORY_CARE_PROVIDER_SITE_OTHER): Payer: PPO | Admitting: Family Medicine

## 2019-03-19 ENCOUNTER — Other Ambulatory Visit: Payer: Self-pay

## 2019-03-19 VITALS — BP 112/68 | HR 70 | Temp 98.0°F | Ht 72.0 in | Wt 207.2 lb

## 2019-03-19 DIAGNOSIS — R634 Abnormal weight loss: Secondary | ICD-10-CM | POA: Diagnosis not present

## 2019-03-19 DIAGNOSIS — K5641 Fecal impaction: Secondary | ICD-10-CM | POA: Diagnosis not present

## 2019-03-19 MED ORDER — POLYETHYLENE GLYCOL 3350 17 GM/SCOOP PO POWD: ORAL | 0 refills | Status: DC

## 2019-03-19 NOTE — Progress Notes (Signed)
This visit was conducted in person.  BP 112/68 (BP Location: Left Arm, Patient Position: Sitting, Cuff Size: Normal)   Pulse 70   Temp 98 F (36.7 C) (Temporal)   Ht 6' (1.829 m)   Wt 207 lb 4 oz (94 kg)   SpO2 97%   BMI 28.11 kg/m    CC: constipation  Subjective:    Patient ID: Evan Morales, male    DOB: Apr 27, 1945, 74 y.o.   MRN: [id number]  HPI: Evan Morales is a 74 y.o. male presenting on 03/19/2019 for Constipation (C/o constipation.  Started about 1 mo ago.  Tried OTC stool softner and Metamucil, no helpful. )   See recent note for details.  Worsening constipation despite regular metamucil and stool softener use. He did not try miralax. 1 BM in last 10 days. Notes rectal discomfort when he tries to strain. Blood streak with wiping yesterday. Unable to have a BM - feels rectal blockage.   Passing gas ok. No abd pain, nausea/vomiting.   Continued weight loss noted - 6 lbs in the last 2 weeks. Appetite decreased due to discomfort.      Relevant past medical, surgical, family and social history reviewed and updated as indicated. Interim medical history since our last visit reviewed. Allergies and medications reviewed and updated. Outpatient Medications Prior to Visit  Medication Sig Dispense Refill  . atorvastatin (LIPITOR) 40 MG tablet TAKE 1 TABLET BY MOUTH EVERY DAY IN THE EVENING 90 tablet 2  . brimonidine (ALPHAGAN) 0.2 % ophthalmic solution PLACE 1 DROP INTO THE RIGHT EYE 2 TIMES DAILY.    Marland Kitchen clopidogrel (PLAVIX) 75 MG tablet TAKE 1 TABLET BY MOUTH EVERY DAY 90 tablet 1  . Cyanocobalamin (B-12) 1000 MCG SUBL Place 1 tablet under the tongue daily. 30 each   . diclofenac sodium (VOLTAREN) 1 % GEL Apply 4 g topically 4 (four) times daily as needed. 500 g 6  . dorzolamide-timolol (COSOPT) 22.3-6.8 MG/ML ophthalmic solution Place 1 drop into the right eye 2 (two) times daily.    . hydrochlorothiazide (MICROZIDE) 12.5 MG capsule TAKE 1 CAPSULE BY MOUTH EVERY DAY  90 capsule 2  . psyllium (METAMUCIL SMOOTH TEXTURE) 58.6 % powder Take 1 packet by mouth daily.    . quinapril (ACCUPRIL) 20 MG tablet TAKE 1 TABLET BY MOUTH EVERY DAY 90 tablet 3  . polyethylene glycol powder (GLYCOLAX/MIRALAX) powder Take 1/2 to a whole capful mixed in 8 oz fluid as needed for constipation, hold for diarrhea. (Patient taking differently: Take 0.5 g by mouth daily as needed for mild constipation. )     No facility-administered medications prior to visit.     Per HPI unless specifically indicated in ROS section below Review of Systems Objective:    BP 112/68 (BP Location: Left Arm, Patient Position: Sitting, Cuff Size: Normal)   Pulse 70   Temp 98 F (36.7 C) (Temporal)   Ht 6' (1.829 m)   Wt 207 lb 4 oz (94 kg)   SpO2 97%   BMI 28.11 kg/m   Wt Readings from Last 3 Encounters:  03/19/19 207 lb 4 oz (94 kg)  02/26/19 213 lb 6 oz (96.8 kg)  08/25/18 219 lb 9 oz (99.6 kg)    Physical Exam Vitals and nursing note reviewed.  Constitutional:      Appearance: Normal appearance. He is not ill-appearing.  Genitourinary:    Prostate: Enlarged (40gm). No nodules present.     Rectum: Tenderness present. No mass, anal fissure,  external hemorrhoid or internal hemorrhoid. Normal anal tone.     Comments: Large amt firm stool present high in rectal vault, pt did not tolerate manual disimpaction Neurological:     Mental Status: He is alert.       Results for orders placed or performed in visit on 09/01/18  Fecal Occult Blood, Guaiac  Result Value Ref Range   Fecal Occult Blood Negative    Assessment & Plan:  This visit occurred during the SARS-CoV-2 public health emergency.  Safety protocols were in place, including screening questions prior to the visit, additional usage of staff PPE, and extensive cleaning of exam room while observing appropriate contact time as indicated for disinfecting solutions.   Problem List Items Addressed This Visit    Weight loss,  unintentional    Weight loss with bowel changes - discussed possible GI evaluation.  He had previously deferred colonoscopy due to plavix use after CVA 02/2012 then TIA 2018.  iFOB negative 2017 and 08/2018.  If ongoing weight loss or persistent bowel changes despite above plan, will refer to GI. Close f/u scheduled in 1 wk.      Fecal impaction in rectum [facility]) - Primary    Evidence of this on rectal exam. He did not tolerate disimpaction. Continues passing gas, without abd pain, no clinical concern for obstruction at this time - ok to try continue home treatment for now. He had not yet tried miralax - I recommend he start daily miralax and increase water intake to soften stool. RTC 1 wk close f/u.          Meds ordered this encounter  Medications  . polyethylene glycol powder (GLYCOLAX/MIRALAX) 17 GM/SCOOP powder    Sig: Take 1/2 to a whole capful mixed in 8 oz fluid as needed for constipation, hold for diarrhea.    Dispense:  255 g    Refill:  0   No orders of the defined types were placed in this encounter.   Patient instructions: Try MIRALAX stool softener sent to pharmacy. Start 1/2 capful - may increase up to 2 capfuls daily.  May try milk of magnesia as well.   Follow up plan: Return in about 1 week (around 03/26/2019) for follow up visit.  Ria Bush, MD

## 2019-03-19 NOTE — Assessment & Plan Note (Addendum)
Weight loss with bowel changes - discussed possible GI evaluation.  He had previously deferred colonoscopy due to plavix use after CVA 02/2012 then TIA 2018.  iFOB negative 2017 and 08/2018.  If ongoing weight loss or persistent bowel changes despite above plan, will refer to GI. Close f/u scheduled in 1 wk.

## 2019-03-19 NOTE — Patient Instructions (Addendum)
Try MIRALAX stool softener sent to pharmacy. Start 1/2 capful - may increase up to 2 capfuls daily.  May try milk of magnesia as well.   Fecal Impaction  A fecal impaction is a large, firm amount of stool (feces) that will not pass out of the body. A fecal impaction usually happens in the rectum, which is in the end of the large intestine. A fecal impaction can block the large intestine and cause significant problems. What are the causes? This condition may be caused by anything that slows down bowel movements, including:  The long-term use of laxatives, which are medicines that help you have a bowel movement.  Constipation.  Pain in the rectum. Fecal impaction can occur if you avoid having bowel movements due to the pain. Pain in the rectum can result from a medical condition, such as hemorrhoids or anal fissures.  Narcotic pain-relieving medicines, such as methadone, morphine, or codeine.  Not drinking enough fluids.  Being inactive for a long period of time.  Diseases of the brain or nervous system that damage nerves that control the muscles of the intestines. This condition may also be caused by dementia. What are the signs or symptoms? Common symptoms of this condition include:  A sense of fullness in the rectum but being unable to pass stool.  Changes in bowel patterns. This may include going to the bathroom less often or not at all.  Not having a normal number of bowel movements.  Thin, watery discharge from the rectum.  Pain or cramps in the abdominal area. These often happen after meals. Other symptoms include:  Urinating often.  Nausea, vomiting, and dehydration.  Dizziness and confusion.  Rapid heartbeat.  Fever and sweating.  Changes in blood pressure.  Breathing problems. How is this diagnosed? This condition may be diagnosed based on your symptoms and an exam of your rectum. Sometimes, X-rays or lab tests are done to confirm the diagnosis and to check  for other problems. How is this treated? This condition may be treated by:  Having your health care provider remove the stool using a gloved finger.  Taking medicine.  Using a suppository or enema in the rectum to soften the stool, which can stimulate a bowel movement. Follow these instructions at home: Eating and drinking   Drink enough fluid to keep your urine pale yellow.  Eat foods that are high in fiber, such as beans, whole grains, and fresh fruits and vegetables.  If you begin to get constipated, increase the amount of fiber in your diet. General instructions  Develop bowel habits. An example of a bowel habit is having a bowel movement right after breakfast every day. Be sure to give yourself enough time on the toilet. This may require using enemas, bowel softeners, or suppositories at home, as directed by your health care provider. It may also include using mineral oil or olive oil.  Do exercises as told by your health care provider.  Take over-the-counter and prescription medicines only as told by your health care provider.  Keep all follow-up visits as told by your health care provider. This is important. Contact a health care provider if you:  Have ongoing pain in your rectum.  Need to use an enema or a suppository more than 2 times a week.  Have rectal bleeding.  Continue to have problems. The problems may include not being able to go to the bathroom and having long-term constipation.  Have pain in your abdomen.  Have thin, pencil-like stools.  Get help right away if:  You have black or tarry stools. Summary  A fecal impaction is a large, firm amount of stool (feces) that will not pass out of the body. A fecal impaction can block the large intestine and cause significant problems.  This condition may be caused by anything that slows down bowel movements.  This condition may be treated by having your health care provider remove the stool using a gloved  finger, taking medicine, or using a suppository or enema in the rectum to soften the stool.  Develop bowel habits and eat foods that are high in fiber, such as beans, whole grains, and fresh fruits and vegetables.  Contact a health care provider if you continue to have problems. The problems may include not being able to go to the bathroom and having long-term constipation. This information is not intended to replace advice given to you by your health care provider. Make sure you discuss any questions you have with your health care provider. Document Revised: 07/14/2018 Document Reviewed: 07/14/2018 Elsevier Patient Education  [place].

## 2019-03-19 NOTE — Assessment & Plan Note (Signed)
Evidence of this on rectal exam. He did not tolerate disimpaction. Continues passing gas, without abd pain, no clinical concern for obstruction at this time - ok to try continue home treatment for now. He had not yet tried miralax - I recommend he start daily miralax and increase water intake to soften stool. RTC 1 wk close f/u.

## 2019-03-22 ENCOUNTER — Ambulatory Visit: Payer: PPO | Admitting: Family Medicine

## 2019-03-26 ENCOUNTER — Telehealth: Payer: Self-pay | Admitting: Family Medicine

## 2019-03-26 ENCOUNTER — Encounter: Payer: Self-pay | Admitting: Family Medicine

## 2019-03-26 ENCOUNTER — Other Ambulatory Visit: Payer: Self-pay

## 2019-03-26 ENCOUNTER — Ambulatory Visit (INDEPENDENT_AMBULATORY_CARE_PROVIDER_SITE_OTHER): Payer: PPO | Admitting: Family Medicine

## 2019-03-26 VITALS — BP 90/68 | HR 68 | Temp 98.1°F | Ht 72.0 in | Wt 210.1 lb

## 2019-03-26 DIAGNOSIS — K5900 Constipation, unspecified: Secondary | ICD-10-CM

## 2019-03-26 DIAGNOSIS — R634 Abnormal weight loss: Secondary | ICD-10-CM

## 2019-03-26 DIAGNOSIS — K5641 Fecal impaction: Secondary | ICD-10-CM

## 2019-03-26 NOTE — Assessment & Plan Note (Signed)
Resolved

## 2019-03-26 NOTE — Patient Instructions (Addendum)
Pepto bismol is more for indigestion/diarrhea - don't take for now.  I'm glad you're doing better. Continue miralax daily - it is helping.  Watch weight, let me know if dropping below 210lbs to consider GI evaluation.

## 2019-03-26 NOTE — Telephone Encounter (Signed)
plz call - I forgot to recheck BP before he left. Can he check BP at home? If staying [id number] systolic, rec stop hctz and monitor BP over the next week, call us with readings in 1 week off hctz.

## 2019-03-26 NOTE — Telephone Encounter (Signed)
Spoke with pt/pt's wife, [name], relaying Dr. Synthia Innocent message.  Verbalizes understanding.

## 2019-03-26 NOTE — Assessment & Plan Note (Addendum)
3 lb weight gain noted with improvement in constipation Discussed importance of letting us know if weight loss again noted. Offered GI eval - he declines for now.

## 2019-03-26 NOTE — Assessment & Plan Note (Signed)
Significant improvement since starting daily miralax - advised continue this. Reviewed goal 1 soft stool/day. He is having 1 soft stool every 3-4 days, but feels he empties well now.

## 2019-03-26 NOTE — Progress Notes (Signed)
This visit was conducted in person.  BP 90/68 (BP Location: Left Arm, Patient Position: Sitting, Cuff Size: Normal)   Pulse 68   Temp 98.1 F (36.7 C) (Temporal)   Ht 6' (1.829 m)   Wt 210 lb 1 oz (95.3 kg)   SpO2 96%   BMI 28.49 kg/m   BP Readings from Last 3 Encounters:  03/26/19 90/68  03/19/19 112/68  02/26/19 118/66    CC: 1 wk f/u visit for constipation Subjective:    Patient ID: Evan Morales, male    DOB: July 22, 1945, 74 y.o.   MRN: [id number]  HPI: Evan Morales is a 74 y.o. male presenting on 03/26/2019 for Follow-up (Here for 1 wk f/u.  States constipation has improved. )   See prior note for details. Seen here last week with fecal impaction, did not tolerate manual disimpaction well.   Since last week he has taken metformin 1 capful daily - now up to twice daily. Significant benefit with miralax - still only going once every 3-4 days.   3 lb weight gain noted with improvement in constipation.  Wt Readings from Last 3 Encounters:  03/26/19 210 lb 1 oz (95.3 kg)  03/19/19 207 lb 4 oz (94 kg)  02/26/19 213 lb 6 oz (96.8 kg)  219 lbs (07/2018) 228 lbs (07/2017)  iFOB negative 2017 and 08/2018.      Relevant past medical, surgical, family and social history reviewed and updated as indicated. Interim medical history since our last visit reviewed. Allergies and medications reviewed and updated. Outpatient Medications Prior to Visit  Medication Sig Dispense Refill  . atorvastatin (LIPITOR) 40 MG tablet TAKE 1 TABLET BY MOUTH EVERY DAY IN THE EVENING 90 tablet 2  . brimonidine (ALPHAGAN) 0.2 % ophthalmic solution PLACE 1 DROP INTO THE RIGHT EYE 2 TIMES DAILY.    Marland Kitchen clopidogrel (PLAVIX) 75 MG tablet TAKE 1 TABLET BY MOUTH EVERY DAY 90 tablet 1  . Cyanocobalamin (B-12) 1000 MCG SUBL Place 1 tablet under the tongue daily. 30 each   . diclofenac sodium (VOLTAREN) 1 % GEL Apply 4 g topically 4 (four) times daily as needed. 500 g 6  . dorzolamide-timolol (COSOPT)  22.3-6.8 MG/ML ophthalmic solution Place 1 drop into the right eye 2 (two) times daily.    . hydrochlorothiazide (MICROZIDE) 12.5 MG capsule TAKE 1 CAPSULE BY MOUTH EVERY DAY 90 capsule 2  . polyethylene glycol powder (GLYCOLAX/MIRALAX) 17 GM/SCOOP powder Take 1/2 to a whole capful mixed in 8 oz fluid as needed for constipation, hold for diarrhea. 255 g 0  . psyllium (METAMUCIL SMOOTH TEXTURE) 58.6 % powder Take 1 packet by mouth daily.    . quinapril (ACCUPRIL) 20 MG tablet TAKE 1 TABLET BY MOUTH EVERY DAY 90 tablet 3   No facility-administered medications prior to visit.     Per HPI unless specifically indicated in ROS section below Review of Systems Objective:    BP 90/68 (BP Location: Left Arm, Patient Position: Sitting, Cuff Size: Normal)   Pulse 68   Temp 98.1 F (36.7 C) (Temporal)   Ht 6' (1.829 m)   Wt 210 lb 1 oz (95.3 kg)   SpO2 96%   BMI 28.49 kg/m   Wt Readings from Last 3 Encounters:  03/26/19 210 lb 1 oz (95.3 kg)  03/19/19 207 lb 4 oz (94 kg)  02/26/19 213 lb 6 oz (96.8 kg)    Physical Exam Vitals and nursing note reviewed.  Constitutional:  Appearance: Normal appearance. He is not ill-appearing.  Abdominal:     General: Abdomen is flat. Bowel sounds are normal. There is no distension.     Palpations: Abdomen is soft. There is no mass.     Tenderness: There is no abdominal tenderness. There is no guarding or rebound.     Hernia: No hernia is present.  Genitourinary:    Prostate: Enlarged.     Rectum: Normal. Guaiac result negative. No mass, tenderness, anal fissure, external hemorrhoid or internal hemorrhoid. Normal anal tone.     Comments: No stool in rectal vault noted Neurological:     Mental Status: He is alert.  Psychiatric:        Mood and Affect: Mood normal.       Results for orders placed or performed in visit on 09/01/18  Fecal Occult Blood, Guaiac  Result Value Ref Range   Fecal Occult Blood Negative    Hemoccult negative  today Assessment & Plan:  This visit occurred during the SARS-CoV-2 public health emergency.  Safety protocols were in place, including screening questions prior to the visit, additional usage of staff PPE, and extensive cleaning of exam room while observing appropriate contact time as indicated for disinfecting solutions.   Problem List Items Addressed This Visit    Weight loss, unintentional    3 lb weight gain noted with improvement in constipation Discussed importance of letting us know if weight loss again noted. Offered GI eval - he declines for now.       Fecal impaction in rectum (HCC)    Resolved.       Constipation - Primary    Significant improvement since starting daily miralax - advised continue this. Reviewed goal 1 soft stool/day. He is having 1 soft stool every 3-4 days, but feels he empties well now.           No orders of the defined types were placed in this encounter.  No orders of the defined types were placed in this encounter.   Patient Instructions  Pepto bismol is more for indigestion/diarrhea - don't take for now.  I'm glad you're doing better. Continue miralax daily - it is helping.  Watch weight, let me know if dropping below 210lbs to consider GI evaluation.    Follow up plan: No follow-ups on file.  Ria Bush, MD

## 2019-04-26 DIAGNOSIS — H26492 Other secondary cataract, left eye: Secondary | ICD-10-CM | POA: Diagnosis not present

## 2019-04-26 DIAGNOSIS — H4031X3 Glaucoma secondary to eye trauma, right eye, severe stage: Secondary | ICD-10-CM | POA: Diagnosis not present

## 2019-04-26 DIAGNOSIS — H2511 Age-related nuclear cataract, right eye: Secondary | ICD-10-CM | POA: Diagnosis not present

## 2019-04-26 DIAGNOSIS — H524 Presbyopia: Secondary | ICD-10-CM | POA: Diagnosis not present

## 2019-04-26 DIAGNOSIS — H5202 Hypermetropia, left eye: Secondary | ICD-10-CM | POA: Diagnosis not present

## 2019-04-26 DIAGNOSIS — Z961 Presence of intraocular lens: Secondary | ICD-10-CM | POA: Diagnosis not present

## 2019-04-26 DIAGNOSIS — H52202 Unspecified astigmatism, left eye: Secondary | ICD-10-CM | POA: Diagnosis not present

## 2019-05-27 ENCOUNTER — Other Ambulatory Visit: Payer: Self-pay | Admitting: Family Medicine

## 2019-05-28 ENCOUNTER — Other Ambulatory Visit: Payer: Self-pay | Admitting: Family Medicine

## 2019-05-28 NOTE — Telephone Encounter (Signed)
Plavix Last filled:  02/02/19, #90 Last OV:  03/26/19, constipation f/u Next OV:  none

## 2019-05-28 NOTE — Telephone Encounter (Signed)
Last filled on 03/19/19 #255 g with 0 refills.  LOV 03/26/19 follow up constipation. No future appointments.

## 2019-06-07 ENCOUNTER — Ambulatory Visit (INDEPENDENT_AMBULATORY_CARE_PROVIDER_SITE_OTHER): Payer: PPO | Admitting: Family Medicine

## 2019-06-07 ENCOUNTER — Encounter: Payer: Self-pay | Admitting: Family Medicine

## 2019-06-07 ENCOUNTER — Other Ambulatory Visit: Payer: Self-pay

## 2019-06-07 VITALS — BP 110/68 | HR 64 | Temp 98.0°F | Ht 72.0 in | Wt 211.0 lb

## 2019-06-07 DIAGNOSIS — R2232 Localized swelling, mass and lump, left upper limb: Secondary | ICD-10-CM

## 2019-06-07 NOTE — Patient Instructions (Signed)
Let's refer you to skin doctor for evaluation of spot on left hand.  We will call you for an appointment.

## 2019-06-07 NOTE — Assessment & Plan Note (Signed)
Skin lesion to L dorsal hand with surrounding ecchymosis, r/o SCC - will refer to derm for further evaluation. Pt agrees with plan.

## 2019-06-07 NOTE — Progress Notes (Addendum)
This visit was conducted in person.  BP 110/68 (BP Location: Left Arm, Patient Position: Sitting, Cuff Size: Normal)   Pulse 64   Temp 98 F (36.7 C) (Temporal)   Ht 6' (1.829 m)   Wt 211 lb (95.7 kg)   SpO2 97%   BMI 28.62 kg/m    CC: skin lesion Subjective:    Patient ID: Evan Morales, male    DOB: 1945-06-12, 74 y.o.   MRN: [id number]  HPI: Evan Morales is a 74 y.o. male presenting on 06/07/2019 for Skin Lesion (C/o spot on posterior left hand.  Area has red circle around it and white in center.  Noticed spot about 2 mos ago.  Noticed redness about 2 days ago. )   Rough growth to R dorsal hand for the past 2 months. Over weekend noticed some redness around area. No pain or itching. No known trauma to area.   COVID vaccine - completed. Will let us know dates.     Relevant past medical, surgical, family and social history reviewed and updated as indicated. Interim medical history since our last visit reviewed. Allergies and medications reviewed and updated. Outpatient Medications Prior to Visit  Medication Sig Dispense Refill  . atorvastatin (LIPITOR) 40 MG tablet TAKE 1 TABLET BY MOUTH EVERY DAY IN THE EVENING 90 tablet 0  . brimonidine (ALPHAGAN) 0.2 % ophthalmic solution PLACE 1 DROP INTO THE RIGHT EYE 2 TIMES DAILY.    Marland Kitchen clopidogrel (PLAVIX) 75 MG tablet TAKE 1 TABLET BY MOUTH EVERY DAY 90 tablet 1  . Cyanocobalamin (B-12) 1000 MCG SUBL Place 1 tablet under the tongue daily. 30 each   . diclofenac sodium (VOLTAREN) 1 % GEL Apply 4 g topically 4 (four) times daily as needed. 500 g 6  . dorzolamide-timolol (COSOPT) 22.3-6.8 MG/ML ophthalmic solution Place 1 drop into the right eye 2 (two) times daily.    . hydrochlorothiazide (MICROZIDE) 12.5 MG capsule TAKE 1 CAPSULE BY MOUTH EVERY DAY 90 capsule 2  . polyethylene glycol powder (GAVILAX) 17 GM/SCOOP powder TAKE 1/2 TO A WHOLE CAPFUL MIXED IN 8 OZ FLUID AS NEEDED FOR CONSTIPATION, HOLD FOR DIARRHEA. 255 g 3  .  psyllium (METAMUCIL SMOOTH TEXTURE) 58.6 % powder Take 1 packet by mouth daily.    . quinapril (ACCUPRIL) 20 MG tablet TAKE 1 TABLET BY MOUTH EVERY DAY 90 tablet 3   No facility-administered medications prior to visit.     Per HPI unless specifically indicated in ROS section below Review of Systems Objective:  BP 110/68 (BP Location: Left Arm, Patient Position: Sitting, Cuff Size: Normal)   Pulse 64   Temp 98 F (36.7 C) (Temporal)   Ht 6' (1.829 m)   Wt 211 lb (95.7 kg)   SpO2 97%   BMI 28.62 kg/m   Wt Readings from Last 3 Encounters:  06/07/19 211 lb (95.7 kg)  03/26/19 210 lb 1 oz (95.3 kg)  03/19/19 207 lb 4 oz (94 kg)      Physical Exam Vitals and nursing note reviewed.  Constitutional:      Appearance: Normal appearance. He is not ill-appearing.  Skin:    General: Skin is warm and dry.     Findings: Lesion present.     Comments: Small hyperkeratotic growth to L dorsal hand with slight surrounding ecchymosis  Neurological:     Mental Status: He is alert.       Results for orders placed or performed in visit on 09/01/18  Fecal Occult  Blood, Guaiac  Result Value Ref Range   Fecal Occult Blood Negative    Assessment & Plan:  This visit occurred during the SARS-CoV-2 public health emergency.  Safety protocols were in place, including screening questions prior to the visit, additional usage of staff PPE, and extensive cleaning of exam room while observing appropriate contact time as indicated for disinfecting solutions.   Problem List Items Addressed This Visit    Mass of skin of left hand - Primary    Skin lesion to L dorsal hand with surrounding ecchymosis, r/o SCC - will refer to derm for further evaluation. Pt agrees with plan.       Relevant Orders   Ambulatory referral to Dermatology       No orders of the defined types were placed in this encounter.  Orders Placed This Encounter  Procedures  . Ambulatory referral to Dermatology    Referral Priority:    Routine    Referral Type:   Consultation    Referral Reason:   Specialty Services Required    Requested Specialty:   Dermatology    Number of Visits Requested:   1    Patient Instructions  Let's refer you to skin doctor for evaluation of spot on left hand.  We will call you for an appointment.    Follow up plan: No follow-ups on file.  Ria Bush, MD

## 2019-06-11 DIAGNOSIS — X32XXXA Exposure to sunlight, initial encounter: Secondary | ICD-10-CM | POA: Diagnosis not present

## 2019-06-11 DIAGNOSIS — L821 Other seborrheic keratosis: Secondary | ICD-10-CM | POA: Diagnosis not present

## 2019-06-11 DIAGNOSIS — L570 Actinic keratosis: Secondary | ICD-10-CM | POA: Diagnosis not present

## 2019-07-29 ENCOUNTER — Telehealth: Payer: Self-pay | Admitting: Family Medicine

## 2019-07-29 MED ORDER — ATORVASTATIN CALCIUM 40 MG PO TABS: ORAL_TABLET | ORAL | 0 refills | Status: DC

## 2019-07-29 NOTE — Telephone Encounter (Signed)
E-scribed.  Plz schedule wellness, cpe and lab visits.

## 2019-07-29 NOTE — Telephone Encounter (Signed)
Medication Refill - Medication: atorvastatin (LIPITOR) 40 MG tablet  Has the patient contacted their pharmacy? No, patient came into office to request as he is out.   Preferred Pharmacy (with phone number or street name):  [id number] - [place], [place] - [address] Phone:  [phone]  Fax:  [phone]     Patient is requesting that he receive a call when it is sent to pharmacy. Please advise.

## 2019-07-30 NOTE — Telephone Encounter (Signed)
Called and patient and got him scheduled for AWV, CPE and labs.

## 2019-08-19 ENCOUNTER — Other Ambulatory Visit: Payer: Self-pay | Admitting: Family Medicine

## 2019-08-26 ENCOUNTER — Other Ambulatory Visit: Payer: Self-pay | Admitting: Family Medicine

## 2019-08-26 DIAGNOSIS — R7303 Prediabetes: Secondary | ICD-10-CM

## 2019-08-26 DIAGNOSIS — E538 Deficiency of other specified B group vitamins: Secondary | ICD-10-CM

## 2019-08-26 DIAGNOSIS — E7800 Pure hypercholesterolemia, unspecified: Secondary | ICD-10-CM

## 2019-08-27 ENCOUNTER — Ambulatory Visit (INDEPENDENT_AMBULATORY_CARE_PROVIDER_SITE_OTHER): Payer: PPO

## 2019-08-27 ENCOUNTER — Other Ambulatory Visit (INDEPENDENT_AMBULATORY_CARE_PROVIDER_SITE_OTHER): Payer: PPO

## 2019-08-27 ENCOUNTER — Other Ambulatory Visit: Payer: Self-pay

## 2019-08-27 DIAGNOSIS — E538 Deficiency of other specified B group vitamins: Secondary | ICD-10-CM

## 2019-08-27 DIAGNOSIS — Z0000 Encounter for general adult medical examination without abnormal findings: Secondary | ICD-10-CM | POA: Diagnosis not present

## 2019-08-27 DIAGNOSIS — R7303 Prediabetes: Secondary | ICD-10-CM

## 2019-08-27 DIAGNOSIS — E7800 Pure hypercholesterolemia, unspecified: Secondary | ICD-10-CM | POA: Diagnosis not present

## 2019-08-27 LAB — LIPID PANEL
Cholesterol: 78 mg/dL (ref 0–200)
HDL: 39.8 mg/dL (ref >39.00)
LDL Cholesterol: 26 mg/dL (ref 0–99)
NonHDL: 38.49
Total CHOL/HDL Ratio: 2
Triglycerides: 62 mg/dL (ref 0.0–149.0)
VLDL: 12.4 mg/dL (ref 0.0–40.0)

## 2019-08-27 LAB — COMPREHENSIVE METABOLIC PANEL
ALT: 17 U/L (ref 0–53)
AST: 16 U/L (ref 0–37)
Albumin: 4.3 g/dL (ref 3.5–5.2)
Alkaline Phosphatase: 56 U/L (ref 39–117)
BUN: 8 mg/dL (ref 6–23)
CO2: 29 mEq/L (ref 19–32)
Calcium: 9.5 mg/dL (ref 8.4–10.5)
Chloride: 101 mEq/L (ref 96–112)
Creatinine, Ser: 0.76 mg/dL (ref 0.40–1.50)
GFR: 100.06 mL/min (ref >60.00)
Glucose, Bld: 101 mg/dL — ABNORMAL HIGH (ref 70–99)
Potassium: 4.3 mEq/L (ref 3.5–5.1)
Sodium: 136 mEq/L (ref 135–145)
Total Bilirubin: 2.2 mg/dL — ABNORMAL HIGH (ref 0.2–1.2)
Total Protein: 6.7 g/dL (ref 6.0–8.3)

## 2019-08-27 LAB — VITAMIN B12: Vitamin B-12: 192 pg/mL — ABNORMAL LOW (ref 211–911)

## 2019-08-27 LAB — HEMOGLOBIN A1C: Hgb A1c MFr Bld: 5.9 % (ref 4.6–6.5)

## 2019-08-27 NOTE — Progress Notes (Addendum)
Subjective:   Evan Morales is a 74 y.o. male who presents for Medicare Annual/Subsequent preventive examination.  Review of Systems: N/A      I connected with the patient today by telephone and verified that I am speaking with the correct person using two identifiers. Location patient: home Location nurse: work Persons participating in the virtual visit: patient, Marine scientist.   I discussed the limitations, risks, security and privacy concerns of performing an evaluation and management service by telephone and the availability of in person appointments. I also discussed with the patient that there may be a patient responsible charge related to this service. The patient expressed understanding and verbally consented to this telephonic visit.    Interactive audio and video telecommunications were attempted between this nurse and patient, however failed, due to patient having technical difficulties OR patient did not have access to video capability.  We continued and completed visit with audio only.     Cardiac Risk Factors include: advanced age (>75men, >33 women);male gender;hypertension;Other (see comment), Risk factor comments: hypercholesterolemia     Objective:    Today's Vitals   There is no height or weight on file to calculate BMI.  Advanced Directives 08/27/2019 08/06/2017 10/01/2016 10/01/2016 09/30/2016  Does Patient Have a Medical Advance Directive? Yes Yes - - Yes  Type of Advance Directive [place];Living will [place];Living will Healthcare Power of [place] of [place] in Chart? No - copy requested Yes No - copy requested - No - copy requested  Would patient like information on creating a medical advance directive? - - - - Yes (ED - Information included in AVS)    Current Medications (verified) Outpatient Encounter Medications as of 08/27/2019    Medication Sig  . atorvastatin (LIPITOR) 40 MG tablet TAKE 1 TABLET BY MOUTH EVERY DAY IN THE EVENING  . brimonidine (ALPHAGAN) 0.2 % ophthalmic solution PLACE 1 DROP INTO THE RIGHT EYE 2 TIMES DAILY.  Marland Kitchen clopidogrel (PLAVIX) 75 MG tablet TAKE 1 TABLET BY MOUTH EVERY DAY  . Cyanocobalamin (B-12) 1000 MCG SUBL Place 1 tablet under the tongue daily.  . diclofenac sodium (VOLTAREN) 1 % GEL Apply 4 g topically 4 (four) times daily as needed.  . dorzolamide-timolol (COSOPT) 22.3-6.8 MG/ML ophthalmic solution Place 1 drop into the right eye 2 (two) times daily.  . hydrochlorothiazide (MICROZIDE) 12.5 MG capsule TAKE 1 CAPSULE BY MOUTH EVERY DAY  . polyethylene glycol powder (GAVILAX) 17 GM/SCOOP powder TAKE 1/2 TO A WHOLE CAPFUL MIXED IN 8 OZ FLUID AS NEEDED FOR CONSTIPATION, HOLD FOR DIARRHEA.  Marland Kitchen psyllium (METAMUCIL SMOOTH TEXTURE) 58.6 % powder Take 1 packet by mouth daily.  . quinapril (ACCUPRIL) 20 MG tablet TAKE 1 TABLET BY MOUTH EVERY DAY   No facility-administered encounter medications on file as of 08/27/2019.    Allergies (verified) Patient has no known allergies.   History: Past Medical History:  Diagnosis Date  . 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  . 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 No hemorrhage. Residual numbness R index and middle finger and R periorally, residual memory loss, worsened L vision  . Degenerative arthritis   . Dyslipidemia   . Dysthymia   . History of colon polyps 05/15/2005   tubular adenomas polyps  . Hypertension   . Legally blind   .  Pre-diabetes    Past Surgical History:  Procedure Laterality Date  . CATARACT EXTRACTION  2013   left  . 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 ESOPHAGOGASTRODUODENOSCOPY  05/24/2005   mild erythema in antrum  . 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 Cancer Mother         male  . Alzheimer's disease Mother   . Glaucoma Father   . Coronary artery disease Neg Hx   . Stroke Neg Hx   . Diabetes Neg Hx   . Colon cancer Neg Hx    Social History   Socioeconomic History  . Marital status: Married    Spouse name: Not on file  . Number of children: 2  . Years of education: Not on file  . Highest education level: Not on file  Occupational History  . Occupation: Development worker, international aid: OTHER  Tobacco Use  . Smoking status: Never Smoker  . Smokeless tobacco: Former Systems developer    Types: Secondary school teacher  . Vaping Use: Never used  Substance and Sexual Activity  . Alcohol use: No    Alcohol/week: 0.0 standard drinks  . Drug use: No  . Sexual activity: Not Currently  Other Topics Concern  . Not on file  Social History Narrative   Caffeine: 2-3 cups coffee/day   Lives with wife.  (grown children)   Occupation: retired, was Occupational hygienist   Activity: golf, fishing   Diet: good water, fruits/vegetables   Social Determinants of Radio broadcast assistant Strain: Low Risk   . Difficulty of Paying Living Expenses: Not hard at all  Food Insecurity: No Food Insecurity  . Worried About Charity fundraiser in the Last Year: Never true  . Ran Out of Food in the Last Year: Never true  Transportation Needs: No Transportation Needs  . Lack of Transportation (Medical): No  . Lack of Transportation (Non-Medical): No  Physical Activity: Insufficiently Active  . Days of Exercise per Week: 7 days  . Minutes of Exercise per Session: 10 min  Stress: No Stress Concern Present  . Feeling of Stress : Not at all  Social Connections:   . Frequency of Communication with Friends and Family:   . Frequency of Social Gatherings with Friends and Family:   . Attends Religious Services:   . Active Member of Clubs or Organizations:   . Attends Archivist Meetings:   Marland Kitchen Marital Status:     Tobacco Counseling Counseling given: Not  Answered   Clinical Intake:  Pre-visit preparation completed: Yes  Pain : No/denies pain     Nutritional Risks: None Diabetes: No  How often do you need to have someone help you when you read instructions, pamphlets, or other written materials from your doctor or pharmacy?: 1 - Never What is the last grade level you completed in school?: post graduate  Diabetic: No Nutrition Risk Assessment:  Has the patient had any N/V/D within the last 2 months?  No  Does the patient have any non-healing wounds?  No  Has the patient had any unintentional weight loss or weight gain?  No   Diabetes:  Is the patient diabetic?  No  If diabetic, was a CBG obtained today?  No  Did the patient bring in their glucometer from home?  No  How often do you monitor your CBG's? N/A.   Financial Strains  and Diabetes Management:  Are you having any financial strains with the device, your supplies or your medication? N/A.  Does the patient want to be seen by Chronic Care Management for management of their diabetes?  N/A Would the patient like to be referred to a Nutritionist or for Diabetic Management?  N/A     Interpreter Needed?: No  Information entered by :: CJohnson, LPN   Activities of Daily Living In your present state of health, do you have any difficulty performing the following activities: 08/27/2019  Hearing? N  Vision? Y  Comment does not see well, had transplant years ago  Difficulty concentrating or making decisions? N  Walking or climbing stairs? N  Dressing or bathing? N  Doing errands, shopping? N  Preparing Food and eating ? N  Using the Toilet? N  In the past six months, have you accidently leaked urine? N  Do you have problems with loss of bowel control? N  Managing your Medications? N  Managing your Finances? N  Housekeeping or managing your Housekeeping? N  Some recent data might be hidden    Patient Care Team: Ria Bush, MD as PCP - General (Family  Medicine)  Indicate any recent Medical Services you may have received from other than [facility] providers in the past year (date may be approximate).     Assessment:   This is a routine wellness examination for [place].  Hearing/Vision screen  Hearing Screening   125Hz  250Hz  500Hz  1000Hz  2000Hz  3000Hz  4000Hz  6000Hz  8000Hz   Right ear:           Left ear:           Vision Screening Comments: Patient gets annual eye exams  Dietary issues and exercise activities discussed: Current Exercise Habits: Home exercise routine, Type of exercise: walking, Time (Minutes): 15, Frequency (Times/Week): 7, Weekly Exercise (Minutes/Week): 105, Intensity: Moderate, Exercise limited by: None identified  Goals    . Increase physical activity     Starting 08/06/2017, I will continue to walk at least 30 minutes daily.     . Patient Stated     08/27/2019, I will continue to walk everyday for about 15 minutes.       Depression Screen PHQ 2/9 Scores 08/27/2019 08/25/2018 08/06/2017 08/05/2016 07/27/2015 07/26/2014 07/26/2014  PHQ - 2 Score 0 0 0 0 1 0 0  PHQ- 9 Score 0 - 0 - - - -    Fall Risk Fall Risk  08/27/2019 08/25/2018 08/06/2017 11/19/2016 08/05/2016  Falls in the past year? 0 0 No No No  Comment - - - - -  Number falls in past yr: 0 - - - -  Injury with Fall? 0 - - - -  Risk for fall due to : Medication side effect - - - -  Follow up Falls evaluation completed;Falls prevention discussed - - - -    Any stairs in or around the home? Yes  If so, are there any without handrails? No  Home free of loose throw rugs in walkways, pet beds, electrical cords, etc? Yes  Adequate lighting in your home to reduce risk of falls? Yes   ASSISTIVE DEVICES UTILIZED TO PREVENT FALLS:  Life alert? No  Use of a cane, walker or w/c? Yes  Grab bars in the bathroom? No  Shower chair or bench in shower? No  Elevated toilet seat or a handicapped toilet? No   TIMED UP AND GO:  Was the test performed? N/A, telephonic visit .  Cognitive Function: MMSE - Mini Mental State Exam 08/27/2019 08/06/2017  Orientation to time 5 5  Orientation to Place 5 5  Registration 3 3  Attention/ Calculation 5 0  Recall 3 3  Language- name 2 objects - 0  Language- repeat 1 1  Language- follow 3 step command - 3  Language- read & follow direction - 0  Write a sentence - 0  Copy design - 0  Total score - 20  Mini Cog  Mini-Cog screen was completed. Maximum score is 22. A value of 0 denotes this part of the MMSE was not completed or the patient failed this part of the Mini-Cog screening.       Immunizations Immunization History  Administered Date(s) Administered  . Fluad Quad(high Dose 65+) 02/26/2019  . Influenza Split 01/02/2011  . Influenza,inj,Quad PF,6+ Mos 02/23/2013, 11/26/2013, 10/20/2014, 01/30/2016, 12/03/2016  . PFIZER SARS-COV-2 Vaccination 03/11/2019, 04/05/2019  . Pneumococcal Conjugate-13 07/27/2015  . Pneumococcal Polysaccharide-23 07/10/2011  . Td 05/16/2009  . Zoster 07/13/2012    TDAP status: Due, Education has been provided regarding the importance of this vaccine. Advised may receive this vaccine at local pharmacy or Health Dept. Aware to provide a copy of the vaccination record if obtained from local pharmacy or Health Dept. Verbalized acceptance and understanding. Flu Vaccine status: Up to date Pneumococcal vaccine status: Up to date Covid-19 vaccine status: Completed vaccines  Qualifies for Shingles Vaccine? Yes   Zostavax completed Yes   Shingrix Completed?: No.    Education has been provided regarding the importance of this vaccine. Patient has been advised to call insurance company to determine out of pocket expense if they have not yet received this vaccine. Advised may also receive vaccine at local pharmacy or Health Dept. Verbalized acceptance and understanding.  Screening Tests Health Maintenance  Topic Date Due  . DTAP VACCINES (1) 01/29/1946  . DTaP/Tdap/Td (2 - Tdap) 05/17/2019   . TETANUS/TDAP  08/27/2023 (Originally 05/17/2019)  . INFLUENZA VACCINE  08/29/2019  . COLON CANCER SCREENING ANNUAL FOBT  09/01/2019  . COVID-19 Vaccine  Completed  . Hepatitis C Screening  Completed  . PNA vac Low Risk Adult  Completed  . COLONOSCOPY  Discontinued    Health Maintenance  Health Maintenance Due  Topic Date Due  . DTAP VACCINES (1) 01/29/1946  . DTaP/Tdap/Td (2 - Tdap) 05/17/2019    Colorectal cancer screening: Completed FOBT 09/01/2018. Repeat every 1 years  Lung Cancer Screening: (Low Dose CT Chest recommended if Age 44-80 years, 30 pack-year currently smoking OR have quit w/in 15years.) does not qualify.     Additional Screening:  Hepatitis C Screening: does qualify; Completed 07/27/2015  Vision Screening: Recommended annual ophthalmology exams for early detection of glaucoma and other disorders of the eye. Is the patient up to date with their annual eye exam?  Yes  Who is the provider or what is the name of the office in which the patient attends annual eye exams? Dr. Edilia Bo If pt is not established with a provider, would they like to be referred to a provider to establish care? No .   Dental Screening: Recommended annual dental exams for proper oral hygiene  Community Resource Referral / Chronic Care Management: CRR required this visit?  No   CCM required this visit?  No      Plan:     I have personally reviewed and noted the following in the patient's chart:   . Medical and social history . Use of alcohol, tobacco or  illicit drugs  . Current medications and supplements . Functional ability and status . Nutritional status . Physical activity . Advanced directives . List of other physicians . Hospitalizations, surgeries, and ER visits in previous 12 months . Vitals . Screenings to include cognitive, depression, and falls . Referrals and appointments  In addition, I have reviewed and discussed with patient certain preventive protocols, quality  metrics, and best practice recommendations. A written personalized care plan for preventive services as well as general preventive health recommendations were provided to patient.   Due to this being a telephonic visit, the after visit summary with patients personalized plan was offered to patient via mail or my-chart. Patient preferred to pick up at office at next visit.   Andrez Grime, LPN   [id number]

## 2019-08-27 NOTE — Patient Instructions (Signed)
Evan Morales , Thank you for taking time to come for your Medicare Wellness Visit. I appreciate your ongoing commitment to your health goals. Please review the following plan we discussed and let me know if I can assist you in the future.   Screening recommendations/referrals: Colonoscopy: FOBT completed 09/01/2018, due 08/2019 Recommended yearly ophthalmology/optometry visit for glaucoma screening and checkup Recommended yearly dental visit for hygiene and checkup  Vaccinations: Influenza vaccine: Up to date, completed 02/26/2019, due 08/2019 Pneumococcal vaccine: Completed series Tdap vaccine:decline- insurance/financial Shingles vaccine: due, check with your insurance regarding coverage options   Covid-19: Completed series  Advanced directives: Please bring a copy of your POA (Power of Attorney) and/or Living Will to your next appointment.   Conditions/risks identified: hypertension, hypercholesterolemia  Next appointment: Follow up in one year for your annual wellness visit.   Preventive Care 28 Years and Older, Male Preventive care refers to lifestyle choices and visits with your health care provider that can promote health and wellness. What does preventive care include?  A yearly physical exam. This is also called an annual well check.  Dental exams once or twice a year.  Routine eye exams. Ask your health care provider how often you should have your eyes checked.  Personal lifestyle choices, including:  Daily care of your teeth and gums.  Regular physical activity.  Eating a healthy diet.  Avoiding tobacco and drug use.  Limiting alcohol use.  Practicing safe sex.  Taking low doses of aspirin every day.  Taking vitamin and mineral supplements as recommended by your health care provider. What happens during an annual well check? The services and screenings done by your health care provider during your annual well check will depend on your age, overall health,  lifestyle risk factors, and family history of disease. Counseling  Your health care provider may ask you questions about your:  Alcohol use.  Tobacco use.  Drug use.  Emotional well-being.  Home and relationship well-being.  Sexual activity.  Eating habits.  History of falls.  Memory and ability to understand (cognition).  Work and work Statistician. Screening  You may have the following tests or measurements:  Height, weight, and BMI.  Blood pressure.  Lipid and cholesterol levels. These may be checked every 5 years, or more frequently if you are over 71 years old.  Skin check.  Lung cancer screening. You may have this screening every year starting at age 80 if you have a 30-pack-year history of smoking and currently smoke or have quit within the past 15 years.  Fecal occult blood test (FOBT) of the stool. You may have this test every year starting at age 68.  Flexible sigmoidoscopy or colonoscopy. You may have a sigmoidoscopy every 5 years or a colonoscopy every 10 years starting at age 12.  Prostate cancer screening. Recommendations will vary depending on your family history and other risks.  Hepatitis C blood test.  Hepatitis B blood test.  Sexually transmitted disease (STD) testing.  Diabetes screening. This is done by checking your blood sugar (glucose) after you have not eaten for a while (fasting). You may have this done every 1-3 years.  Abdominal aortic aneurysm (AAA) screening. You may need this if you are a current or former smoker.  Osteoporosis. You may be screened starting at age 56 if you are at high risk. Talk with your health care provider about your test results, treatment options, and if necessary, the need for more tests. Vaccines  Your health care provider may  recommend certain vaccines, such as:  Influenza vaccine. This is recommended every year.  Tetanus, diphtheria, and acellular pertussis (Tdap, Td) vaccine. You may need a Td booster  every 10 years.  Zoster vaccine. You may need this after age 55.  Pneumococcal 13-valent conjugate (PCV13) vaccine. One dose is recommended after age 84.  Pneumococcal polysaccharide (PPSV23) vaccine. One dose is recommended after age 40. Talk to your health care provider about which screenings and vaccines you need and how often you need them. This information is not intended to replace advice given to you by your health care provider. Make sure you discuss any questions you have with your health care provider. Document Released: 02/10/2015 Document Revised: 10/04/2015 Document Reviewed: 11/15/2014 Elsevier Interactive Patient Education  2017 [place] Prevention in the Home Falls can cause injuries. They can happen to people of all ages. There are many things you can do to make your home safe and to help prevent falls. What can I do on the outside of my home?  Regularly fix the edges of walkways and driveways and fix any cracks.  Remove anything that might make you trip as you walk through a door, such as a raised step or threshold.  Trim any bushes or trees on the path to your home.  Use bright outdoor lighting.  Clear any walking paths of anything that might make someone trip, such as rocks or tools.  Regularly check to see if handrails are loose or broken. Make sure that both sides of any steps have handrails.  Any raised decks and porches should have guardrails on the edges.  Have any leaves, snow, or ice cleared regularly.  Use sand or salt on walking paths during winter.  Clean up any spills in your garage right away. This includes oil or grease spills. What can I do in the bathroom?  Use night lights.  Install grab bars by the toilet and in the tub and shower. Do not use towel bars as grab bars.  Use non-skid mats or decals in the tub or shower.  If you need to sit down in the shower, use a plastic, non-slip stool.  Keep the floor dry. Clean up any  water that spills on the floor as soon as it happens.  Remove soap buildup in the tub or shower regularly.  Attach bath mats securely with double-sided non-slip rug tape.  Do not have throw rugs and other things on the floor that can make you trip. What can I do in the bedroom?  Use night lights.  Make sure that you have a light by your bed that is easy to reach.  Do not use any sheets or blankets that are too big for your bed. They should not hang down onto the floor.  Have a firm chair that has side arms. You can use this for support while you get dressed.  Do not have throw rugs and other things on the floor that can make you trip. What can I do in the kitchen?  Clean up any spills right away.  Avoid walking on wet floors.  Keep items that you use a lot in easy-to-reach places.  If you need to reach something above you, use a strong step stool that has a grab bar.  Keep electrical cords out of the way.  Do not use floor polish or wax that makes floors slippery. If you must use wax, use non-skid floor wax.  Do not have throw rugs and  other things on the floor that can make you trip. What can I do with my stairs?  Do not leave any items on the stairs.  Make sure that there are handrails on both sides of the stairs and use them. Fix handrails that are broken or loose. Make sure that handrails are as long as the stairways.  Check any carpeting to make sure that it is firmly attached to the stairs. Fix any carpet that is loose or worn.  Avoid having throw rugs at the top or bottom of the stairs. If you do have throw rugs, attach them to the floor with carpet tape.  Make sure that you have a light switch at the top of the stairs and the bottom of the stairs. If you do not have them, ask someone to add them for you. What else can I do to help prevent falls?  Wear shoes that:  Do not have high heels.  Have rubber bottoms.  Are comfortable and fit you well.  Are closed  at the toe. Do not wear sandals.  If you use a stepladder:  Make sure that it is fully opened. Do not climb a closed stepladder.  Make sure that both sides of the stepladder are locked into place.  Ask someone to hold it for you, if possible.  Clearly mark and make sure that you can see:  Any grab bars or handrails.  First and last steps.  Where the edge of each step is.  Use tools that help you move around (mobility aids) if they are needed. These include:  Canes.  Walkers.  Scooters.  Crutches.  Turn on the lights when you go into a dark area. Replace any light bulbs as soon as they burn out.  Set up your furniture so you have a clear path. Avoid moving your furniture around.  If any of your floors are uneven, fix them.  If there are any pets around you, be aware of where they are.  Review your medicines with your doctor. Some medicines can make you feel dizzy. This can increase your chance of falling. Ask your doctor what other things that you can do to help prevent falls. This information is not intended to replace advice given to you by your health care provider. Make sure you discuss any questions you have with your health care provider. Document Released: 11/10/2008 Document Revised: 06/22/2015 Document Reviewed: 02/18/2014 Elsevier Interactive Patient Education  2017 [employer].

## 2019-08-27 NOTE — Progress Notes (Signed)
PCP notes:  Health Maintenance: Tdap- insurance/financial   Abnormal Screenings: none   Patient concerns: Discuss good eating habits   Nurse concerns: none   Next PCP appt.: 09/03/2019 @ 10:30 am

## 2019-08-30 ENCOUNTER — Ambulatory Visit: Payer: PPO | Admitting: Physician Assistant

## 2019-09-03 ENCOUNTER — Ambulatory Visit (INDEPENDENT_AMBULATORY_CARE_PROVIDER_SITE_OTHER): Payer: PPO | Admitting: Family Medicine

## 2019-09-03 ENCOUNTER — Other Ambulatory Visit: Payer: Self-pay

## 2019-09-03 ENCOUNTER — Encounter: Payer: Self-pay | Admitting: Family Medicine

## 2019-09-03 VITALS — BP 118/72 | HR 63 | Temp 97.9°F | Ht 71.5 in | Wt 204.0 lb

## 2019-09-03 DIAGNOSIS — I10 Essential (primary) hypertension: Secondary | ICD-10-CM | POA: Diagnosis not present

## 2019-09-03 DIAGNOSIS — H4031X3 Glaucoma secondary to eye trauma, right eye, severe stage: Secondary | ICD-10-CM

## 2019-09-03 DIAGNOSIS — E7800 Pure hypercholesterolemia, unspecified: Secondary | ICD-10-CM

## 2019-09-03 DIAGNOSIS — Z1211 Encounter for screening for malignant neoplasm of colon: Secondary | ICD-10-CM | POA: Diagnosis not present

## 2019-09-03 DIAGNOSIS — I6930 Unspecified sequelae of cerebral infarction: Secondary | ICD-10-CM | POA: Diagnosis not present

## 2019-09-03 DIAGNOSIS — I6523 Occlusion and stenosis of bilateral carotid arteries: Secondary | ICD-10-CM | POA: Diagnosis not present

## 2019-09-03 DIAGNOSIS — K5900 Constipation, unspecified: Secondary | ICD-10-CM

## 2019-09-03 DIAGNOSIS — E538 Deficiency of other specified B group vitamins: Secondary | ICD-10-CM | POA: Diagnosis not present

## 2019-09-03 DIAGNOSIS — Z7189 Other specified counseling: Secondary | ICD-10-CM | POA: Diagnosis not present

## 2019-09-03 DIAGNOSIS — R7303 Prediabetes: Secondary | ICD-10-CM

## 2019-09-03 DIAGNOSIS — D126 Benign neoplasm of colon, unspecified: Secondary | ICD-10-CM | POA: Diagnosis not present

## 2019-09-03 DIAGNOSIS — Z0000 Encounter for general adult medical examination without abnormal findings: Secondary | ICD-10-CM

## 2019-09-03 DIAGNOSIS — H543 Unqualified visual loss, both eyes: Secondary | ICD-10-CM | POA: Diagnosis not present

## 2019-09-03 MED ORDER — CYANOCOBALAMIN 1000 MCG/ML IJ SOLN
1000.0000 ug | Freq: Once | INTRAMUSCULAR | Status: AC
  Administered 2019-09-03: 1000 ug via INTRAMUSCULAR

## 2019-09-03 NOTE — Assessment & Plan Note (Signed)
Chronic, great control on atorvastatin 40mg  daily.  The ASCVD Risk score Evan Bussing DC Jr., et al., 2013) failed to calculate for the following reasons:   The valid total cholesterol range is 130 to 320 mg/dL

## 2019-09-03 NOTE — Assessment & Plan Note (Signed)
Stable period on miralax, metamucil PRN.

## 2019-09-03 NOTE — Assessment & Plan Note (Signed)
Encouraged limiting added sugars in diet.  

## 2019-09-03 NOTE — Assessment & Plan Note (Addendum)
No bruits appreciated. Update carotid ultrasound.

## 2019-09-03 NOTE — Assessment & Plan Note (Signed)
Continue plavix, statin.  

## 2019-09-03 NOTE — Progress Notes (Signed)
This visit was conducted in person.  BP 118/72 (BP Location: Left Arm, Patient Position: Sitting, Cuff Size: Normal)   Pulse 63   Temp 97.9 F (36.6 C) (Temporal)   Ht 5' 11.5" (1.816 m)   Wt 204 lb (92.5 kg)   SpO2 95%   BMI 28.06 kg/m    CC: CPE Subjective:    Patient ID: Evan Morales, male    DOB: 16-Jul-1945, 74 y.o.   MRN: [id number]  HPI: Evan Morales is a 74 y.o. male presenting on 09/03/2019 for Annual Exam (Prt 2. )   Saw health advisor last month for medicare wellness visit. Note reviewed.    No exam data present    Clinical Support from 08/27/2019 in [place] at [facility] Total Score 0      Fall Risk  08/27/2019 08/25/2018 08/06/2017 11/19/2016 08/05/2016  Falls in the past year? 0 0 No No No  Comment - - - - -  Number falls in past yr: 0 - - - -  Injury with Fall? 0 - - - -  Risk for fall due to : Medication side effect - - - -  Follow up Falls evaluation completed;Falls prevention discussed - - - -    Some weight loss noted.   Preventative: Colonoscopy 4/2007withadenomatous polyp. Rec rpt 5 yrs. Rpt colonoscopy deferred after CVA, has decided to proceed with iFOBs in interim (1 positive 03/2012, one negative 2017). Declined colonoscopy and cologuard last year. iFOB 08/2018 returned normal.  Prostate - always normal. Improved nocturia - doesn't drink fluids after 6pm. Desires to stop screening.  Lung cancer screening - not eligible Flu yearly. [place] 03/2019, 03/2019  Pneumovax 2013, prevnar2017 Td 2011.  zostavax 06/2012. shingrix - discussed, declines. Advanced planning -has at home.HCPOA is wifeand/or son. Does not want prolonged life support. If doesn't have capacity to make decisions would not want artificial prolongation of life. Wants to [facility] body to [facility]. Asked to bring [place] copy. Seat belt use discussed.  Sunscreen use discussed. No changing moles on skin. Non smoker  Alcohol - none    Dentist - does not see - does brush teeth and floss regularly Eye exam - sees Dr Edilia Bo regularly  Bowel - some constipation treated with metamucil and miralax  Bladder - no incontinence   Caffeine: 2-3 cups coffee/day  Lives with wife. 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 Interim medical history since our last visit reviewed. Allergies and medications reviewed and updated. Outpatient Medications Prior to Visit  Medication Sig Dispense Refill  . atorvastatin (LIPITOR) 40 MG tablet TAKE 1 TABLET BY MOUTH EVERY DAY IN THE EVENING 90 tablet 0  . brimonidine (ALPHAGAN) 0.2 % ophthalmic solution PLACE 1 DROP INTO THE RIGHT EYE 2 TIMES DAILY.    Marland Kitchen clopidogrel (PLAVIX) 75 MG tablet TAKE 1 TABLET BY MOUTH EVERY DAY 90 tablet 1  . Cyanocobalamin (B-12) 1000 MCG SUBL Place 1 tablet under the tongue daily. 30 each   . diclofenac sodium (VOLTAREN) 1 % GEL Apply 4 g topically 4 (four) times daily as needed. 500 g 6  . dorzolamide-timolol (COSOPT) 22.3-6.8 MG/ML ophthalmic solution Place 1 drop into the right eye 2 (two) times daily.    . hydrochlorothiazide (MICROZIDE) 12.5 MG capsule TAKE 1 CAPSULE BY  MOUTH EVERY DAY 90 capsule 2  . polyethylene glycol powder (GAVILAX) 17 GM/SCOOP powder TAKE 1/2 TO A WHOLE CAPFUL MIXED IN 8 OZ FLUID AS NEEDED FOR CONSTIPATION, HOLD FOR DIARRHEA. 255 g 3  . psyllium (METAMUCIL SMOOTH TEXTURE) 58.6 % powder Take 1 packet by mouth daily.    . quinapril (ACCUPRIL) 20 MG tablet TAKE 1 TABLET BY MOUTH EVERY DAY 90 tablet 0   No facility-administered medications prior to visit.     Per HPI unless specifically indicated in ROS section below Review of Systems  Constitutional: Negative for activity change, appetite change, chills, fatigue, fever and unexpected  weight change.  HENT: Negative for hearing loss.   Eyes: Negative for visual disturbance.  Respiratory: Negative for cough, chest tightness, shortness of breath and wheezing.   Cardiovascular: Negative for chest pain, palpitations and leg swelling.  Gastrointestinal: Negative for abdominal distention, abdominal pain, blood in stool, constipation, diarrhea, nausea and vomiting.  Genitourinary: Negative for difficulty urinating and hematuria.  Musculoskeletal: Negative for arthralgias, myalgias and neck pain.  Skin: Negative for rash.  Neurological: Positive for headaches (occasional). Negative for dizziness, seizures and syncope.  Hematological: Negative for adenopathy. Does not bruise/bleed easily.  Psychiatric/Behavioral: Negative for dysphoric mood. The patient is not nervous/anxious.    Objective:  BP 118/72 (BP Location: Left Arm, Patient Position: Sitting, Cuff Size: Normal)   Pulse 63   Temp 97.9 F (36.6 C) (Temporal)   Ht 5' 11.5" (1.816 m)   Wt 204 lb (92.5 kg)   SpO2 95%   BMI 28.06 kg/m   Wt Readings from Last 3 Encounters:  09/03/19 204 lb (92.5 kg)  06/07/19 211 lb (95.7 kg)  03/26/19 210 lb 1 oz (95.3 kg)      Physical Exam Vitals and nursing note reviewed.  Constitutional:      General: He is not in acute distress.    Appearance: Normal appearance. He is well-developed. He is not ill-appearing.  HENT:     Head: Normocephalic and atraumatic.     Right Ear: Hearing, tympanic membrane, ear canal and external ear normal.     Left Ear: Hearing, tympanic membrane, ear canal and external ear normal.     Mouth/Throat:     Pharynx: Uvula midline.  Eyes:     General: No scleral icterus.    Extraocular Movements: Extraocular movements intact.     Conjunctiva/sclera: Conjunctivae normal.     Pupils: Pupils are equal, round, and reactive to light.  Neck:     Thyroid: No thyroid mass, thyromegaly or thyroid tenderness.     Vascular: No carotid bruit.  Cardiovascular:      Rate and Rhythm: Normal rate and regular rhythm.     Pulses: Normal pulses.          Radial pulses are 2+ on the right side and 2+ on the left side.     Heart sounds: Normal heart sounds. No murmur heard.   Pulmonary:     Effort: Pulmonary effort is normal. No respiratory distress.     Breath sounds: Normal breath sounds. No wheezing, rhonchi or rales.  Abdominal:     General: Abdomen is flat. Bowel sounds are normal. There is no distension.     Palpations: Abdomen is soft. There is no mass.     Tenderness: There is no abdominal tenderness. There is no guarding or rebound.     Hernia: No hernia is present.  Musculoskeletal:        General: Normal range  of motion.     Cervical back: Normal range of motion and neck supple.     Right lower leg: No edema.     Left lower leg: No edema.  Lymphadenopathy:     Cervical: No cervical adenopathy.  Skin:    General: Skin is warm and dry.     Findings: No rash.  Neurological:     General: No focal deficit present.     Mental Status: He is alert and oriented to person, place, and time.     Comments: CN grossly intact, station and gait intact  Psychiatric:        Mood and Affect: Mood normal.        Behavior: Behavior normal.        Thought Content: Thought content normal.        Judgment: Judgment normal.       Results for orders placed or performed in visit on 08/27/19  Vitamin B12  Result Value Ref Range   Vitamin B-12 192 (L) 211 - 911 pg/mL  Hemoglobin A1c  Result Value Ref Range   Hgb A1c MFr Bld 5.9 4.6 - 6.5 %  Comprehensive metabolic panel  Result Value Ref Range   Sodium 136 135 - 145 mEq/L   Potassium 4.3 3.5 - 5.1 mEq/L   Chloride 101 96 - 112 mEq/L   CO2 29 19 - 32 mEq/L   Glucose, Bld 101 (H) 70 - 99 mg/dL   BUN 8 6 - 23 mg/dL   Creatinine, Ser 0.76 0.40 - 1.50 mg/dL   Total Bilirubin 2.2 (H) 0.2 - 1.2 mg/dL   Alkaline Phosphatase 56 39 - 117 U/L   AST 16 0 - 37 U/L   ALT 17 0 - 53 U/L   Total Protein 6.7  6.0 - 8.3 g/dL   Albumin 4.3 3.5 - 5.2 g/dL   GFR 100.06 >60.00 mL/min   Calcium 9.5 8.4 - 10.5 mg/dL  Lipid panel  Result Value Ref Range   Cholesterol 78 0 - 200 mg/dL   Triglycerides 62.0 0 - 149 mg/dL   HDL 39.80 >39.00 mg/dL   VLDL 12.4 0.0 - 40.0 mg/dL   LDL Cholesterol 26 0 - 99 mg/dL   Total CHOL/HDL Ratio 2    NonHDL 38.49    Assessment & Plan:  This visit occurred during the SARS-CoV-2 public health emergency.  Safety protocols were in place, including screening questions prior to the visit, additional usage of staff PPE, and extensive cleaning of exam room while observing appropriate contact time as indicated for disinfecting solutions.   Problem List Items Addressed This Visit    Vitamin B12 deficiency    Chronic, remains low.  B12 shot today then Q2 weeks for 1 month then monthly for 6 weeks.       Vision loss, bilateral    Regularly sees Dr Edilia Bo glaucoma specialist.       Prediabetes    Encouraged limiting added sugars in diet.       HYPERCHOLESTEROLEMIA    Chronic, great control on atorvastatin 56m daily.  The ASCVD Risk score (Mikey BussingDC Jr., et al., 2013) failed to calculate for the following reasons:   The valid total cholesterol range is 130 to 320 mg/dL       History of cerebrovascular accident (CVA) with residual deficit    Continue plavix, statin.       Health maintenance examination - Primary    Preventative protocols reviewed and updated unless pt declined.  Discussed healthy diet and lifestyle.       Essential hypertension    Chronic, stable. Continue current regimen of HCTZ and quinapril.       Constipation    Stable period on miralax, metamucil PRN.       COLONIC POLYPS, ADENOMATOUS    H/o this - agrees to continue iFOB. Avoid colonoscopy if able given comorbidities.       Bilateral carotid artery stenosis    No bruits appreciated. Update carotid ultrasound.       Relevant Orders   VAS US CAROTID   Angle recession glaucoma,  right, severe stage   Advanced care planning/counseling discussion    Advanced planning -has at home.HCPOA is wifeand/or son. Does not want prolonged life support. If doesn't have capacity to make decisions would not want artificial prolongation of life. [facility]. Asked to bring [place] copy.       Other Visit Diagnoses    Special screening for malignant neoplasms, colon       Relevant Orders   Fecal occult blood, imunochemical       Meds ordered this encounter  Medications  . cyanocobalamin ((VITAMIN B-12)) injection 1,000 mcg   Orders Placed This Encounter  Procedures  . Fecal occult blood, imunochemical    Standing Status:   Future    Standing Expiration Date:   09/02/2020    Patient instructions: Pass by lab to pick up stool kit.  If interested, check with pharmacy about new 2 shot shingles series (shingrix).  Bring [place] a copy of your living will/advanced directive.  B12 levels were very low - start B12 shots - every 2 weeks for a month then monthly for 6 months then we will reassess.  I do recommend mediterranean diet.  Return in 6 months for follow up visit.   Follow up plan: Return in about 6 months (around 03/05/2020) for follow up visit.  Ria Bush, MD

## 2019-09-03 NOTE — Assessment & Plan Note (Signed)
Advanced planning -has at home.HCPOA is wifeand/or son. Does not want prolonged life support. If doesn't have capacity to make decisions would not want artificial prolongation of life. Wants to donate body to [facility]. Asked to bring [place] copy.

## 2019-09-03 NOTE — Assessment & Plan Note (Signed)
H/o this - agrees to continue iFOB. Avoid colonoscopy if able given comorbidities.

## 2019-09-03 NOTE — Assessment & Plan Note (Signed)
Regularly sees Dr Edilia Bo glaucoma specialist.

## 2019-09-03 NOTE — Assessment & Plan Note (Signed)
Chronic, remains low.  B12 shot today then Q2 weeks for 1 month then monthly for 6 weeks.

## 2019-09-03 NOTE — Patient Instructions (Addendum)
Pass by lab to pick up stool kit.  If interested, check with pharmacy about new 2 shot shingles series (shingrix).  Bring [place] a copy of your living will/advanced directive.  B12 levels were very low - start B12 shots - every 2 weeks for a month then monthly for 6 months then we will reassess.  I do recommend mediterranean diet.  Return in 6 months for follow up visit.       Mediterranean Diet  Why follow it? Research shows. . Those who follow the Mediterranean diet have a reduced risk of heart disease  . The diet is associated with a reduced incidence of Parkinson's and Alzheimer's diseases . People following the diet may have longer life expectancies and lower rates of chronic diseases  . The Dietary Guidelines for Americans recommends the Mediterranean diet as an eating plan to promote health and prevent disease  What Is the Mediterranean Diet?  . Healthy eating plan based on typical foods and recipes of Mediterranean-style cooking . The diet is primarily a plant based diet; these foods should make up a majority of meals   Starches - Plant based foods should make up a majority of meals - They are an important sources of vitamins, minerals, energy, antioxidants, and fiber - Choose whole grains, foods high in fiber and minimally processed items  - Typical grain sources include wheat, oats, barley, corn, brown rice, bulgar, farro, millet, polenta, couscous  - Various types of beans include chickpeas, lentils, fava beans, black beans, white beans   Fruits  Veggies - Large quantities of antioxidant rich fruits & veggies; 6 or more servings  - Vegetables can be eaten raw or lightly drizzled with oil and cooked  - Vegetables common to the traditional Mediterranean Diet include: artichokes, arugula, beets, broccoli, brussel sprouts, cabbage, carrots, celery, collard greens, cucumbers, eggplant, kale, leeks, lemons, lettuce, mushrooms, okra, onions, peas, peppers, potatoes, pumpkin, radishes,  rutabaga, shallots, spinach, sweet potatoes, turnips, zucchini - Fruits common to the Mediterranean Diet include: apples, apricots, avocados, cherries, clementines, dates, figs, grapefruits, grapes, melons, nectarines, oranges, peaches, pears, pomegranates, strawberries, tangerines  Fats - Replace butter and margarine with healthy oils, such as olive oil, canola oil, and tahini  - Limit nuts to no more than a handful a day  - Nuts include walnuts, almonds, pecans, pistachios, pine nuts  - Limit or avoid candied, honey roasted or heavily salted nuts - Olives are central to the [employer] - can be eaten whole or used in a variety of dishes   Meats Protein - Limiting red meat: no more than a few times a month - When eating red meat: choose lean cuts and keep the portion to the size of deck of cards - Eggs: approx. 0 to 4 times a week  - Fish and lean poultry: at least 2 a week  - Healthy protein sources include, chicken, [place], lean beef, lamb - Increase intake of seafood such as tuna, salmon, trout, mackerel, shrimp, scallops - Avoid or limit high fat processed meats such as sausage and bacon  Dairy - Include moderate amounts of low fat dairy products  - Focus on healthy dairy such as fat free yogurt, skim milk, low or reduced fat cheese - Limit dairy products higher in fat such as whole or 2% milk, cheese, ice cream  Alcohol - Moderate amounts of red wine is ok  - No more than 5 oz daily for women (all ages) and men older than age 86  -  No more than 10 oz of wine daily for men younger than 89  Other - Limit sweets and other desserts  - Use herbs and spices instead of salt to flavor foods  - Herbs and spices common to the traditional Mediterranean Diet include: basil, bay leaves, chives, cloves, cumin, fennel, garlic, lavender, marjoram, mint, oregano, parsley, pepper, rosemary, sage, savory, sumac, tarragon, thyme   It's not just a diet, it's a lifestyle:  . The Mediterranean diet  includes lifestyle factors typical of those in the region  . Foods, drinks and meals are best eaten with others and savored . Daily physical activity is important for overall good health . This could be strenuous exercise like running and aerobics . This could also be more leisurely activities such as walking, housework, yard-work, or taking the stairs . Moderation is the key; a balanced and healthy diet accommodates most foods and drinks . Consider portion sizes and frequency of consumption of certain foods   Meal Ideas & Options:  . Breakfast:  o Whole wheat toast or whole wheat English muffins with peanut butter & hard boiled egg o Steel cut oats topped with apples & cinnamon and skim milk  o Fresh fruit: banana, strawberries, melon, berries, peaches  o Smoothies: strawberries, bananas, greek yogurt, peanut butter o Low fat greek yogurt with blueberries and granola  o Egg white omelet with spinach and mushrooms o Breakfast couscous: whole wheat couscous, apricots, skim milk, cranberries  . Sandwiches:  o Hummus and grilled vegetables (peppers, zucchini, squash) on whole wheat bread   o Grilled chicken on whole wheat pita with lettuce, tomatoes, cucumbers or tzatziki  o Tuna salad on whole wheat bread: tuna salad made with greek yogurt, olives, red peppers, capers, green onions o Garlic rosemary lamb pita: lamb sauted with garlic, rosemary, salt & pepper; add lettuce, cucumber, greek yogurt to pita - flavor with lemon juice and black pepper  . Seafood:  o Mediterranean grilled salmon, seasoned with garlic, basil, parsley, lemon juice and black pepper o Shrimp, lemon, and spinach whole-grain pasta salad made with low fat greek yogurt  o Seared scallops with lemon orzo  o Seared tuna steaks seasoned salt, pepper, coriander topped with tomato mixture of olives, tomatoes, olive oil, minced garlic, parsley, green onions and cappers  . Meats:  o Herbed greek chicken salad with kalamata  olives, cucumber, feta  o Red bell peppers stuffed with spinach, bulgur, lean ground beef (or lentils) & topped with feta   o Kebabs: skewers of chicken, tomatoes, onions, zucchini, squash  o [place] burgers: made with red onions, mint, dill, lemon juice, feta cheese topped with roasted red peppers . Vegetarian o Cucumber salad: cucumbers, artichoke hearts, celery, red onion, feta cheese, tossed in olive oil & lemon juice  o Hummus and whole grain pita points with a greek salad (lettuce, tomato, feta, olives, cucumbers, red onion) o Lentil soup with celery, carrots made with vegetable broth, garlic, salt and pepper  o Tabouli salad: parsley, bulgur, mint, scallions, cucumbers, tomato, radishes, lemon juice, olive oil, salt and pepper.   Vitamin B12 Deficiency Vitamin B12 deficiency occurs when the body does not have enough vitamin B12, which is an important vitamin. The body needs this vitamin:  To make red blood cells.  To make DNA. This is the genetic material inside cells.  To help the nerves work properly so they can carry messages from the brain to the body. Vitamin B12 deficiency can cause various health problems, such as  a low red blood cell count (anemia) or nerve damage. What are the causes? This condition may be caused by:  Not eating enough foods that contain vitamin B12.  Not having enough stomach acid and digestive fluids to properly absorb vitamin B12 from the food that you eat.  Certain digestive system diseases that make it hard to absorb vitamin B12. These diseases include Crohn's disease, chronic pancreatitis, and cystic fibrosis.  A condition in which the body does not make enough of a protein (intrinsic factor), resulting in too few red blood cells (pernicious anemia).  Having a surgery in which part of the stomach or small intestine is removed.  Taking certain medicines that make it hard for the body to absorb vitamin B12. These medicines include: ? Heartburn  medicines (antacids and proton pump inhibitors). ? Certain antibiotic medicines. ? Some medicines that are used to treat diabetes, tuberculosis, gout, or high cholesterol. What increases the risk? The following factors may make you more likely to develop a B12 deficiency:  Being older than age 5.  Eating a vegetarian or vegan diet, especially while you are pregnant.  Eating a poor diet while you are pregnant.  Taking certain medicines.  Having alcoholism. What are the signs or symptoms? In some cases, there are no symptoms of this condition. If the condition leads to anemia or nerve damage, various symptoms can occur, such as:  Weakness.  Fatigue.  Loss of appetite.  Weight loss.  Numbness or tingling in your hands and feet.  Redness and burning of the tongue.  Confusion or memory problems.  Depression.  Sensory problems, such as color blindness, ringing in the ears, or loss of taste.  Diarrhea or constipation.  Trouble walking. If anemia is severe, symptoms can include:  Shortness of breath.  Dizziness.  Rapid heart rate (tachycardia). How is this diagnosed? This condition may be diagnosed with a blood test to measure the level of vitamin B12 in your blood. You may also have other tests, including:  A group of tests that measure certain characteristics of blood cells (complete blood count, CBC).  A blood test to measure intrinsic factor.  A procedure where a thin tube with a camera on the end is used to look into your stomach or intestines (endoscopy). Other tests may be needed to discover the cause of B12 deficiency. How is this treated? Treatment for this condition depends on the cause. This condition may be treated by:  Changing your eating and drinking habits, such as: ? Eating more foods that contain vitamin B12. ? Drinking less alcohol or no alcohol.  Getting vitamin B12 injections.  Taking vitamin B12 supplements. Your health care provider will  tell you which dosage is best for you. Follow these instructions at home: Eating and drinking   Eat lots of healthy foods that contain vitamin B12, including: ? Meats and poultry. This includes beef, pork, chicken, [place], and organ meats, such as liver. ? Seafood. This includes clams, rainbow trout, salmon, tuna, and haddock. ? Eggs. ? Cereal and dairy products that are fortified. This means that vitamin B12 has been added to the food. Check the label on the package to see if the food is fortified. The items listed above may not be a complete list of recommended foods and beverages. Contact a dietitian for more information. General instructions  Get any injections that are prescribed by your health care provider.  Take supplements only as told by your health care provider. Follow the directions carefully.  Do not drink alcohol if your health care provider tells you not to. In some cases, you may only be asked to limit alcohol use.  Keep all follow-up visits as told by your health care provider. This is important. Contact a health care provider if:  Your symptoms come back. Get help right away if you:  Develop shortness of breath.  Have a rapid heart rate.  Have chest pain.  Become dizzy or lose consciousness. Summary  Vitamin B12 deficiency occurs when the body does not have enough vitamin B12.  The main causes of vitamin B12 deficiency include dietary deficiency, digestive diseases, pernicious anemia, and having a surgery in which part of the stomach or small intestine is removed.  In some cases, there are no symptoms of this condition. If the condition leads to anemia or nerve damage, various symptoms can occur, such as weakness, shortness of breath, and numbness.  Treatment may include getting vitamin B12 injections or taking vitamin B12 supplements. Eat lots of healthy foods that contain vitamin B12. This information is not intended to replace advice given to you by your  health care provider. Make sure you discuss any questions you have with your health care provider. Document Revised: 07/03/2018 Document Reviewed: 09/23/2017 Elsevier Patient Education  2020 [employer].

## 2019-09-03 NOTE — Assessment & Plan Note (Signed)
Preventative protocols reviewed and updated unless pt declined. Discussed healthy diet and lifestyle.  

## 2019-09-03 NOTE — Assessment & Plan Note (Signed)
Chronic, stable. Continue current regimen of HCTZ and quinapril.

## 2019-09-07 ENCOUNTER — Other Ambulatory Visit (INDEPENDENT_AMBULATORY_CARE_PROVIDER_SITE_OTHER): Payer: PPO

## 2019-09-07 DIAGNOSIS — Z1211 Encounter for screening for malignant neoplasm of colon: Secondary | ICD-10-CM

## 2019-09-07 LAB — FECAL OCCULT BLOOD, IMMUNOCHEMICAL: Fecal Occult Bld: NEGATIVE

## 2019-09-07 LAB — FECAL OCCULT BLOOD, GUAIAC: Fecal Occult Blood: NEGATIVE

## 2019-09-09 ENCOUNTER — Other Ambulatory Visit: Payer: Self-pay | Admitting: Family Medicine

## 2019-09-10 ENCOUNTER — Encounter: Payer: Self-pay | Admitting: Family Medicine

## 2019-09-16 ENCOUNTER — Ambulatory Visit (INDEPENDENT_AMBULATORY_CARE_PROVIDER_SITE_OTHER): Payer: PPO

## 2019-09-16 ENCOUNTER — Other Ambulatory Visit: Payer: Self-pay

## 2019-09-16 DIAGNOSIS — T148XXA Other injury of unspecified body region, initial encounter: Secondary | ICD-10-CM | POA: Diagnosis not present

## 2019-09-16 DIAGNOSIS — E538 Deficiency of other specified B group vitamins: Secondary | ICD-10-CM | POA: Diagnosis not present

## 2019-09-16 MED ORDER — CYANOCOBALAMIN 1000 MCG/ML IJ SOLN
1000.0000 ug | Freq: Once | INTRAMUSCULAR | Status: AC
  Administered 2019-09-16: 1000 ug via INTRAMUSCULAR

## 2019-09-16 NOTE — Progress Notes (Signed)
Per orders of Dr. Duncan, injection of vit B12 given by Corin Tilly. Patient tolerated injection well.  

## 2019-09-21 ENCOUNTER — Ambulatory Visit: Payer: PPO

## 2019-09-22 ENCOUNTER — Ambulatory Visit (HOSPITAL_COMMUNITY)
Admission: RE | Admit: 2019-09-22 | Discharge: 2019-09-22 | Disposition: A | Discharge status: Home / Self Care | Payer: PPO | Source: Ambulatory Visit | Attending: Internal Medicine | Admitting: Internal Medicine

## 2019-09-22 ENCOUNTER — Other Ambulatory Visit: Payer: Self-pay

## 2019-09-22 DIAGNOSIS — I6523 Occlusion and stenosis of bilateral carotid arteries: Secondary | ICD-10-CM | POA: Insufficient documentation

## 2019-09-30 ENCOUNTER — Ambulatory Visit (INDEPENDENT_AMBULATORY_CARE_PROVIDER_SITE_OTHER): Payer: PPO

## 2019-09-30 ENCOUNTER — Other Ambulatory Visit: Payer: Self-pay

## 2019-09-30 DIAGNOSIS — E538 Deficiency of other specified B group vitamins: Secondary | ICD-10-CM | POA: Diagnosis not present

## 2019-09-30 MED ORDER — CYANOCOBALAMIN 1000 MCG/ML IJ SOLN
1000.0000 ug | Freq: Once | INTRAMUSCULAR | Status: AC
  Administered 2019-09-30: 1000 ug via INTRAMUSCULAR

## 2019-09-30 NOTE — Progress Notes (Signed)
Per orders of Dr. Damita Dunnings in Dr. Bosie Clos absence, injection of B12 given by Randall An. Patient tolerated injection well.

## 2019-10-27 DIAGNOSIS — Z947 Corneal transplant status: Secondary | ICD-10-CM | POA: Diagnosis not present

## 2019-10-27 DIAGNOSIS — H4031X3 Glaucoma secondary to eye trauma, right eye, severe stage: Secondary | ICD-10-CM | POA: Diagnosis not present

## 2019-10-27 DIAGNOSIS — H2511 Age-related nuclear cataract, right eye: Secondary | ICD-10-CM | POA: Diagnosis not present

## 2019-10-27 DIAGNOSIS — H26492 Other secondary cataract, left eye: Secondary | ICD-10-CM | POA: Diagnosis not present

## 2019-10-27 DIAGNOSIS — Z961 Presence of intraocular lens: Secondary | ICD-10-CM | POA: Diagnosis not present

## 2019-10-28 ENCOUNTER — Telehealth: Payer: Self-pay | Admitting: *Deleted

## 2019-10-28 MED ORDER — ATORVASTATIN CALCIUM 40 MG PO TABS: ORAL_TABLET | ORAL | 1 refills | Status: DC

## 2019-10-28 NOTE — Telephone Encounter (Signed)
Pt left VM at Triage, needed lipitor refilled. done

## 2019-11-01 DIAGNOSIS — H4031X3 Glaucoma secondary to eye trauma, right eye, severe stage: Secondary | ICD-10-CM | POA: Diagnosis not present

## 2019-11-01 DIAGNOSIS — Z961 Presence of intraocular lens: Secondary | ICD-10-CM | POA: Diagnosis not present

## 2019-11-02 ENCOUNTER — Ambulatory Visit (INDEPENDENT_AMBULATORY_CARE_PROVIDER_SITE_OTHER): Payer: PPO

## 2019-11-02 ENCOUNTER — Other Ambulatory Visit: Payer: Self-pay

## 2019-11-02 DIAGNOSIS — E538 Deficiency of other specified B group vitamins: Secondary | ICD-10-CM

## 2019-11-02 MED ORDER — CYANOCOBALAMIN 1000 MCG/ML IJ SOLN
1000.0000 ug | Freq: Once | INTRAMUSCULAR | Status: AC
  Administered 2019-11-02: 1000 ug via INTRAMUSCULAR

## 2019-11-02 NOTE — Progress Notes (Signed)
Per orders of Dr. Danise Mina, injection of vitamin 12 given by Kem Parkinson. Patient tolerated injection well.

## 2019-11-11 ENCOUNTER — Ambulatory Visit (INDEPENDENT_AMBULATORY_CARE_PROVIDER_SITE_OTHER): Payer: PPO | Admitting: Family Medicine

## 2019-11-11 ENCOUNTER — Encounter: Payer: Self-pay | Admitting: Family Medicine

## 2019-11-11 ENCOUNTER — Other Ambulatory Visit: Payer: Self-pay

## 2019-11-11 VITALS — BP 110/80 | HR 65 | Temp 98.0°F | Wt 201.8 lb

## 2019-11-11 DIAGNOSIS — R634 Abnormal weight loss: Secondary | ICD-10-CM

## 2019-11-11 DIAGNOSIS — R42 Dizziness and giddiness: Secondary | ICD-10-CM | POA: Diagnosis not present

## 2019-11-11 MED ORDER — MECLIZINE HCL 12.5 MG PO TABS: 12.5000 mg | ORAL_TABLET | Freq: Three times a day (TID) | ORAL | 0 refills | Status: DC | PRN

## 2019-11-11 NOTE — Patient Instructions (Signed)
Dizziness - Drink more water - try to drink 6-8 glasses of water per day - Try to get 3 meals per day - if eating 2 meals, drink an ensure fo the third meal   If no improvement with dizziness with drinking water can try meclizine. This may make you sleepy

## 2019-11-11 NOTE — Assessment & Plan Note (Addendum)
Symptoms reproduced with orthostatic VS though BP increased appropriately. Advised hydration and increased PO intake as it could be related to weight loss/poor diet. Though etiology not entirely clear as endorses vertigo type symptoms as well and nystagmus on exam. Labs today to assess. He is strongly concerned about vertigo and does endorse room spinning. Prescription for meclizine to try if symptoms not improved with hydration but recommended f/u that does not treat.

## 2019-11-11 NOTE — Progress Notes (Signed)
Subjective:     Evan Morales is a 74 y.o. male presenting for Dizziness ("passed out" twice last night when getting out of chair)     Dizziness Pertinent negatives include no chest pain, chills, fever, nausea, sore throat or vomiting.    #Dizzines - last night was laying on the couch and got up to go the bathroom - when he stood up lost control and thought he was going to pass out - sat down w/ improvement - then he tried to stand up again - everything was "floaty"  - did not pass out but sat on the floor - hx of stroke - 4-5 years ago  Today notes low appetite - skipped breakfast, didn't want anything at lunch  Yesterday ate normally Water intake - drinking quart of water per day - 4 glasses of water per day  Meals: Breakfast: cereal Meal: eating out  Primarily eating 2 meals per day   No blood in stool or dark stools  Notes weight loss - other than today no significant change in weight loss  Notes room spinning symptoms  Review of Systems  Constitutional: Negative for chills and fever.  HENT: Positive for rhinorrhea. Negative for sore throat.   Eyes: Negative for visual disturbance.  Respiratory: Negative for shortness of breath.   Cardiovascular: Negative for chest pain and palpitations.  Gastrointestinal: Negative for diarrhea, nausea and vomiting.  Neurological: Positive for dizziness.     Social History   Tobacco Use  Smoking Status Never Smoker  Smokeless Tobacco Former Systems developer  . Types: Chew        Objective:    BP Readings from Last 3 Encounters:  11/11/19 110/80  09/03/19 118/72  06/07/19 110/68   Wt Readings from Last 3 Encounters:  11/11/19 201 lb 12 oz (91.5 kg)  09/03/19 204 lb (92.5 kg)  06/07/19 211 lb (95.7 kg)    BP 110/80   Pulse 65   Temp 98 F (36.7 C) (Temporal)   Wt 201 lb 12 oz (91.5 kg)   SpO2 98%   BMI 27.75 kg/m    Physical Exam Constitutional:      Appearance: Normal appearance. He is not  ill-appearing or diaphoretic.  HENT:     Head: Normocephalic and atraumatic.     Right Ear: Tympanic membrane and external ear normal.     Left Ear: Tympanic membrane and external ear normal.     Nose: Nose normal.  Eyes:     General: No scleral icterus.    Extraocular Movements: Extraocular movements intact.     Right eye: Nystagmus present.     Left eye: Nystagmus present.     Conjunctiva/sclera: Conjunctivae normal.     Comments: Nystagmus with both left and right gaze.  Dizziness reproduced with going from lying to sitting. Not produced with quick head movements - looking to the side or down/up  Cardiovascular:     Rate and Rhythm: Normal rate and regular rhythm.     Heart sounds: No murmur heard.   Pulmonary:     Effort: Pulmonary effort is normal. No respiratory distress.     Breath sounds: Normal breath sounds. No wheezing.  Musculoskeletal:     Cervical back: Neck supple.  Skin:    General: Skin is warm and dry.  Neurological:     Mental Status: He is alert. Mental status is at baseline.  Psychiatric:        Mood and Affect: Mood normal.  Assessment & Plan:   Problem List Items Addressed This Visit      Other   Weight loss, unintentional    ~10 lb weight loss since May 2021. Pt notes no change in appetite other than today, but is only eating 2 meals per day. Will get blood work and encouraged adding boost/ensure to daily food intake. He will also drink more until his appetite improves.       Relevant Orders   Comprehensive metabolic panel   CBC with Differential   TSH   Dizziness - Primary    Symptoms reproduced with orthostatic VS though BP increased appropriately. Advised hydration and increased PO intake as it could be related to weight loss/poor diet. Though etiology not entirely clear as endorses vertigo type symptoms as well and nystagmus on exam. Labs today to assess. He is strongly concerned about vertigo and does endorse room spinning.  Prescription for meclizine to try if symptoms not improved with hydration but recommended f/u that does not treat.       Relevant Medications   meclizine (ANTIVERT) 12.5 MG tablet   Other Relevant Orders   Comprehensive metabolic panel   CBC with Differential   TSH       Return if symptoms worsen or fail to improve.  Lesleigh Noe, MD  This visit occurred during the SARS-CoV-2 public health emergency.  Safety protocols were in place, including screening questions prior to the visit, additional usage of staff PPE, and extensive cleaning of exam room while observing appropriate contact time as indicated for disinfecting solutions.

## 2019-11-11 NOTE — Assessment & Plan Note (Signed)
~  10 lb weight loss since May 2021. Pt notes no change in appetite other than today, but is only eating 2 meals per day. Will get blood work and encouraged adding boost/ensure to daily food intake. He will also drink more until his appetite improves.

## 2019-11-12 LAB — CBC WITH DIFFERENTIAL/PLATELET
Basophils Absolute: 0 10*3/uL (ref 0.0–0.1)
Basophils Relative: 0.6 % (ref 0.0–3.0)
Eosinophils Absolute: 0.1 10*3/uL (ref 0.0–0.7)
Eosinophils Relative: 1.1 % (ref 0.0–5.0)
HCT: 42.5 % (ref 39.0–52.0)
Hemoglobin: 14.5 g/dL (ref 13.0–17.0)
Lymphocytes Relative: 17 % (ref 12.0–46.0)
Lymphs Abs: 1.2 10*3/uL (ref 0.7–4.0)
MCHC: 34.1 g/dL (ref 30.0–36.0)
MCV: 98 fl (ref 78.0–100.0)
Monocytes Absolute: 0.6 10*3/uL (ref 0.1–1.0)
Monocytes Relative: 8.6 % (ref 3.0–12.0)
Neutro Abs: 5 10*3/uL (ref 1.4–7.7)
Neutrophils Relative %: 72.7 % (ref 43.0–77.0)
Platelets: 217 10*3/uL (ref 150.0–400.0)
RBC: 4.34 Mil/uL (ref 4.22–5.81)
RDW: 13.2 % (ref 11.5–15.5)
WBC: 6.9 10*3/uL (ref 4.0–10.5)

## 2019-11-12 LAB — COMPREHENSIVE METABOLIC PANEL
ALT: 13 U/L (ref 0–53)
AST: 14 U/L (ref 0–37)
Albumin: 4.3 g/dL (ref 3.5–5.2)
Alkaline Phosphatase: 62 U/L (ref 39–117)
BUN: 10 mg/dL (ref 6–23)
CO2: 26 mEq/L (ref 19–32)
Calcium: 9.2 mg/dL (ref 8.4–10.5)
Chloride: 101 mEq/L (ref 96–112)
Creatinine, Ser: 0.78 mg/dL (ref 0.40–1.50)
GFR: 88.33 mL/min (ref >60.00)
Glucose, Bld: 117 mg/dL — ABNORMAL HIGH (ref 70–99)
Potassium: 3.9 mEq/L (ref 3.5–5.1)
Sodium: 135 mEq/L (ref 135–145)
Total Bilirubin: 2.1 mg/dL — ABNORMAL HIGH (ref 0.2–1.2)
Total Protein: 7.1 g/dL (ref 6.0–8.3)

## 2019-11-12 LAB — TSH: TSH: 0.91 u[IU]/mL (ref 0.35–4.50)

## 2019-11-13 ENCOUNTER — Other Ambulatory Visit: Payer: Self-pay | Admitting: Family Medicine

## 2019-11-13 ENCOUNTER — Ambulatory Visit: Payer: PPO

## 2019-11-15 ENCOUNTER — Other Ambulatory Visit: Payer: Self-pay | Admitting: Family Medicine

## 2019-11-16 NOTE — Telephone Encounter (Signed)
Last OV 09/03/19 Last fill 08/19/19  #90/0

## 2019-11-23 ENCOUNTER — Ambulatory Visit (INDEPENDENT_AMBULATORY_CARE_PROVIDER_SITE_OTHER): Payer: PPO | Admitting: Family Medicine

## 2019-11-23 ENCOUNTER — Encounter: Payer: Self-pay | Admitting: Family Medicine

## 2019-11-23 ENCOUNTER — Other Ambulatory Visit: Payer: Self-pay

## 2019-11-23 VITALS — BP 100/60 | HR 52 | Temp 97.9°F | Wt 203.2 lb

## 2019-11-23 DIAGNOSIS — R42 Dizziness and giddiness: Secondary | ICD-10-CM | POA: Diagnosis not present

## 2019-11-23 DIAGNOSIS — I10 Essential (primary) hypertension: Secondary | ICD-10-CM | POA: Diagnosis not present

## 2019-11-23 MED ORDER — MECLIZINE HCL 12.5 MG PO TABS: 12.5000 mg | ORAL_TABLET | Freq: Three times a day (TID) | ORAL | 0 refills | Status: DC | PRN

## 2019-11-23 NOTE — Assessment & Plan Note (Signed)
Low BP and with postural dizziness. Will stop HCTZ. Cont quinapril 20 mg. May consider dose decrease if dizziness persists and bp remains low.

## 2019-11-23 NOTE — Patient Instructions (Addendum)
#   Blood pressure - stop taking Hydrochlorothiazide - check blood pressure 2-3 times over the next week - call in 1 week with blood pressure readings and an update if dizziness has improved   If dizziness not improved -- will refer to ENT (ears, nose, throat)  Continue Meclizine as needed for dizziness or once daily as prevention

## 2019-11-23 NOTE — Progress Notes (Signed)
   Subjective:     Manpreet Strey is a 74 y.o. male presenting for Follow-up (vertigo )     HPI   #Dizziness - still worse when he stands up - is taking meclizine  - does feel like it is helping  - does endorse some symptoms with rolling over in bed - endorses room spinning sensations    Review of Systems   Social History   Tobacco Use  Smoking Status Never Smoker  Smokeless Tobacco Former Systems developer  . Types: Chew        Objective:    BP Readings from Last 3 Encounters:  11/23/19 100/60  11/11/19 110/80  09/03/19 118/72   Wt Readings from Last 3 Encounters:  11/23/19 203 lb 4 oz (92.2 kg)  11/11/19 201 lb 12 oz (91.5 kg)  09/03/19 204 lb (92.5 kg)    BP 100/60   Pulse (!) 52   Temp 97.9 F (36.6 C) (Temporal)   Wt 203 lb 4 oz (92.2 kg)   SpO2 98%   BMI 27.95 kg/m    Physical Exam Constitutional:      Appearance: Normal appearance. He is not ill-appearing or diaphoretic.  HENT:     Right Ear: External ear normal.     Left Ear: External ear normal.  Eyes:     General: No scleral icterus.    Extraocular Movements: Extraocular movements intact.     Conjunctiva/sclera: Conjunctivae normal.  Cardiovascular:     Rate and Rhythm: Normal rate and regular rhythm.     Heart sounds: No murmur heard.   Pulmonary:     Effort: Pulmonary effort is normal. No respiratory distress.     Breath sounds: Normal breath sounds. No wheezing.  Musculoskeletal:     Cervical back: Neck supple.  Skin:    General: Skin is warm and dry.  Neurological:     Mental Status: He is alert. Mental status is at baseline.  Psychiatric:        Mood and Affect: Mood normal.           Assessment & Plan:   Problem List Items Addressed This Visit      Cardiovascular and Mediastinum   Essential hypertension - Primary    Low BP and with postural dizziness. Will stop HCTZ. Cont quinapril 20 mg. May consider dose decrease if dizziness persists and bp remains low.           Other   Dizziness    He notes some improvement but continues to endorse symptoms. Stop HCTZ. Cont quinapril for BP as BP is low today. Cont prn meclizine. Call in 1 week, if no significant improvement will place referral to ENT to evaluate for possible vestibular cause. Still suspect multifactorial.       Relevant Medications   meclizine (ANTIVERT) 12.5 MG tablet       Return if symptoms worsen or fail to improve.  Lesleigh Noe, MD  This visit occurred during the SARS-CoV-2 public health emergency.  Safety protocols were in place, including screening questions prior to the visit, additional usage of staff PPE, and extensive cleaning of exam room while observing appropriate contact time as indicated for disinfecting solutions.

## 2019-11-23 NOTE — Assessment & Plan Note (Signed)
He notes some improvement but continues to endorse symptoms. Stop HCTZ. Cont quinapril for BP as BP is low today. Cont prn meclizine. Call in 1 week, if no significant improvement will place referral to ENT to evaluate for possible vestibular cause. Still suspect multifactorial.

## 2020-02-02 ENCOUNTER — Encounter: Payer: Self-pay | Admitting: Family Medicine

## 2020-02-02 ENCOUNTER — Other Ambulatory Visit: Payer: Self-pay

## 2020-02-02 ENCOUNTER — Ambulatory Visit: Payer: PPO | Admitting: Family Medicine

## 2020-02-02 DIAGNOSIS — M1712 Unilateral primary osteoarthritis, left knee: Secondary | ICD-10-CM

## 2020-02-02 NOTE — Progress Notes (Signed)
Office Visit Note   Patient: Evan Morales           Date of Birth: December 10, 1945           MRN: [id number] Visit Date: 02/02/2020 Requested by: Eustaquio Boyden, MD [address] [place],  [place] [address] PCP: Eustaquio Boyden, MD  Subjective: Chief Complaint  Patient presents with  . Left Knee - Pain    Pain is getting progressively worse. Sometimes he has trouble walking on it.     HPI: 75yo M presenting to clinic with ongoing L knee pain. Patient last seen in 2020, when he was diagnosed with primary OA and given Voltaren gel. He was encouraged to return to clinic should he want to try an injection. He says he did the voltaren for some time, but didn't feel that it was resolving the symptoms, so he stopped taking it. He says his pain is severe enough that he feels as though he functions at 'about 40-50%' due to his knee pain. Pain is worsened with standing from a seated position, or early in the morning. Denies catching/locking, or any significant swelling. Pain is over both medial and lateral joint lines.               ROS:   All other systems were reviewed and are negative.  Objective: Vital Signs: There were no vitals taken for this visit.  Physical Exam:  General:  Alert and oriented, in no acute distress. Pulm:  Breathing unlabored. Psy:  Normal mood, congruent affect. Skin:  Left knee with no bruising, rashes, or erythema. Overlying skin intact.   Left Knee Exam:  General: walks stiffly, favoring left leg. Standing exam: No varus or valgus deformity of the knee. Seated Exam:  Mild bilateral patellar crepitus, Negative J-Sign.   Palpation: Endorses tenderness to palpation over medial > lateral joint lines. No tenderness with palpation of patella or patellar tendon. No tenderness over patellar facets.   Supine exam: No effusion, normal patellar mobility.   Ligamentous Exam:  No pain or laxity with anterior/posterior drawer.  No obvious Sag.  No pain or  laxity with varus/valgus stress across the knee.   Meniscus:  McMurray with no pain or deep clicking.   Strength: Hip flexion (L1), Hip Aduction (L2), Knee Extension (L3) are 5/5 Bilaterally Foot Inversion (L4), Dorsiflexion (L5), and Eversion (S1) 5/5 Bilaterally  Sensation: Intact to light touch medial and lateral aspects of lower extremities, and lateral, dorsal, and medial aspects of foot.    Imaging: No results found.  Assessment & Plan: 75yo M presenting to clinic with chronic left knee pain. Endorses morning stiffness, and pain with standing/stairs. Suspect worsening of his underlying OA, which was appreciated on films at previous visit. Given his progression of symptoms, discussed possibility of injection therapy today, which patient was agreeable to.  - Injection performed as described below. Patient tolerated this well, and voiced improvement of his symptoms during anesthetic phase. - Strict return precautions discussed, RTC as needed for worsening/unresolving pain.      Procedures: Left Knee Cortisone Injection:  Risks and benefits of procedure discussed, Patient opted to proceed. Verbal Consent obtained.  Timeout performed.  Skin prepped in a sterile fashion with betadine before further cleansing with alcohol. Ethyl Chloride was used for topical analgesia.  Left Knee was injected with 5cc 0.25% Bupivocaine without epinephrine via the lateral midpatellar approach using a 25G, 1.5in needle. Syringe was removed from the needle, and 6mg  betamethasone was then injected  into the joint.   Patient tolerated the injection well with no immediate complications. Aftercare instructions were discussed, and patient was given strict return precautions.      PMFS History: Patient Active Problem List   Diagnosis Date Noted  . Dizziness 11/11/2019  . Weight loss, unintentional 03/19/2019  . 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 Chronic pain of both shoulders  01/30/2016  . Vitamin B12 deficiency 07/27/2015  . Overweight with body mass index (BMI) 25.0-29.9 07/27/2015  . 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 Advanced care planning/counseling discussion 07/26/2014  . Health maintenance examination 07/26/2014  . Dysthymia 07/26/2014  . Status post corneal transplant 08/17/2012  . Medicare annual wellness visit, subsequent 07/13/2012  . Constipation 04/09/2012  . Vision loss, bilateral 04/06/2012  . Bilateral carotid artery stenosis 04/06/2012  . History of cerebrovascular accident (CVA) with residual deficit 03/27/2012  . Prediabetes 07/05/2010  . HYPERCHOLESTEROLEMIA 05/04/2009  . ECZEMA 05/05/2007  . Essential hypertension 05/13/2006  . COLONIC POLYPS, ADENOMATOUS 05/15/2005   Past Medical History:  Diagnosis Date  . 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  . CVA (cerebral vascular accident) ([place]) 03/27/2012   left occipital lobe - acute or subacute infarction. No hemorrhage. Residual numbness R index and middle finger and R periorally, residual memory loss, worsened L vision  . Degenerative arthritis   . Dyslipidemia   . Dysthymia   . History of colon polyps 05/15/2005   tubular adenomas polyps  . Hypertension   . Legally blind   . Pre-diabetes     Family History  Problem Relation Age of Onset  . Cancer Mother        male  . Alzheimer's disease Mother   . Glaucoma Father   . Coronary artery disease Neg Hx   . Stroke Neg Hx   . Diabetes Neg Hx   . Colon cancer Neg Hx     Past Surgical History:  Procedure Laterality Date  . CATARACT EXTRACTION  2013   left  . 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 ESOPHAGOGASTRODUODENOSCOPY  05/24/2005   mild erythema in antrum  . 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  Occupation: Development worker, international aid: [employer]  Tobacco Use  . Smoking status: Never Smoker  . Smokeless tobacco: Former Systems developer    Types: Secondary school teacher  . Vaping Use: Never used  Substance and Sexual Activity  . Alcohol use: No    Alcohol/week: 0.0 standard drinks  . Drug use: No  . Sexual activity: Not Currently

## 2020-02-02 NOTE — Progress Notes (Signed)
I saw and examined the patient with Dr. Marga Hoots and agree with assessment and plan as outlined.    Recurrent left knee pain with joint line tenderness.  No effusion today.  Injected with cortisone.  Return as needed.

## 2020-02-10 ENCOUNTER — Other Ambulatory Visit: Payer: Self-pay | Admitting: Family Medicine

## 2020-03-07 ENCOUNTER — Other Ambulatory Visit: Payer: Self-pay

## 2020-03-07 ENCOUNTER — Encounter: Payer: Self-pay | Admitting: Family Medicine

## 2020-03-07 ENCOUNTER — Ambulatory Visit (INDEPENDENT_AMBULATORY_CARE_PROVIDER_SITE_OTHER): Payer: PPO | Admitting: Family Medicine

## 2020-03-07 VITALS — BP 124/80 | HR 70 | Temp 98.0°F | Ht 71.5 in | Wt 211.2 lb

## 2020-03-07 DIAGNOSIS — R42 Dizziness and giddiness: Secondary | ICD-10-CM | POA: Diagnosis not present

## 2020-03-07 DIAGNOSIS — F341 Dysthymic disorder: Secondary | ICD-10-CM

## 2020-03-07 DIAGNOSIS — E538 Deficiency of other specified B group vitamins: Secondary | ICD-10-CM

## 2020-03-07 DIAGNOSIS — I10 Essential (primary) hypertension: Secondary | ICD-10-CM

## 2020-03-07 LAB — BASIC METABOLIC PANEL
BUN: 14 mg/dL (ref 6–23)
CO2: 29 mEq/L (ref 19–32)
Calcium: 9.2 mg/dL (ref 8.4–10.5)
Chloride: 105 mEq/L (ref 96–112)
Creatinine, Ser: 0.83 mg/dL (ref 0.40–1.50)
GFR: 85.76 mL/min (ref >60.00)
Glucose, Bld: 94 mg/dL (ref 70–99)
Potassium: 3.9 mEq/L (ref 3.5–5.1)
Sodium: 139 mEq/L (ref 135–145)

## 2020-03-07 LAB — VITAMIN B12: Vitamin B-12: 343 pg/mL (ref 211–911)

## 2020-03-07 MED ORDER — CYANOCOBALAMIN 1000 MCG/ML IJ SOLN
1000.0000 ug | Freq: Once | INTRAMUSCULAR | Status: AC
  Administered 2020-03-07: 1000 ug via INTRAMUSCULAR

## 2020-03-07 NOTE — Patient Instructions (Addendum)
Labs today then B12 shot today  Continue current medicines, stay off hydrochlorothiazide.  Return as needed or in 6 months for wellness visit/physical

## 2020-03-07 NOTE — Assessment & Plan Note (Signed)
Only received 2 months of IM b12 replacement, continues intermittent oral replacement (about 3x/wk). Update levels then rpt b12 shot today.

## 2020-03-07 NOTE — Assessment & Plan Note (Signed)
Chronic, stable on current regimen - doing better off HCTZ.  Continue quinapril.

## 2020-03-07 NOTE — Assessment & Plan Note (Signed)
In good spirits today.

## 2020-03-07 NOTE — Progress Notes (Signed)
Patient ID: Evan Morales, male    DOB: 02-17-1945, 75 y.o.   MRN: [id number]  This visit was conducted in person.  BP 124/80 (BP Location: Left Arm, Patient Position: Sitting, Cuff Size: Normal)   Pulse 70   Temp 98 F (36.7 C) (Temporal)   Ht 5' 11.5" (1.816 m)   Wt 211 lb 3 oz (95.8 kg)   SpO2 96%   BMI 29.04 kg/m   BP Readings from Last 3 Encounters:  03/07/20 124/80  11/23/19 100/60  11/11/19 110/80   CC: 6 mo f/u visit  Subjective:   HPI: Evan Morales is a 75 y.o. male presenting on 03/07/2020 for Follow-up (Here for 6 mo f/u and B12 labs. )   Recently saw Dr Einar Pheasant for episodes of dizziness - he finds if he takes his time with transitions he tends to do better. Described vertigo "room spinning". Treated with low dose meclizine with benefit, hctz was stopped, quinapril was continued. Home BP readings overall well controlled. No HA, vision changes, CP/tightness, SOB, leg swelling. Orthostatic dizziness and vertigo have since fully resolved.   B12 deficiency - last visit we started Q2 wk shots for 1 month then monthly x6 months. Due for B12 check. Last B12 shot was 11/02/2019. He continues oral B12 1081mcg daily about 3x/wk.  Lab Results  Component Value Date   VITAMINB12 192 (L) 08/27/2019     Closely followed by Dr Edilia Bo glaucoma specialist as well as Dr Darlys Gales. Regular eye doctor is Dr Andria Frames. Planning further eye surgery.  Seeing Dr Junius Roads recent steroid injection into L knee with limited benefit.      Relevant past medical, surgical, family and social history reviewed and updated as indicated. Interim medical history since our last visit reviewed. Allergies and medications reviewed and updated. Outpatient Medications Prior to Visit  Medication Sig Dispense Refill  . atorvastatin (LIPITOR) 40 MG tablet TAKE 1 TABLET BY MOUTH EVERY DAY IN THE EVENING 90 tablet 1  . brimonidine (ALPHAGAN) 0.2 % ophthalmic solution PLACE 1 DROP INTO THE RIGHT EYE 2 TIMES  DAILY.    Marland Kitchen clopidogrel (PLAVIX) 75 MG tablet TAKE 1 TABLET BY MOUTH EVERY DAY 90 tablet 1  . Cyanocobalamin (B-12) 1000 MCG SUBL Place 1 tablet under the tongue daily. 30 each   . diclofenac sodium (VOLTAREN) 1 % GEL Apply 4 g topically 4 (four) times daily as needed. 500 g 6  . dorzolamide-timolol (COSOPT) 22.3-6.8 MG/ML ophthalmic solution Place 1 drop into the right eye 2 (two) times daily.    . mupirocin ointment (BACTROBAN) 2 % Apply topically 3 (three) times daily as needed.    . polyethylene glycol powder (GAVILAX) 17 GM/SCOOP powder TAKE 1/2 TO A WHOLE CAPFUL MIXED IN 8 OZ FLUID AS NEEDED FOR CONSTIPATION, HOLD FOR DIARRHEA. 255 g 3  . psyllium (METAMUCIL SMOOTH TEXTURE) 58.6 % powder Take 1 packet by mouth daily.    . quinapril (ACCUPRIL) 20 MG tablet TAKE 1 TABLET BY MOUTH EVERY DAY 90 tablet 0  . meclizine (ANTIVERT) 12.5 MG tablet Take 1 tablet (12.5 mg total) by mouth 3 (three) times daily as needed for dizziness. (Patient not taking: Reported on 03/07/2020) 30 tablet 0   No facility-administered medications prior to visit.     Per HPI unless specifically indicated in ROS section below Review of Systems Objective:  BP 124/80 (BP Location: Left Arm, Patient Position: Sitting, Cuff Size: Normal)   Pulse 70   Temp 98 F (36.7 C) (  Temporal)   Ht 5' 11.5" (1.816 m)   Wt 211 lb 3 oz (95.8 kg)   SpO2 96%   BMI 29.04 kg/m   Wt Readings from Last 3 Encounters:  03/07/20 211 lb 3 oz (95.8 kg)  11/23/19 203 lb 4 oz (92.2 kg)  11/11/19 201 lb 12 oz (91.5 kg)      Physical Exam Vitals and nursing note reviewed.  Constitutional:      Appearance: Normal appearance. He is not ill-appearing.  Cardiovascular:     Rate and Rhythm: Normal rate and regular rhythm.     Pulses: Normal pulses.     Heart sounds: Normal heart sounds. No murmur heard.   Pulmonary:     Effort: Pulmonary effort is normal. No respiratory distress.     Breath sounds: Normal breath sounds. No wheezing,  rhonchi or rales.  Musculoskeletal:     Right lower leg: Edema (tr) present.     Left lower leg: No edema.     Comments: S/p R knee replacement  Skin:    General: Skin is warm and dry.     Findings: No rash.  Neurological:     Mental Status: He is alert.  Psychiatric:        Mood and Affect: Mood normal.        Behavior: Behavior normal.       Results for orders placed or performed in visit on 11/11/19  Comprehensive metabolic panel  Result Value Ref Range   Sodium 135 135 - 145 mEq/L   Potassium 3.9 3.5 - 5.1 mEq/L   Chloride 101 96 - 112 mEq/L   CO2 26 19 - 32 mEq/L   Glucose, Bld 117 (H) 70 - 99 mg/dL   BUN 10 6 - 23 mg/dL   Creatinine, Ser 0.78 0.40 - 1.50 mg/dL   Total Bilirubin 2.1 (H) 0.2 - 1.2 mg/dL   Alkaline Phosphatase 62 39 - 117 U/L   AST 14 0 - 37 U/L   ALT 13 0 - 53 U/L   Total Protein 7.1 6.0 - 8.3 g/dL   Albumin 4.3 3.5 - 5.2 g/dL   GFR 88.33 >60.00 mL/min   Calcium 9.2 8.4 - 10.5 mg/dL  CBC with Differential  Result Value Ref Range   WBC 6.9 4.0 - 10.5 K/uL   RBC 4.34 4.22 - 5.81 Mil/uL   Hemoglobin 14.5 13.0 - 17.0 g/dL   HCT 42.5 39.0 - 52.0 %   MCV 98.0 78.0 - 100.0 fl   MCHC 34.1 30.0 - 36.0 g/dL   RDW 13.2 11.5 - 15.5 %   Platelets 217.0 150.0 - 400.0 K/uL   Neutrophils Relative % 72.7 43.0 - 77.0 %   Lymphocytes Relative 17.0 12.0 - 46.0 %   Monocytes Relative 8.6 3.0 - 12.0 %   Eosinophils Relative 1.1 0.0 - 5.0 %   Basophils Relative 0.6 0.0 - 3.0 %   Neutro Abs 5.0 1.4 - 7.7 K/uL   Lymphs Abs 1.2 0.7 - 4.0 K/uL   Monocytes Absolute 0.6 0.1 - 1.0 K/uL   Eosinophils Absolute 0.1 0.0 - 0.7 K/uL   Basophils Absolute 0.0 0.0 - 0.1 K/uL  TSH  Result Value Ref Range   TSH 0.91 0.35 - 4.50 uIU/mL   Lab Results  Component Value Date   HGBA1C 5.9 08/27/2019    Assessment & Plan:  This visit occurred during the SARS-CoV-2 public health emergency.  Safety protocols were in place, including screening questions prior to the  visit, additional  usage of staff PPE, and extensive cleaning of exam room while observing appropriate contact time as indicated for disinfecting solutions.   Problem List Items Addressed This Visit    Vitamin B12 deficiency - Primary    Only received 2 months of IM b12 replacement, continues intermittent oral replacement (about 3x/wk). Update levels then rpt b12 shot today.       Relevant Orders   Vitamin B12   Essential hypertension    Chronic, stable on current regimen - doing better off HCTZ.  Continue quinapril.       Relevant Orders   Basic metabolic panel   Dysthymia    In good spirits today.       Dizziness    Describes both orthostatic dizziness and episodes of vertigo.  Both have overall improved off hctz.           Meds ordered this encounter  Medications  . cyanocobalamin ((VITAMIN B-12)) injection 1,000 mcg   Orders Placed This Encounter  Procedures  . Vitamin B12  . Basic metabolic panel    Follow up plan: Return in about 6 months (around 09/04/2020) for annual exam, prior fasting for blood work, medicare wellness visit.  Ria Bush, MD

## 2020-03-07 NOTE — Assessment & Plan Note (Signed)
Describes both orthostatic dizziness and episodes of vertigo.  Both have overall improved off hctz.

## 2020-03-15 DIAGNOSIS — Z961 Presence of intraocular lens: Secondary | ICD-10-CM | POA: Diagnosis not present

## 2020-03-15 DIAGNOSIS — H18452 Nodular corneal degeneration, left eye: Secondary | ICD-10-CM | POA: Diagnosis not present

## 2020-03-15 DIAGNOSIS — H4031X3 Glaucoma secondary to eye trauma, right eye, severe stage: Secondary | ICD-10-CM | POA: Diagnosis not present

## 2020-03-15 DIAGNOSIS — Z947 Corneal transplant status: Secondary | ICD-10-CM | POA: Diagnosis not present

## 2020-03-15 DIAGNOSIS — H2511 Age-related nuclear cataract, right eye: Secondary | ICD-10-CM | POA: Diagnosis not present

## 2020-04-04 ENCOUNTER — Ambulatory Visit (INDEPENDENT_AMBULATORY_CARE_PROVIDER_SITE_OTHER): Payer: PPO

## 2020-04-04 ENCOUNTER — Other Ambulatory Visit: Payer: Self-pay

## 2020-04-04 DIAGNOSIS — E538 Deficiency of other specified B group vitamins: Secondary | ICD-10-CM

## 2020-04-04 MED ORDER — CYANOCOBALAMIN 1000 MCG/ML IJ SOLN
1000.0000 ug | Freq: Once | INTRAMUSCULAR | Status: AC
Start: 2020-04-04 — End: 2020-04-04
  Administered 2020-04-04: 1000 ug via INTRAMUSCULAR

## 2020-04-04 NOTE — Progress Notes (Signed)
Patient presented for B 12 injection to right deltoid, patient voiced no concerns nor showed any signs of distress during injection. 

## 2020-05-01 DIAGNOSIS — H4031X4 Glaucoma secondary to eye trauma, right eye, indeterminate stage: Secondary | ICD-10-CM | POA: Diagnosis not present

## 2020-05-01 DIAGNOSIS — H4031X3 Glaucoma secondary to eye trauma, right eye, severe stage: Secondary | ICD-10-CM | POA: Diagnosis not present

## 2020-05-03 ENCOUNTER — Other Ambulatory Visit: Payer: Self-pay | Admitting: Family Medicine

## 2020-05-29 DIAGNOSIS — H4031X3 Glaucoma secondary to eye trauma, right eye, severe stage: Secondary | ICD-10-CM | POA: Diagnosis not present

## 2020-05-29 DIAGNOSIS — Z961 Presence of intraocular lens: Secondary | ICD-10-CM | POA: Diagnosis not present

## 2020-05-29 DIAGNOSIS — Z947 Corneal transplant status: Secondary | ICD-10-CM | POA: Diagnosis not present

## 2020-05-29 DIAGNOSIS — H18452 Nodular corneal degeneration, left eye: Secondary | ICD-10-CM | POA: Diagnosis not present

## 2020-05-29 DIAGNOSIS — Z79899 Other long term (current) drug therapy: Secondary | ICD-10-CM | POA: Diagnosis not present

## 2020-05-29 DIAGNOSIS — Z7952 Long term (current) use of systemic steroids: Secondary | ICD-10-CM | POA: Diagnosis not present

## 2020-05-29 DIAGNOSIS — H2511 Age-related nuclear cataract, right eye: Secondary | ICD-10-CM | POA: Diagnosis not present

## 2020-06-03 ENCOUNTER — Other Ambulatory Visit: Payer: Self-pay | Admitting: Family Medicine

## 2020-06-05 ENCOUNTER — Other Ambulatory Visit: Payer: Self-pay | Admitting: Family Medicine

## 2020-06-06 DIAGNOSIS — Z7952 Long term (current) use of systemic steroids: Secondary | ICD-10-CM | POA: Diagnosis not present

## 2020-06-06 DIAGNOSIS — H18452 Nodular corneal degeneration, left eye: Secondary | ICD-10-CM | POA: Diagnosis not present

## 2020-06-06 DIAGNOSIS — H4031X3 Glaucoma secondary to eye trauma, right eye, severe stage: Secondary | ICD-10-CM | POA: Diagnosis not present

## 2020-06-06 DIAGNOSIS — H2511 Age-related nuclear cataract, right eye: Secondary | ICD-10-CM | POA: Diagnosis not present

## 2020-06-06 DIAGNOSIS — Z48810 Encounter for surgical aftercare following surgery on the sense organs: Secondary | ICD-10-CM | POA: Diagnosis not present

## 2020-06-06 DIAGNOSIS — Z961 Presence of intraocular lens: Secondary | ICD-10-CM | POA: Diagnosis not present

## 2020-06-06 DIAGNOSIS — Z947 Corneal transplant status: Secondary | ICD-10-CM | POA: Diagnosis not present

## 2020-06-06 DIAGNOSIS — Z9889 Other specified postprocedural states: Secondary | ICD-10-CM | POA: Diagnosis not present

## 2020-08-09 DIAGNOSIS — H4031X4 Glaucoma secondary to eye trauma, right eye, indeterminate stage: Secondary | ICD-10-CM | POA: Diagnosis not present

## 2020-08-09 DIAGNOSIS — H524 Presbyopia: Secondary | ICD-10-CM | POA: Diagnosis not present

## 2020-08-09 DIAGNOSIS — H5202 Hypermetropia, left eye: Secondary | ICD-10-CM | POA: Diagnosis not present

## 2020-08-09 DIAGNOSIS — H40002 Preglaucoma, unspecified, left eye: Secondary | ICD-10-CM | POA: Diagnosis not present

## 2020-08-09 DIAGNOSIS — H52202 Unspecified astigmatism, left eye: Secondary | ICD-10-CM | POA: Diagnosis not present

## 2020-08-17 ENCOUNTER — Telehealth: Payer: Self-pay

## 2020-08-17 NOTE — Telephone Encounter (Signed)
Pt's wife left v/m pt has had both pfizer covid vaccines and first pfizer booster on 11/16/2019. Pt's wife said no one has ever said pt needs to take second booster but pt's wife wants to know if Dr Danise Mina wants pt to take the second booster; pt's wife request cb after Dr Danise Mina reviews note.

## 2020-08-17 NOTE — Telephone Encounter (Signed)
With h/o stroke and age, reasonable to get second booster if he's tolerated vaccines to date.

## 2020-08-17 NOTE — Telephone Encounter (Signed)
Spoke with pt's wife, [name] (on dpr), relaying Dr. Synthia Innocent message.  She verbalizes understanding and will make an appt for him and her.

## 2020-08-27 ENCOUNTER — Other Ambulatory Visit: Payer: Self-pay | Admitting: Family Medicine

## 2020-08-27 DIAGNOSIS — E538 Deficiency of other specified B group vitamins: Secondary | ICD-10-CM

## 2020-08-27 DIAGNOSIS — E7800 Pure hypercholesterolemia, unspecified: Secondary | ICD-10-CM

## 2020-08-27 DIAGNOSIS — R7303 Prediabetes: Secondary | ICD-10-CM

## 2020-08-28 ENCOUNTER — Telehealth: Payer: Self-pay

## 2020-08-28 NOTE — Telephone Encounter (Signed)
I spoke with pt; pt wants to confirm has appt on 08/29/20 at 8:05 AM. Advised pt per appt notes pt has phone appt with Nurse health advisor 08/29/20 at 2 PM. Raquel Sarna front office mgr also confirmed per pts appt notes that appt is correct. Pt voiced understanding and appreciative.nothing further needed at this time.

## 2020-08-29 ENCOUNTER — Ambulatory Visit (INDEPENDENT_AMBULATORY_CARE_PROVIDER_SITE_OTHER): Payer: PPO

## 2020-08-29 ENCOUNTER — Other Ambulatory Visit: Payer: PPO

## 2020-08-29 DIAGNOSIS — Z0000 Encounter for general adult medical examination without abnormal findings: Secondary | ICD-10-CM

## 2020-08-29 NOTE — Progress Notes (Signed)
PCP notes:  Health Maintenance: Shingrix- due    Abnormal Screenings: none   Patient concerns: none   Nurse concerns: none   Next PCP appt.: 11/27/2020 @ 4 pm

## 2020-08-29 NOTE — Progress Notes (Signed)
Subjective:   Evan Morales is a 75 y.o. male who presents for Medicare Annual/Subsequent preventive examination.  Review of Systems: N/A      I connected with the patient today by telephone and verified that I am speaking with the correct person using two identifiers. Location patient: home Location nurse: work Persons participating in the telephone visit: patient, nurse.   I discussed the limitations, risks, security and privacy concerns of performing an evaluation and management service by telephone and the availability of in person appointments. I also discussed with the patient that there may be a patient responsible charge related to this service. The patient expressed understanding and verbally consented to this telephonic visit.        Cardiac Risk Factors include: advanced age (>24mn, >>75women);male gender;hypertension;Other (see comment), Risk factor comments: hypercholesterolemia     Objective:    Today's Vitals   There is no height or weight on file to calculate BMI.  Advanced Directives 08/29/2020 08/27/2019 08/06/2017 10/01/2016 10/01/2016 09/30/2016  Does Patient Have a Medical Advance Directive? Yes Yes Yes - - Yes  Type of Advance Directive H[place]inLiving will H[place]ppellLiving will H[place]eddoLiving will Healthcare Power of [place] Chart? No - copy requested No - copy requested Yes No - copy requested - No - copy requested  Would patient like information on creating a medical advance directive? - - - - - Yes (ED - Information included in AVS)    Current Medications (verified) Outpatient Encounter Medications as of 08/29/2020  Medication Sig   atorvastatin (LIPITOR) 40 MG tablet TAKE 1 TABLET BY MOUTH EVERY DAY IN THE EVENING   brimonidine (ALPHAGAN) 0.2 % ophthalmic solution PLACE 1 DROP INTO THE RIGHT EYE 2  TIMES DAILY.   clopidogrel (PLAVIX) 75 MG tablet TAKE 1 TABLET BY MOUTH EVERY DAY   Cyanocobalamin (B-12) 1000 MCG SUBL Place 1 tablet under the tongue daily.   diclofenac sodium (VOLTAREN) 1 % GEL Apply 4 g topically 4 (four) times daily as needed.   dorzolamide-timolol (COSOPT) 22.3-6.8 MG/ML ophthalmic solution Place 1 drop into the right eye 2 (two) times daily.   meclizine (ANTIVERT) 12.5 MG tablet Take 1 tablet (12.5 mg total) by mouth 3 (three) times daily as needed for dizziness.   mupirocin ointment (BACTROBAN) 2 % Apply topically 3 (three) times daily as needed.   polyethylene glycol powder (GAVILAX) 17 GM/SCOOP powder TAKE 1/2 TO A WHOLE CAPFUL MIXED IN 8 OZ FLUID AS NEEDED FOR CONSTIPATION, HOLD FOR DIARRHEA.   psyllium (METAMUCIL SMOOTH TEXTURE) 58.6 % powder Take 1 packet by mouth daily.   quinapril (ACCUPRIL) 20 MG tablet TAKE 1 TABLET BY MOUTH EVERY DAY   No facility-administered encounter medications on file as of 08/29/2020.    Allergies (verified) Patient has no known allergies.   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 No hemorrhage. 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   right, osteoarthritis   Family History  Problem Relation Age of Onset   Cancer Mother        male   Alzheimer's disease Mother    Glaucoma Father    Coronary artery  disease Neg Hx    Stroke Neg Hx    Diabetes Neg Hx    Colon cancer Neg Hx    Social History   Socioeconomic History   Marital status: Married    Spouse name: Not on file   Number of children: 2   Years of education: Not on file   Highest education level: Not on file  Occupational History   Occupation: Development worker, international aid: OTHER  Tobacco Use   Smoking status: Never   Smokeless tobacco: Former    Types: Nurse, children's Use: Never used  Substance and Sexual Activity   Alcohol use: No    Alcohol/week: 0.0 standard drinks   Drug use: No   Sexual activity: Not Currently  Other Topics Concern   Not on file  Social History Narrative   Caffeine: 2-3 cups coffee/day   Lives with wife.  (grown children)   Occupation: retired, was Occupational hygienist   Activity: golf, fishing   Diet: good water, fruits/vegetables   Social Determinants of Radio broadcast assistant Strain: Low Risk    Difficulty of Paying Living Expenses: Not hard at all  Food Insecurity: No Food Insecurity   Worried About Charity fundraiser in the Last Year: Never true   Arboriculturist in the Last Year: Never true  Transportation Needs: No Transportation Needs   Lack of Transportation (Medical): No   Lack of Transportation (Non-Medical): No  Physical Activity: Inactive   Days of Exercise per Week: 0 days   Minutes of Exercise per Session: 0 min  Stress: No Stress Concern Present   Feeling of Stress : Not at all  Social Connections: Not on file    Tobacco Counseling Counseling given: Not Answered   Clinical Intake:  Pre-visit preparation completed: Yes  Pain : No/denies pain     Nutritional Risks: None Diabetes: No  How often do you need to have someone help you when you read instructions, pamphlets, or other written materials from your doctor or pharmacy?: 1 - Never  Diabetic: No Nutrition Risk Assessment:  Has the patient had any N/V/D within the last 2 months?   No  Does the patient have any non-healing wounds?  No  Has the patient had any unintentional weight loss or weight gain?  No   Diabetes:  Is the patient diabetic?  No  If diabetic, was a CBG obtained today?   N/A Did the patient bring in their glucometer from home?   N/A How often do you monitor your CBG's? N/A.   Financial Strains and Diabetes Management:  Are you having any financial strains with the device, your supplies or your medication?  N/A .  Does the patient want to be seen by Chronic Care Management for management of their diabetes?   N/A Would the patient like to be referred to a Nutritionist or for Diabetic Management?   N/A   Interpreter Needed?: No  Information entered  by :: CJohnson, RN   Activities of Daily Living In your present state of health, do you have any difficulty performing the following activities: 08/29/2020  Hearing? N  Vision? Y  Comment some vision issues noted  Difficulty concentrating or making decisions? N  Walking or climbing stairs? N  Dressing or bathing? N  Doing errands, shopping? N  Preparing Food and eating ? N  Using the Toilet? N  In the past six months, have you accidently leaked urine? N  Do you have problems with loss of bowel control? N  Managing your Medications? N  Managing your Finances? N  Housekeeping or managing your Housekeeping? N  Some recent data might be hidden    Patient Care Team: Ria Bush, MD as PCP - General (Family Medicine)  Indicate any recent Medical Services you may have received from other than [facility] providers in the past year (date may be approximate).     Assessment:   This is a routine wellness examination for Orwin.  Hearing/Vision screen Vision Screening - Comments:: Patient gets annual eye exams   Dietary issues and exercise activities discussed: Current Exercise Habits: The patient does not participate in regular exercise at present, Exercise limited by: None identified   Goals  Addressed             This Visit's Progress    Patient Stated       08/29/2020, I will maintain and continue medications as prescribed.        Depression Screen PHQ 2/9 Scores 08/29/2020 08/27/2019 08/25/2018 08/06/2017 08/05/2016 07/27/2015 07/26/2014  PHQ - 2 Score 0 0 0 0 0 1 0  PHQ- 9 Score 0 0 - 0 - - -    Fall Risk Fall Risk  08/29/2020 08/27/2019 08/25/2018 08/06/2017 11/19/2016  Falls in the past year? 0 0 0 No No  Comment - - - - -  Number falls in past yr: 0 0 - - -  Injury with Fall? 0 0 - - -  Risk for fall due to : Medication side effect Medication side effect - - -  Follow up Falls evaluation completed;Falls prevention discussed Falls evaluation completed;Falls prevention discussed - - -    FALL RISK PREVENTION PERTAINING TO THE HOME:  Any stairs in or around the home? Yes  If so, are there any without handrails? No  Home free of loose throw rugs in walkways, pet beds, electrical cords, etc? Yes  Adequate lighting in your home to reduce risk of falls? Yes   ASSISTIVE DEVICES UTILIZED TO PREVENT FALLS:  Life alert? No  Use of a cane, walker or w/c? Yes  Grab bars in the bathroom? Yes  Shower chair or bench in shower? No  Elevated toilet seat or a handicapped toilet? No   TIMED UP AND GO:  Was the test performed?  N/A telephone visit .    Cognitive Function: MMSE - Mini Mental State Exam 08/29/2020 08/27/2019 08/06/2017  Not completed: Refused - -  Orientation to time - 5 5  Orientation to Place - 5 5  Registration - 3 3  Attention/ Calculation - 5 0  Recall - 3 3  Language- name 2 objects - - 0  Language- repeat - 1 1  Language- follow 3 step command - - 3  Language- read & follow direction - - 0  Write a sentence - - 0  Copy design - - 0  Total score - - 20  Mini Cog  Mini-Cog screen was  not completed. Patient refused. Maximum score is 22. A value of 0 denotes this part of the MMSE was not completed or the patient failed this part of the Mini-Cog  screening.       Immunizations Immunization History  Administered Date(s) Administered   Fluad Quad(high Dose 65+) 02/26/2019   Influenza Split 01/02/2011   Influenza,inj,Quad PF,6+ Mos 02/23/2013, 11/26/2013, 10/20/2014, 01/30/2016, 12/03/2016   PFIZER Comirnaty(Gray Top)Covid-19 Tri-Sucrose Vaccine 08/21/2020   PFIZER(Purple Top)SARS-COV-2 Vaccination 03/11/2019, 04/05/2019, 11/16/2019   Pneumococcal Conjugate-13 07/27/2015   Pneumococcal Polysaccharide-23 07/10/2011   Td 05/16/2009   Zoster, Live 07/13/2012    TDAP status: Due, Education has been provided regarding the importance of this vaccine. Advised may receive this vaccine at local pharmacy or Health Dept. Aware to provide a copy of the vaccination record if obtained from local pharmacy or Health Dept. Verbalized acceptance and understanding.  Flu Vaccine status: due Fall 2022  Pneumococcal vaccine status: Up to date  Covid-19 vaccine status: Completed vaccines  Qualifies for Shingles Vaccine? Yes   Zostavax completed Yes   Shingrix Completed?: No.    Education has been provided regarding the importance of this vaccine. Patient has been advised to call insurance company to determine out of pocket expense if they have not yet received this vaccine. Advised may also receive vaccine at local pharmacy or Health Dept. Verbalized acceptance and understanding.  Screening Tests Health Maintenance  Topic Date Due   Zoster Vaccines- Shingrix (1 of 2) Never done   INFLUENZA VACCINE  08/28/2020   TETANUS/TDAP  08/27/2023 (Originally 05/17/2019)   COLON CANCER SCREENING ANNUAL FOBT  09/06/2020   COVID-19 Vaccine (5 - Booster for Pfizer series) 12/22/2020   Hepatitis C Screening  Completed   PNA vac Low Risk Adult  Completed   HPV VACCINES  Aged Out   COLONOSCOPY (Pts 45-57yr Insurance coverage will need to be confirmed)  Discontinued    Health Maintenance  Health Maintenance Due  Topic Date Due   Zoster Vaccines- Shingrix  (1 of 2) Never done   INFLUENZA VACCINE  08/28/2020    Colorectal cancer screening: Type of screening: FOBT/FIT. Completed 09/07/2019. Repeat every 1 years  Lung Cancer Screening: (Low Dose CT Chest recommended if Age 76-80years, 30 pack-year currently smoking OR have quit w/in 15years.) does not qualify.   Additional Screening:  Hepatitis C Screening: does qualify; Completed 07/24/2015  Vision Screening: Recommended annual ophthalmology exams for early detection of glaucoma and other disorders of the eye. Is the patient up to date with their annual eye exam?  Yes  Who is the provider or what is the name of the office in which the patient attends annual eye exams? Dr. MMarta LamasIf pt is not established with a provider, would they like to be referred to a provider to establish care? No .   Dental Screening: Recommended annual dental exams for proper oral hygiene  Community Resource Referral / Chronic Care Management: CRR required this visit?  No   CCM required this visit?  No      Plan:     I have personally reviewed and noted the following in the patient's chart:   Medical and social history Use of alcohol, tobacco or illicit drugs  Current medications and supplements including opioid prescriptions. Patient is not currently taking opioid prescriptions. Functional ability and status Nutritional status Physical activity Advanced directives List of other physicians Hospitalizations, surgeries, and ER visits in previous 12 months Vitals Screenings to include cognitive, depression, and falls  Referrals and appointments  In addition, I have reviewed and discussed with patient certain preventive protocols, quality metrics, and best practice recommendations. A written personalized care plan for preventive services as well as general preventive health recommendations were provided to patient.   Due to this being a telephonic visit, the after visit summary with patients  personalized plan was offered to patient via office or my-chart. Patient preferred to pick up at office at next visit or via mychart.   Andrez Grime, LPN   [id number]

## 2020-08-29 NOTE — Patient Instructions (Signed)
Mr. Basaldua , Thank you for taking time to come for your Medicare Wellness Visit. I appreciate your ongoing commitment to your health goals. Please review the following plan we discussed and let me know if I can assist you in the future.   Screening recommendations/referrals: Colonoscopy: FOBT completed 09/07/2019, due 09/06/2020 Recommended yearly ophthalmology/optometry visit for glaucoma screening and checkup Recommended yearly dental visit for hygiene and checkup  Vaccinations: Influenza vaccine: due Fall 2022  Pneumococcal vaccine: Completed series Tdap vaccine: decline-insurance Shingles vaccine: due, check with your insurance regarding coverage if interested    Covid-19: Completed series  Advanced directives: Please bring a copy of your POA (Power of Attorney) and/or Living Will to your next appointment.   Conditions/risks identified: hypertension, hypercholesterolemia   Next appointment: Follow up in one year for your annual wellness visit.   Preventive Care 76 Years and Older, Male Preventive care refers to lifestyle choices and visits with your health care provider that can promote health and wellness. What does preventive care include? A yearly physical exam. This is also called an annual well check. Dental exams once or twice a year. Routine eye exams. Ask your health care provider how often you should have your eyes checked. Personal lifestyle choices, including: Daily care of your teeth and gums. Regular physical activity. Eating a healthy diet. Avoiding tobacco and drug use. Limiting alcohol use. Practicing safe sex. Taking low doses of aspirin every day. Taking vitamin and mineral supplements as recommended by your health care provider. What happens during an annual well check? The services and screenings done by your health care provider during your annual well check will depend on your age, overall health, lifestyle risk factors, and family history of  disease. Counseling  Your health care provider may ask you questions about your: Alcohol use. Tobacco use. Drug use. Emotional well-being. Home and relationship well-being. Sexual activity. Eating habits. History of falls. Memory and ability to understand (cognition). Work and work Statistician. Screening  You may have the following tests or measurements: Height, weight, and BMI. Blood pressure. Lipid and cholesterol levels. These may be checked every 5 years, or more frequently if you are over 61 years old. Skin check. Lung cancer screening. You may have this screening every year starting at age 39 if you have a 30-pack-year history of smoking and currently smoke or have quit within the past 15 years. Fecal occult blood test (FOBT) of the stool. You may have this test every year starting at age 14. Flexible sigmoidoscopy or colonoscopy. You may have a sigmoidoscopy every 5 years or a colonoscopy every 10 years starting at age 76. Prostate cancer screening. Recommendations will vary depending on your family history and other risks. Hepatitis C blood test. Hepatitis B blood test. Sexually transmitted disease (STD) testing. Diabetes screening. This is done by checking your blood sugar (glucose) after you have not eaten for a while (fasting). You may have this done every 1-3 years. Abdominal aortic aneurysm (AAA) screening. You may need this if you are a current or former smoker. Osteoporosis. You may be screened starting at age 41 if you are at high risk. Talk with your health care provider about your test results, treatment options, and if necessary, the need for more tests. Vaccines  Your health care provider may recommend certain vaccines, such as: Influenza vaccine. This is recommended every year. Tetanus, diphtheria, and acellular pertussis (Tdap, Td) vaccine. You may need a Td booster every 10 years. Zoster vaccine. You may need this after  age 55. Pneumococcal 13-valent  conjugate (PCV13) vaccine. One dose is recommended after age 76. Pneumococcal polysaccharide (PPSV23) vaccine. One dose is recommended after age 63. Talk to your health care provider about which screenings and vaccines you need and how often you need them. This information is not intended to replace advice given to you by your health care provider. Make sure you discuss any questions you have with your health care provider. Document Released: 02/10/2015 Document Revised: 10/04/2015 Document Reviewed: 11/15/2014 Elsevier Interactive Patient Education  2017 [place] Prevention in the Home Falls can cause injuries. They can happen to people of all ages. There are many things you can do to make your home safe and to help prevent falls. What can I do on the outside of my home? Regularly fix the edges of walkways and driveways and fix any cracks. Remove anything that might make you trip as you walk through a door, such as a raised step or threshold. Trim any bushes or trees on the path to your home. Use bright outdoor lighting. Clear any walking paths of anything that might make someone trip, such as rocks or tools. Regularly check to see if handrails are loose or broken. Make sure that both sides of any steps have handrails. Any raised decks and porches should have guardrails on the edges. Have any leaves, snow, or ice cleared regularly. Use sand or salt on walking paths during winter. Clean up any spills in your garage right away. This includes oil or grease spills. What can I do in the bathroom? Use night lights. Install grab bars by the toilet and in the tub and shower. Do not use towel bars as grab bars. Use non-skid mats or decals in the tub or shower. If you need to sit down in the shower, use a plastic, non-slip stool. Keep the floor dry. Clean up any water that spills on the floor as soon as it happens. Remove soap buildup in the tub or shower regularly. Attach bath mats  securely with double-sided non-slip rug tape. Do not have throw rugs and other things on the floor that can make you trip. What can I do in the bedroom? Use night lights. Make sure that you have a light by your bed that is easy to reach. Do not use any sheets or blankets that are too big for your bed. They should not hang down onto the floor. Have a firm chair that has side arms. You can use this for support while you get dressed. Do not have throw rugs and other things on the floor that can make you trip. What can I do in the kitchen? Clean up any spills right away. Avoid walking on wet floors. Keep items that you use a lot in easy-to-reach places. If you need to reach something above you, use a strong step stool that has a grab bar. Keep electrical cords out of the way. Do not use floor polish or wax that makes floors slippery. If you must use wax, use non-skid floor wax. Do not have throw rugs and other things on the floor that can make you trip. What can I do with my stairs? Do not leave any items on the stairs. Make sure that there are handrails on both sides of the stairs and use them. Fix handrails that are broken or loose. Make sure that handrails are as long as the stairways. Check any carpeting to make sure that it is firmly attached to the stairs.  Fix any carpet that is loose or worn. Avoid having throw rugs at the top or bottom of the stairs. If you do have throw rugs, attach them to the floor with carpet tape. Make sure that you have a light switch at the top of the stairs and the bottom of the stairs. If you do not have them, ask someone to add them for you. What else can I do to help prevent falls? Wear shoes that: Do not have high heels. Have rubber bottoms. Are comfortable and fit you well. Are closed at the toe. Do not wear sandals. If you use a stepladder: Make sure that it is fully opened. Do not climb a closed stepladder. Make sure that both sides of the stepladder  are locked into place. Ask someone to hold it for you, if possible. Clearly mark and make sure that you can see: Any grab bars or handrails. First and last steps. Where the edge of each step is. Use tools that help you move around (mobility aids) if they are needed. These include: Canes. Walkers. Scooters. Crutches. Turn on the lights when you go into a dark area. Replace any light bulbs as soon as they burn out. Set up your furniture so you have a clear path. Avoid moving your furniture around. If any of your floors are uneven, fix them. If there are any pets around you, be aware of where they are. Review your medicines with your doctor. Some medicines can make you feel dizzy. This can increase your chance of falling. Ask your doctor what other things that you can do to help prevent falls. This information is not intended to replace advice given to you by your health care provider. Make sure you discuss any questions you have with your health care provider. Document Released: 11/10/2008 Document Revised: 06/22/2015 Document Reviewed: 02/18/2014 Elsevier Interactive Patient Education  2017 [employer].

## 2020-08-31 ENCOUNTER — Other Ambulatory Visit: Payer: Self-pay | Admitting: Family Medicine

## 2020-09-05 ENCOUNTER — Encounter: Payer: PPO | Admitting: Family Medicine

## 2020-10-11 DIAGNOSIS — H4031X3 Glaucoma secondary to eye trauma, right eye, severe stage: Secondary | ICD-10-CM | POA: Diagnosis not present

## 2020-10-11 DIAGNOSIS — H2511 Age-related nuclear cataract, right eye: Secondary | ICD-10-CM | POA: Diagnosis not present

## 2020-10-11 DIAGNOSIS — Z961 Presence of intraocular lens: Secondary | ICD-10-CM | POA: Diagnosis not present

## 2020-10-11 DIAGNOSIS — Z9889 Other specified postprocedural states: Secondary | ICD-10-CM | POA: Diagnosis not present

## 2020-10-31 ENCOUNTER — Other Ambulatory Visit: Payer: Self-pay | Admitting: Family Medicine

## 2020-11-20 ENCOUNTER — Other Ambulatory Visit: Payer: PPO

## 2020-11-27 ENCOUNTER — Other Ambulatory Visit: Payer: Self-pay

## 2020-11-27 ENCOUNTER — Ambulatory Visit (INDEPENDENT_AMBULATORY_CARE_PROVIDER_SITE_OTHER): Payer: PPO | Admitting: Family Medicine

## 2020-11-27 ENCOUNTER — Encounter: Payer: PPO | Admitting: Family Medicine

## 2020-11-27 ENCOUNTER — Encounter: Payer: Self-pay | Admitting: Family Medicine

## 2020-11-27 VITALS — BP 140/86 | HR 63 | Temp 98.0°F | Ht 72.0 in | Wt 207.2 lb

## 2020-11-27 DIAGNOSIS — Z7189 Other specified counseling: Secondary | ICD-10-CM | POA: Diagnosis not present

## 2020-11-27 DIAGNOSIS — I10 Essential (primary) hypertension: Secondary | ICD-10-CM

## 2020-11-27 DIAGNOSIS — Z1211 Encounter for screening for malignant neoplasm of colon: Secondary | ICD-10-CM

## 2020-11-27 DIAGNOSIS — E7800 Pure hypercholesterolemia, unspecified: Secondary | ICD-10-CM | POA: Diagnosis not present

## 2020-11-27 DIAGNOSIS — E538 Deficiency of other specified B group vitamins: Secondary | ICD-10-CM | POA: Diagnosis not present

## 2020-11-27 DIAGNOSIS — I6523 Occlusion and stenosis of bilateral carotid arteries: Secondary | ICD-10-CM

## 2020-11-27 DIAGNOSIS — Z0000 Encounter for general adult medical examination without abnormal findings: Secondary | ICD-10-CM

## 2020-11-27 DIAGNOSIS — R7303 Prediabetes: Secondary | ICD-10-CM | POA: Diagnosis not present

## 2020-11-27 DIAGNOSIS — I6930 Unspecified sequelae of cerebral infarction: Secondary | ICD-10-CM | POA: Diagnosis not present

## 2020-11-27 DIAGNOSIS — H543 Unqualified visual loss, both eyes: Secondary | ICD-10-CM

## 2020-11-27 LAB — COMPREHENSIVE METABOLIC PANEL
ALT: 16 U/L (ref 0–53)
AST: 16 U/L (ref 0–37)
Albumin: 4.3 g/dL (ref 3.5–5.2)
Alkaline Phosphatase: 75 U/L (ref 39–117)
BUN: 16 mg/dL (ref 6–23)
CO2: 28 mEq/L (ref 19–32)
Calcium: 9.4 mg/dL (ref 8.4–10.5)
Chloride: 103 mEq/L (ref 96–112)
Creatinine, Ser: 0.92 mg/dL (ref 0.40–1.50)
GFR: 81.09 mL/min (ref >60.00)
Glucose, Bld: 107 mg/dL — ABNORMAL HIGH (ref 70–99)
Potassium: 4 mEq/L (ref 3.5–5.1)
Sodium: 138 mEq/L (ref 135–145)
Total Bilirubin: 1.8 mg/dL — ABNORMAL HIGH (ref 0.2–1.2)
Total Protein: 7 g/dL (ref 6.0–8.3)

## 2020-11-27 LAB — LIPID PANEL
Cholesterol: 91 mg/dL (ref 0–200)
HDL: 44.7 mg/dL (ref >39.00)
LDL Cholesterol: 37 mg/dL (ref 0–99)
NonHDL: 46.04
Total CHOL/HDL Ratio: 2
Triglycerides: 46 mg/dL (ref 0.0–149.0)
VLDL: 9.2 mg/dL (ref 0.0–40.0)

## 2020-11-27 LAB — HEMOGLOBIN A1C: Hgb A1c MFr Bld: 5.9 % (ref 4.6–6.5)

## 2020-11-27 LAB — VITAMIN B12: Vitamin B-12: 246 pg/mL (ref 211–911)

## 2020-11-27 MED ORDER — CYANOCOBALAMIN 1000 MCG/ML IJ SOLN
1000.0000 ug | Freq: Once | INTRAMUSCULAR | Status: AC
  Administered 2020-11-27: 1000 ug via INTRAMUSCULAR

## 2020-11-27 NOTE — Assessment & Plan Note (Signed)
Preventative protocols reviewed and updated unless pt declined. Discussed healthy diet and lifestyle.  

## 2020-11-27 NOTE — Patient Instructions (Addendum)
Labs today  B12 shot today  Start taking oral vitamin B12 1051mg daily.  Pass by lab to pick up stool kit.  If interested, check with pharmacy about new 2 shot shingles series (shingrix).  Schedule dentist appointment if not already done.  [place] copy of your advanced directives.  Return as needed or in 6 months for follow up visit.   Health Maintenance After Age 5668After age 76 you are at a higher risk for certain long-term diseases and infections as well as injuries from falls. Falls are a major cause of broken bones and head injuries in people who are older than age 76 Getting regular preventive care can help to keep you healthy and well. Preventive care includes getting regular testing and making lifestyle changes as recommended by your health care provider. Talk with your health care provider about: Which screenings and tests you should have. A screening is a test that checks for a disease when you have no symptoms. A diet and exercise plan that is right for you. What should I know about screenings and tests to prevent falls? Screening and testing are the best ways to find a health problem early. Early diagnosis and treatment give you the best chance of managing medical conditions that are common after age 76 Certain conditions and lifestyle choices may make you more likely to have a fall. Your health care provider may recommend: Regular vision checks. Poor vision and conditions such as cataracts can make you more likely to have a fall. If you wear glasses, make sure to get your prescription updated if your vision changes. Medicine review. Work with your health care provider to regularly review all of the medicines you are taking, including over-the-counter medicines. Ask your health care provider about any side effects that may make you more likely to have a fall. Tell your health care provider if any medicines that you take make you feel dizzy or sleepy. Osteoporosis screening.  Osteoporosis is a condition that causes the bones to get weaker. This can make the bones weak and cause them to break more easily. Blood pressure screening. Blood pressure changes and medicines to control blood pressure can make you feel dizzy. Strength and balance checks. Your health care provider may recommend certain tests to check your strength and balance while standing, walking, or changing positions. Foot health exam. Foot pain and numbness, as well as not wearing proper footwear, can make you more likely to have a fall. Depression screening. You may be more likely to have a fall if you have a fear of falling, feel emotionally low, or feel unable to do activities that you used to do. Alcohol use screening. Using too much alcohol can affect your balance and may make you more likely to have a fall. What actions can I take to lower my risk of falls? General instructions Talk with your health care provider about your risks for falling. Tell your health care provider if: You fall. Be sure to tell your health care provider about all falls, even ones that seem minor. You feel dizzy, sleepy, or off-balance. Take over-the-counter and prescription medicines only as told by your health care provider. These include any supplements. Eat a healthy diet and maintain a healthy weight. A healthy diet includes low-fat dairy products, low-fat (lean) meats, and fiber from whole grains, beans, and lots of fruits and vegetables. Home safety Remove any tripping hazards, such as rugs, cords, and clutter. Install safety equipment such as grab bars in  bathrooms and safety rails on stairs. Keep rooms and walkways well-lit. Activity  Follow a regular exercise program to stay fit. This will help you maintain your balance. Ask your health care provider what types of exercise are appropriate for you. If you need a cane or walker, use it as recommended by your health care provider. Wear supportive shoes that have  nonskid soles. Lifestyle Do not drink alcohol if your health care provider tells you not to drink. If you drink alcohol, limit how much you have: 0-1 drink a day for women. 0-2 drinks a day for men. Be aware of how much alcohol is in your drink. In the [place] one drink equals one typical bottle of beer (12 oz), one-half glass of wine (5 oz), or one shot of hard liquor (1 oz). Do not use any products that contain nicotine or tobacco, such as cigarettes and e-cigarettes. If you need help quitting, ask your health care provider. Summary Having a healthy lifestyle and getting preventive care can help to protect your health and wellness after age 92. Screening and testing are the best way to find a health problem early and help you avoid having a fall. Early diagnosis and treatment give you the best chance for managing medical conditions that are more common for people who are older than age 73. Falls are a major cause of broken bones and head injuries in people who are older than age 24. Take precautions to prevent a fall at home. Work with your health care provider to learn what changes you can make to improve your health and wellness and to prevent falls. This information is not intended to replace advice given to you by your health care provider. Make sure you discuss any questions you have with your health care provider. Document Revised: 03/24/2020 Document Reviewed: 12/31/2019 Elsevier Patient Education  2022 [employer].

## 2020-11-27 NOTE — Progress Notes (Signed)
Patient ID: Evan Morales, male    DOB: 24-Mar-1945, 75 y.o.   MRN: [id number]  This visit was conducted in person.  BP 140/86   Pulse 63   Temp 98 F (36.7 C) (Temporal)   Ht 6' (1.829 m)   Wt 207 lb 3 oz (94 kg)   SpO2 97%   BMI 28.10 kg/m    CC: CPE Subjective:   HPI: Evan Morales is a 75 y.o. male presenting on 11/27/2020 for Annual Exam (Prt 2. )   Saw health advisor 08/2020 for medicare wellness visit. Note reviewed.    No results found.  Flowsheet Row Clinical Support from 08/29/2020 in [place] [facility] [place]  PHQ-2 Total Score 0       Fall Risk  08/29/2020 08/27/2019 08/25/2018 08/06/2017 11/19/2016  Falls in the past year? 0 0 0 No No  Comment - - - - -  Number falls in past yr: 0 0 - - -  Injury with Fall? 0 0 - - -  Risk for fall due to : Medication side effect Medication side effect - - -  Follow up Falls evaluation completed;Falls prevention discussed Falls evaluation completed;Falls prevention discussed - - -    Known severe glaucoma sees Drs Susa Simmonds, Edilia Bo, Hensel regularly  B12 deficiency - last B12 shot was 03/2019.  Lab Results  Component Value Date   VITAMINB12 246 11/27/2020    Preventative: Colonoscopy 04/2005 with adenomatous polyp. Rec rpt 5 yrs. Rpt colonoscopy deferred after CVA, has decided to proceed with iFOBs in interim (1 positive 03/2012, one negative 2017). Declined colonoscopy and cologuard last year. iFOB 08/2018, 08/2019 normal.  Prostate - always normal. Improved nocturia - doesn't drink fluids after 6pm. Desires to stop screening.  Lung cancer screening - not eligible Flu yearly. [place] 03/2019, 03/2019, booster 10/2019, 07/2020 Pneumovax 2013, prevnar-13 2017 Td 2011.  zostavax 06/2012.  shingrix - discussed, declines.  Advanced planning - has this at home. HCPOA is wife and/or son. Does not want prolonged life support. If doesn't have capacity to make decisions would not want artificial  prolongation of life. Wants to [facility] body to [facility]. Asked to bring [place] copy.  Seat belt use discussed.  Sunscreen use discussed. No changing moles on skin.  Non smoker  Alcohol - none  Dentist - has not see - upcoming appt Eye exam - sees regularly (see above)  Bowel - constipation better, treating with metamucil and PRN miralax  Bladder - no incontinence   Caffeine: 2-3 cups coffee/day  Lives with wife. (grown children)  Occupation: retired, was Occupational hygienist  Activity: walking daily at least 1/2 mi  Diet: good water, fruits/vegetables daily      Relevant past medical, surgical, family and social history reviewed and updated as indicated. Interim medical history since our last visit reviewed. Allergies and medications reviewed and updated. Outpatient Medications Prior to Visit  Medication Sig Dispense Refill   atorvastatin (LIPITOR) 40 MG tablet TAKE 1 TABLET BY MOUTH EVERY DAY IN THE EVENING 90 tablet 0   brimonidine (ALPHAGAN) 0.2 % ophthalmic solution PLACE 1 DROP INTO THE RIGHT EYE 2 TIMES DAILY.     clopidogrel (PLAVIX) 75 MG tablet TAKE 1 TABLET BY MOUTH EVERY DAY 90 tablet 1   Cyanocobalamin (B-12) 1000 MCG SUBL Place 1 tablet under the tongue daily. 30 each    diclofenac sodium (VOLTAREN) 1 % GEL Apply 4 g topically 4 (four) times daily as needed.  500 g 6   dorzolamide-timolol (COSOPT) 22.3-6.8 MG/ML ophthalmic solution Place 1 drop into the right eye 2 (two) times daily.     meclizine (ANTIVERT) 12.5 MG tablet Take 1 tablet (12.5 mg total) by mouth 3 (three) times daily as needed for dizziness. 30 tablet 0   mupirocin ointment (BACTROBAN) 2 % Apply topically 3 (three) times daily as needed.     polyethylene glycol powder (GAVILAX) 17 GM/SCOOP powder TAKE 1/2 TO A WHOLE CAPFUL MIXED IN 8 OZ FLUID AS NEEDED FOR CONSTIPATION, HOLD FOR DIARRHEA. 255 g 3   psyllium (METAMUCIL SMOOTH TEXTURE) 58.6 % powder Take 1 packet by mouth daily.     quinapril (ACCUPRIL) 20 MG  tablet TAKE 1 TABLET BY MOUTH EVERY DAY 90 tablet 0   No facility-administered medications prior to visit.     Per HPI unless specifically indicated in ROS section below Review of Systems  Constitutional:  Negative for activity change, appetite change, chills, fatigue, fever and unexpected weight change.  HENT:  Negative for hearing loss.   Eyes:  Negative for visual disturbance.  Respiratory:  Negative for cough, chest tightness, shortness of breath and wheezing.   Cardiovascular:  Negative for chest pain, palpitations and leg swelling.  Gastrointestinal:  Negative for abdominal distention, abdominal pain, blood in stool, constipation, diarrhea, nausea and vomiting.  Genitourinary:  Negative for difficulty urinating and hematuria.  Musculoskeletal:  Negative for arthralgias, myalgias and neck pain.  Skin:  Negative for rash.  Neurological:  Negative for dizziness, seizures, syncope and headaches.  Hematological:  Negative for adenopathy. Does not bruise/bleed easily.  Psychiatric/Behavioral:  Negative for dysphoric mood. The patient is not nervous/anxious.    Objective:  BP 140/86   Pulse 63   Temp 98 F (36.7 C) (Temporal)   Ht 6' (1.829 m)   Wt 207 lb 3 oz (94 kg)   SpO2 97%   BMI 28.10 kg/m   Wt Readings from Last 3 Encounters:  11/27/20 207 lb 3 oz (94 kg)  03/07/20 211 lb 3 oz (95.8 kg)  11/23/19 203 lb 4 oz (92.2 kg)      Physical Exam Vitals and nursing note reviewed.  Constitutional:      General: He is not in acute distress.    Appearance: Normal appearance. He is well-developed. He is not ill-appearing.     Comments: Ambulates with cane  HENT:     Head: Normocephalic and atraumatic.     Right Ear: Hearing, tympanic membrane, ear canal and external ear normal.     Left Ear: Hearing, tympanic membrane, ear canal and external ear normal.  Eyes:     General: No scleral icterus.    Extraocular Movements: Extraocular movements intact.     Conjunctiva/sclera:  Conjunctivae normal.     Pupils: Pupils are equal, round, and reactive to light.  Neck:     Thyroid: No thyroid mass or thyromegaly.     Vascular: No carotid bruit.  Cardiovascular:     Rate and Rhythm: Normal rate and regular rhythm.     Pulses: Normal pulses.          Radial pulses are 2+ on the right side and 2+ on the left side.     Heart sounds: Normal heart sounds. No murmur heard. Pulmonary:     Effort: Pulmonary effort is normal. No respiratory distress.     Breath sounds: Normal breath sounds. No wheezing, rhonchi or rales.  Abdominal:     General: Bowel sounds are  normal. There is no distension.     Palpations: Abdomen is soft. There is no mass.     Tenderness: There is no abdominal tenderness. There is no guarding or rebound.     Hernia: No hernia is present.  Musculoskeletal:        General: Normal range of motion.     Cervical back: Normal range of motion and neck supple.     Right lower leg: No edema.     Left lower leg: No edema.  Lymphadenopathy:     Cervical: No cervical adenopathy.  Skin:    General: Skin is warm and dry.     Findings: No rash.  Neurological:     General: No focal deficit present.     Mental Status: He is alert and oriented to person, place, and time.  Psychiatric:        Mood and Affect: Mood normal.        Behavior: Behavior normal.        Thought Content: Thought content normal.        Judgment: Judgment normal.      Results for orders placed or performed in visit on 11/27/20  Vitamin B12  Result Value Ref Range   Vitamin B-12 246 211 - 911 pg/mL  Hemoglobin A1c  Result Value Ref Range   Hgb A1c MFr Bld 5.9 4.6 - 6.5 %  Comprehensive metabolic panel  Result Value Ref Range   Sodium 138 135 - 145 mEq/L   Potassium 4.0 3.5 - 5.1 mEq/L   Chloride 103 96 - 112 mEq/L   CO2 28 19 - 32 mEq/L   Glucose, Bld 107 (H) 70 - 99 mg/dL   BUN 16 6 - 23 mg/dL   Creatinine, Ser 0.92 0.40 - 1.50 mg/dL   Total Bilirubin 1.8 (H) 0.2 - 1.2 mg/dL    Alkaline Phosphatase 75 39 - 117 U/L   AST 16 0 - 37 U/L   ALT 16 0 - 53 U/L   Total Protein 7.0 6.0 - 8.3 g/dL   Albumin 4.3 3.5 - 5.2 g/dL   GFR 81.09 >60.00 mL/min   Calcium 9.4 8.4 - 10.5 mg/dL  Lipid panel  Result Value Ref Range   Cholesterol 91 0 - 200 mg/dL   Triglycerides 46.0 0.0 - 149.0 mg/dL   HDL 44.70 >39.00 mg/dL   VLDL 9.2 0.0 - 40.0 mg/dL   LDL Cholesterol 37 0 - 99 mg/dL   Total CHOL/HDL Ratio 2    NonHDL 46.04     Assessment & Plan:  This visit occurred during the SARS-CoV-2 public health emergency.  Safety protocols were in place, including screening questions prior to the visit, additional usage of staff PPE, and extensive cleaning of exam room while observing appropriate contact time as indicated for disinfecting solutions.   Problem List Items Addressed This Visit     Advanced care planning/counseling discussion (Chronic)    Advanced planning - has this at home. HCPOA is wife and/or son. Does not want prolonged life support. If doesn't have capacity to make decisions would not want artificial prolongation of life. Wants to donate body to [facility]. Asked to bring [place] copy.       Health maintenance examination - Primary (Chronic)    Preventative protocols reviewed and updated unless pt declined. Discussed healthy diet and lifestyle.       HYPERCHOLESTEROLEMIA    Chronic on atorvastatin. Update FLP. The ASCVD Risk score (Arnett DK, et al., 2019) failed  to calculate for the following reasons:   The valid total cholesterol range is 130 to 320 mg/dL       Essential hypertension    Chronic, stable on quinapril - continue.       Prediabetes    Update A1c.      History of cerebrovascular accident (CVA) with residual deficit    Continue statin and plavix      Vision loss, bilateral    Followed closely by glaucoma doctor      Bilateral carotid artery stenosis    Stable carotid US 08/2019. No bruits appreciated. Continue to monitor.        Vitamin B12 deficiency    B12 shot today. Update levels. Encouraged regular oral b12 intake if cannot come in for monthly injections.       Other Visit Diagnoses     Special screening for malignant neoplasms, colon       Relevant Orders   Fecal occult blood, imunochemical        Meds ordered this encounter  Medications   cyanocobalamin ((VITAMIN B-12)) injection 1,000 mcg    Orders Placed This Encounter  Procedures   Fecal occult blood, imunochemical    Standing Status:   Future    Standing Expiration Date:   11/27/2021      Patient instructions: Labs today  B12 shot today  Start taking oral vitamin B12 1048mg daily.  Pass by lab to pick up stool kit.  If interested, check with pharmacy about new 2 shot shingles series (shingrix).  Schedule dentist appointment if not already done.  Bring [place] copy of your advanced directives.  Return as needed or in 6 months for follow up visit.   Follow up plan: Return in about 6 months (around 05/27/2021) for follow up visit.  JRia Bush MD

## 2020-11-28 NOTE — Assessment & Plan Note (Signed)
Chronic, stable on quinapril - continue.

## 2020-11-28 NOTE — Assessment & Plan Note (Signed)
Stable carotid US 08/2019. No bruits appreciated. Continue to monitor.

## 2020-11-28 NOTE — Assessment & Plan Note (Signed)
B12 shot today. Update levels. Encouraged regular oral b12 intake if cannot come in for monthly injections.

## 2020-11-28 NOTE — Assessment & Plan Note (Signed)
Advanced planning - has this at home. HCPOA is wife and/or son. Does not want prolonged life support. If doesn't have capacity to make decisions would not want artificial prolongation of life. Wants to donate body to [facility]. Asked to bring [place] copy.

## 2020-11-28 NOTE — Assessment & Plan Note (Signed)
Continue statin and plavix

## 2020-11-28 NOTE — Assessment & Plan Note (Addendum)
Chronic on atorvastatin. Update FLP. The ASCVD Risk score (Arnett DK, et al., 2019) failed to calculate for the following reasons:   The valid total cholesterol range is 130 to 320 mg/dL

## 2020-11-28 NOTE — Assessment & Plan Note (Signed)
Update A1c ?

## 2020-11-28 NOTE — Assessment & Plan Note (Signed)
Followed closely by glaucoma doctor

## 2020-12-01 ENCOUNTER — Other Ambulatory Visit: Payer: Self-pay | Admitting: Family Medicine

## 2020-12-15 DIAGNOSIS — H40002 Preglaucoma, unspecified, left eye: Secondary | ICD-10-CM | POA: Diagnosis not present

## 2020-12-15 DIAGNOSIS — H4031X4 Glaucoma secondary to eye trauma, right eye, indeterminate stage: Secondary | ICD-10-CM | POA: Diagnosis not present

## 2020-12-15 DIAGNOSIS — H4031X3 Glaucoma secondary to eye trauma, right eye, severe stage: Secondary | ICD-10-CM | POA: Diagnosis not present

## 2020-12-28 ENCOUNTER — Other Ambulatory Visit: Payer: Self-pay

## 2020-12-28 ENCOUNTER — Telehealth: Payer: Self-pay

## 2020-12-28 ENCOUNTER — Ambulatory Visit (INDEPENDENT_AMBULATORY_CARE_PROVIDER_SITE_OTHER): Payer: PPO

## 2020-12-28 DIAGNOSIS — E538 Deficiency of other specified B group vitamins: Secondary | ICD-10-CM | POA: Diagnosis not present

## 2020-12-28 MED ORDER — CYANOCOBALAMIN 1000 MCG/ML IJ SOLN
1000.0000 ug | Freq: Once | INTRAMUSCULAR | Status: AC
  Administered 2020-12-28: 1000 ug via INTRAMUSCULAR

## 2020-12-28 NOTE — Telephone Encounter (Signed)
Placed form in Dr. G's box.  

## 2020-12-28 NOTE — Progress Notes (Signed)
.  Per orders of Dr. Damita Dunnings, in the absence of Dr. Danise Mina, monthly injection of B12 given by Loreen Freud. Patient tolerated injection well.

## 2020-12-28 NOTE — Telephone Encounter (Signed)
Pt was in office for his B12 shot and requested that Dr. Darnell Level fill out a DMV handicap placard for him. His wife does all the driving now but he doesn't walk well. Pt states that they will pick form up from [place] location when its ready.

## 2020-12-29 NOTE — Telephone Encounter (Signed)
Spoke with pt notifying him form is ready to pick up at [facility] office and top portion will need to be completed. Pt verbalizes understanding and asks that form be mailed.  Mailed form to pt.

## 2020-12-29 NOTE — Telephone Encounter (Addendum)
Filled and in Lisa's box.  He will need to fill out top part

## 2021-01-13 ENCOUNTER — Other Ambulatory Visit: Payer: Self-pay | Admitting: Family Medicine

## 2021-01-28 ENCOUNTER — Other Ambulatory Visit: Payer: Self-pay | Admitting: Family Medicine

## 2021-03-08 ENCOUNTER — Ambulatory Visit: Payer: PPO | Admitting: Specialist

## 2021-03-14 ENCOUNTER — Other Ambulatory Visit: Payer: Self-pay

## 2021-03-14 ENCOUNTER — Ambulatory Visit (INDEPENDENT_AMBULATORY_CARE_PROVIDER_SITE_OTHER): Payer: PPO | Admitting: Specialist

## 2021-03-14 ENCOUNTER — Encounter: Payer: Self-pay | Admitting: Specialist

## 2021-03-14 ENCOUNTER — Ambulatory Visit: Payer: Self-pay

## 2021-03-14 VITALS — BP 126/84 | HR 79 | Ht 72.0 in | Wt 207.0 lb

## 2021-03-14 DIAGNOSIS — M1712 Unilateral primary osteoarthritis, left knee: Secondary | ICD-10-CM | POA: Diagnosis not present

## 2021-03-14 DIAGNOSIS — G8929 Other chronic pain: Secondary | ICD-10-CM

## 2021-03-14 DIAGNOSIS — M25562 Pain in left knee: Secondary | ICD-10-CM

## 2021-03-14 MED ORDER — DICLOFENAC SODIUM 1 % EX GEL: 4.0000 g | Freq: Four times a day (QID) | CUTANEOUS | 2 refills | Status: AC

## 2021-03-14 MED ORDER — CELECOXIB 200 MG PO CAPS: 200.0000 mg | ORAL_CAPSULE | Freq: Two times a day (BID) | ORAL | 2 refills | Status: AC

## 2021-03-14 NOTE — Progress Notes (Signed)
Office Visit Note   Patient: Evan Morales           Date of Birth: 1945/02/13           MRN: [id number] Visit Date: 03/14/2021              Requested by: Ria Bush, MD [address] [place],  [place] [address] PCP: Ria Bush, MD   Assessment & Plan: Visit Diagnoses:  1. Unilateral primary osteoarthritis, left knee   2. Chronic pain of left knee     Plan: Plan: Knee is suffering from osteoarthritis, only real proven treatments are Weight loss, NSIADs like celebrex and exercise. Well padded shoes help. Ice the knee that is suffering from osteoarthritis, only real proven treatments are Well padded shoes help. Ice the knee 2-3 times a day 15-20 mins at a time.3 times a day 15-20 mins at a time. Hot showers in the AM.  Injection with steroid may be of benefit. Hemp CBD capsules, amazon.com 5,000-7,000 mg per bottle, 60 capsules per bottle, take one capsule twice a day. Cane in the left hand to use with left leg weight bearing. Follow-Up Instructions: No follow-ups on file.    Follow-Up Instructions: Return in about 4 weeks (around 04/11/2021).   Orders:  Orders Placed This Encounter  Procedures   XR Knee 1-2 Views Left   No orders of the defined types were placed in this encounter.     Procedures: No procedures performed   Clinical Data: No additional findings.   Subjective: Chief Complaint  Patient presents with   Left Knee - Pain    76 year old male with history of right total knee replacement by Dr. Sharol Given? 7-8 year ago and then had a stroke that affected the right side. He has fallen in the past and has had pain in the left knee since then. The left knee is not giving out on him and he has pain since the fall. He has pain with weight and with walking. He has no pain at night and when off the left knee. No numbness or paresthesias.   Review of Systems  Constitutional: Negative.   HENT: Negative.    Eyes: Negative.   Respiratory:  Negative.    Cardiovascular: Negative.   Gastrointestinal: Negative.   Endocrine: Negative.   Genitourinary: Negative.   Musculoskeletal: Negative.   Skin: Negative.   Allergic/Immunologic: Negative.   Neurological: Negative.   Hematological: Negative.   Psychiatric/Behavioral: Negative.      Objective: Vital Signs: BP 126/84 (BP Location: Right Arm, Patient Position: Sitting, Cuff Size: Normal)    Pulse 79    Ht 6' (1.829 m)    Wt 207 lb (93.9 kg)    BMI 28.07 kg/m   Physical Exam  Ortho Exam  Specialty Comments:  No specialty comments available.  Imaging: No results found.   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   CVA (cerebral vascular accident) ([place]) 03/27/2012   left occipital lobe - acute or subacute infarction. No hemorrhage. Residual numbness R index and middle finger and R periorally, residual memory loss,  worsened L vision   Degenerative arthritis    Dyslipidemia    Dysthymia    History of colon polyps 05/15/2005   tubular adenomas polyps   Hypertension    Legally blind    Pre-diabetes     Family History  Problem Relation Age of Onset   Cancer Mother        male   Alzheimer's disease Mother    Glaucoma Father    Coronary artery disease Neg Hx    Stroke Neg Hx    Diabetes Neg Hx    Colon cancer Neg Hx     Past Surgical History:  Procedure Laterality Date   CATARACT EXTRACTION  2013   left   CORNEAL TRANSPLANT  1988   left   ESOPHAGOGASTRODUODENOSCOPY  05/24/2005   mild erythema in antrum   REPLACEMENT TOTAL KNEE  02/07   right, osteoarthritis   Social History   Occupational History   Occupation: Development worker, international aid: [employer]  Tobacco Use   Smoking status: Never   Smokeless tobacco: Former    Types: Nurse, children's Use: Never used  Substance and Sexual Activity   Alcohol use: No    Alcohol/week: 0.0 standard drinks   Drug use: No   Sexual activity: Not Currently

## 2021-03-14 NOTE — Patient Instructions (Addendum)
Plan: Knee is suffering from osteoarthritis, only real proven treatments are Weight loss, NSIADs like celebrex and exercise. Well padded shoes help. Ice the knee that is suffering from osteoarthritis, only real proven treatments are Well padded shoes help. Ice the knee 2-3 times a day 15-20 mins at a time.-3 times a day 15-20 mins at a time. Hot showers in the AM.  Injection with steroid may be of benefit. Hemp CBD capsules, amazon.com 5,000-7,000 mg per bottle, 60 capsules per bottle, take one capsule twice a day. Cane in the left hand to use with left leg weight bearing. Follow-Up Instructions: No follow-ups on file.

## 2021-04-30 ENCOUNTER — Telehealth: Payer: Self-pay | Admitting: *Deleted

## 2021-04-30 DIAGNOSIS — L609 Nail disorder, unspecified: Secondary | ICD-10-CM

## 2021-04-30 NOTE — Telephone Encounter (Signed)
Podiatry referral placed. Thanks.  ?

## 2021-04-30 NOTE — Telephone Encounter (Signed)
Pt is calling asking for a referral to a podiatrist, pt said his toe nails "have gotten out of control" they are thick and getting to long, they are not hurting but if he doesn't get them checked he is afraid that they will cause him pain soon. Pt said he is okay seeing someone in [place] or [place], I advise pt if PCP is okay putting referral in it may take 2 weeks before he hears back but if not to let us know, but if PCP has any questions then his assistant will f/u with him. ?

## 2021-05-02 ENCOUNTER — Emergency Department (HOSPITAL_COMMUNITY): Payer: PPO

## 2021-05-02 ENCOUNTER — Emergency Department (HOSPITAL_COMMUNITY)
Admission: EM | Admit: 2021-05-02 | Discharge: 2021-05-02 | Disposition: A | Discharge status: Home / Self Care | Payer: PPO | Attending: Emergency Medicine | Admitting: Emergency Medicine

## 2021-05-02 ENCOUNTER — Encounter (HOSPITAL_COMMUNITY): Payer: Self-pay

## 2021-05-02 ENCOUNTER — Other Ambulatory Visit: Payer: Self-pay

## 2021-05-02 DIAGNOSIS — R410 Disorientation, unspecified: Secondary | ICD-10-CM | POA: Diagnosis not present

## 2021-05-02 DIAGNOSIS — Z96651 Presence of right artificial knee joint: Secondary | ICD-10-CM | POA: Diagnosis not present

## 2021-05-02 DIAGNOSIS — Z86010 Personal history of colonic polyps: Secondary | ICD-10-CM | POA: Diagnosis not present

## 2021-05-02 DIAGNOSIS — R4182 Altered mental status, unspecified: Secondary | ICD-10-CM | POA: Diagnosis not present

## 2021-05-02 DIAGNOSIS — R600 Localized edema: Secondary | ICD-10-CM | POA: Diagnosis not present

## 2021-05-02 DIAGNOSIS — R531 Weakness: Secondary | ICD-10-CM

## 2021-05-02 DIAGNOSIS — E860 Dehydration: Secondary | ICD-10-CM | POA: Diagnosis not present

## 2021-05-02 DIAGNOSIS — Z7902 Long term (current) use of antithrombotics/antiplatelets: Secondary | ICD-10-CM | POA: Insufficient documentation

## 2021-05-02 DIAGNOSIS — I10 Essential (primary) hypertension: Secondary | ICD-10-CM | POA: Diagnosis not present

## 2021-05-02 LAB — CBC WITH DIFFERENTIAL/PLATELET
Abs Immature Granulocytes: 0.01 10*3/uL (ref 0.00–0.07)
Basophils Absolute: 0 10*3/uL (ref 0.0–0.1)
Basophils Relative: 0 %
Eosinophils Absolute: 0.1 10*3/uL (ref 0.0–0.5)
Eosinophils Relative: 1 %
HCT: 42.5 % (ref 39.0–52.0)
Hemoglobin: 14.4 g/dL (ref 13.0–17.0)
Immature Granulocytes: 0 %
Lymphocytes Relative: 19 %
Lymphs Abs: 1.4 10*3/uL (ref 0.7–4.0)
MCH: 33.3 pg (ref 26.0–34.0)
MCHC: 33.9 g/dL (ref 30.0–36.0)
MCV: 98.4 fL (ref 80.0–100.0)
Monocytes Absolute: 0.6 10*3/uL (ref 0.1–1.0)
Monocytes Relative: 9 %
Neutro Abs: 5 10*3/uL (ref 1.7–7.7)
Neutrophils Relative %: 71 %
Platelets: 200 10*3/uL (ref 150–400)
RBC: 4.32 MIL/uL (ref 4.22–5.81)
RDW: 12.1 % (ref 11.5–15.5)
WBC: 7.1 10*3/uL (ref 4.0–10.5)
nRBC: 0 % (ref 0.0–0.2)

## 2021-05-02 LAB — TSH: TSH: 0.986 u[IU]/mL (ref 0.350–4.500)

## 2021-05-02 LAB — URINALYSIS, ROUTINE W REFLEX MICROSCOPIC
Bilirubin Urine: NEGATIVE
Glucose, UA: NEGATIVE mg/dL
Hgb urine dipstick: NEGATIVE
Ketones, ur: 20 mg/dL — AB
Leukocytes,Ua: NEGATIVE
Nitrite: NEGATIVE
Protein, ur: NEGATIVE mg/dL
Specific Gravity, Urine: 1.027 (ref 1.005–1.030)
pH: 5 (ref 5.0–8.0)

## 2021-05-02 LAB — COMPREHENSIVE METABOLIC PANEL
ALT: 15 U/L (ref 0–44)
AST: 17 U/L (ref 15–41)
Albumin: 4 g/dL (ref 3.5–5.0)
Alkaline Phosphatase: 74 U/L (ref 38–126)
Anion gap: 6 (ref 5–15)
BUN: 25 mg/dL — ABNORMAL HIGH (ref 8–23)
CO2: 24 mmol/L (ref 22–32)
Calcium: 9.3 mg/dL (ref 8.9–10.3)
Chloride: 110 mmol/L (ref 98–111)
Creatinine, Ser: 1.05 mg/dL (ref 0.61–1.24)
GFR, Estimated: >60 mL/min (ref >60)
Glucose, Bld: 115 mg/dL — ABNORMAL HIGH (ref 70–99)
Potassium: 4.3 mmol/L (ref 3.5–5.1)
Sodium: 140 mmol/L (ref 135–145)
Total Bilirubin: 2.1 mg/dL — ABNORMAL HIGH (ref 0.3–1.2)
Total Protein: 6.7 g/dL (ref 6.5–8.1)

## 2021-05-02 LAB — BRAIN NATRIURETIC PEPTIDE: B Natriuretic Peptide: 74.2 pg/mL (ref 0.0–100.0)

## 2021-05-02 LAB — TROPONIN I (HIGH SENSITIVITY): Troponin I (High Sensitivity): 9 ng/L (ref <18)

## 2021-05-02 MED ORDER — SODIUM CHLORIDE 0.9 % IV BOLUS
500.0000 mL | Freq: Once | INTRAVENOUS | Status: AC
  Administered 2021-05-02: 500 mL via INTRAVENOUS

## 2021-05-02 NOTE — ED Triage Notes (Signed)
Pt BIB [facility] from home. Pt with increasing weakness and confusion x 1 week. At baseline pt is able to walk with a walker, but today pt unable to stand without max assistance.  ? ?EMS Vitals: ?136/100 ?HR 78 ?RR 16 ?SpO2 96% on R/A ?CBG 106 ?

## 2021-05-02 NOTE — Progress Notes (Signed)
Transition of Care [facility]) - Emergency Department Mini Assessment ? ? ?Patient Details  ?Name: Evan Morales ?MRN: [id number] ?Date of Birth: Mar 09, 1945 ? ?Transition of Care (TOC) CM/SW Contact:    ?Lissandra Keil, LCSW ?Phone Number: ?05/02/2021, 10:49 PM ? ? ?Clinical Narrative: ? ? ? ?ED Mini Assessment: ?What brought you to the Emergency Department? : (P) weakness ? ?Barriers to Discharge: (P) No Barriers Identified ? ?Barrier interventions: (P) CSWs met with Pt and son, Saralyn Pilar at bedside. Pt lives with at home with wife. Wifes family lives next door to them. Pt reports increased weakness and unsteadiness on feet. Pt ambulates with walker at baseline. Pt states that he would like to have [facility] services and receive PT in home. CSWs explained parameters of HH services and explained that in-home care is an out-of-pocket expense. CSW counseled Pt and son to apply for Medicaid. Son said that he would be able to start application process. Son states that any decisions for [facility] services would need to be discussed with wife's daughter-in-law, Reino Bellis.  CSW provided son with number for Reino Bellis to call RN Case Manager in the morning to go over choices for [facility] services. ? ?Means of departure: (P) Not know ? ?Interventions which prevented an admission or readmission: Home Health Consult or Services ? ? ? ?Patient Contact and Communications ?  ?  ?  ? ,     ?  ?  ? ?  ?CMS Medicare.gov Compare Post Acute Care list provided to:: (P) Patient ?  ? ?Admission diagnosis:  Weakness x1 week ?Patient Active Problem List  ? Diagnosis Date Noted  ? 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 Chronic pain of both shoulders 01/30/2016  ? Vitamin B12 deficiency 07/27/2015  ? Overweight with body mass index (BMI) 25.0-29.9 07/27/2015  ? 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 Advanced care  planning/counseling discussion 07/26/2014  ? Health maintenance examination 07/26/2014  ? Dysthymia 07/26/2014  ? 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 Medicare annual wellness visit, subsequent 07/13/2012  ? Constipation 04/09/2012  ? Vision loss, bilateral 04/06/2012  ? Bilateral carotid artery stenosis 04/06/2012  ? History of cerebrovascular accident (CVA) with residual deficit 03/27/2012  ? Prediabetes 07/05/2010  ? HYPERCHOLESTEROLEMIA 05/04/2009  ? ECZEMA 05/05/2007  ? Essential hypertension 05/13/2006  ? COLONIC POLYPS, ADENOMATOUS 05/15/2005  ? ?PCP:  Ria Bush, MD ?Pharmacy:   ?[facility] - [place], [place] - [address], [address] ?[address], [address] ?[place] [address] ?Phone: [phone] Fax: [phone] ? ?[id number] [address]?[id number]?Phone: [phone]: [phone]?  ?

## 2021-05-02 NOTE — ED Notes (Signed)
Pt ambulated in room with a walker. Pt with unsteady, shuffling gait. Walks on the balls of his feet.  ?

## 2021-05-02 NOTE — ED Notes (Signed)
Patient transported to CT 

## 2021-05-02 NOTE — ED Notes (Signed)
Pt provided discharge instructions and prescription information. Pt was given the opportunity to ask questions and questions were answered. Discharge signature not obtained in the setting of the COVID-19 pandemic in order to reduce high touch surfaces.  ° °
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
## 2021-05-02 NOTE — Discharge Instructions (Addendum)
The home health face-to-face has been put in.  Follow-up with the information that you are given by transitions of care ?

## 2021-05-03 ENCOUNTER — Telehealth: Payer: Self-pay | Admitting: Surgery

## 2021-05-03 NOTE — Telephone Encounter (Signed)
ED RNCM sent referral to [facility] with [facility] referral accepted. Will update patient and family.  ?

## 2021-05-06 ENCOUNTER — Encounter (HOSPITAL_COMMUNITY): Payer: Self-pay

## 2021-05-06 ENCOUNTER — Emergency Department (HOSPITAL_COMMUNITY)
Admission: EM | Admit: 2021-05-06 | Discharge: 2021-05-08 | Disposition: A | Discharge status: Home / Self Care | Payer: PPO | Attending: Emergency Medicine | Admitting: Emergency Medicine

## 2021-05-06 ENCOUNTER — Other Ambulatory Visit: Payer: Self-pay

## 2021-05-06 DIAGNOSIS — I10 Essential (primary) hypertension: Secondary | ICD-10-CM | POA: Diagnosis not present

## 2021-05-06 DIAGNOSIS — R339 Retention of urine, unspecified: Secondary | ICD-10-CM | POA: Insufficient documentation

## 2021-05-06 DIAGNOSIS — R39198 Other difficulties with micturition: Secondary | ICD-10-CM | POA: Insufficient documentation

## 2021-05-06 DIAGNOSIS — R5383 Other fatigue: Secondary | ICD-10-CM | POA: Insufficient documentation

## 2021-05-06 DIAGNOSIS — Z79899 Other long term (current) drug therapy: Secondary | ICD-10-CM | POA: Diagnosis not present

## 2021-05-06 DIAGNOSIS — Z8673 Personal history of transient ischemic attack (TIA), and cerebral infarction without residual deficits: Secondary | ICD-10-CM | POA: Insufficient documentation

## 2021-05-06 DIAGNOSIS — R300 Dysuria: Secondary | ICD-10-CM | POA: Diagnosis not present

## 2021-05-06 DIAGNOSIS — Z96651 Presence of right artificial knee joint: Secondary | ICD-10-CM | POA: Insufficient documentation

## 2021-05-06 DIAGNOSIS — R531 Weakness: Secondary | ICD-10-CM | POA: Insufficient documentation

## 2021-05-06 DIAGNOSIS — Z7902 Long term (current) use of antithrombotics/antiplatelets: Secondary | ICD-10-CM | POA: Insufficient documentation

## 2021-05-06 DIAGNOSIS — G4489 Other headache syndrome: Secondary | ICD-10-CM | POA: Diagnosis not present

## 2021-05-06 DIAGNOSIS — R55 Syncope and collapse: Secondary | ICD-10-CM | POA: Diagnosis not present

## 2021-05-06 LAB — CBC WITH DIFFERENTIAL/PLATELET
Abs Immature Granulocytes: 0.04 10*3/uL (ref 0.00–0.07)
Basophils Absolute: 0 10*3/uL (ref 0.0–0.1)
Basophils Relative: 0 %
Eosinophils Absolute: 0.1 10*3/uL (ref 0.0–0.5)
Eosinophils Relative: 1 %
HCT: 45.7 % (ref 39.0–52.0)
Hemoglobin: 15.4 g/dL (ref 13.0–17.0)
Immature Granulocytes: 0 %
Lymphocytes Relative: 10 %
Lymphs Abs: 1.1 10*3/uL (ref 0.7–4.0)
MCH: 32.8 pg (ref 26.0–34.0)
MCHC: 33.7 g/dL (ref 30.0–36.0)
MCV: 97.4 fL (ref 80.0–100.0)
Monocytes Absolute: 0.7 10*3/uL (ref 0.1–1.0)
Monocytes Relative: 7 %
Neutro Abs: 8.9 10*3/uL — ABNORMAL HIGH (ref 1.7–7.7)
Neutrophils Relative %: 82 %
Platelets: 200 10*3/uL (ref 150–400)
RBC: 4.69 MIL/uL (ref 4.22–5.81)
RDW: 12.1 % (ref 11.5–15.5)
WBC: 10.8 10*3/uL — ABNORMAL HIGH (ref 4.0–10.5)
nRBC: 0 % (ref 0.0–0.2)

## 2021-05-06 LAB — URINALYSIS, ROUTINE W REFLEX MICROSCOPIC
Bacteria, UA: NONE SEEN
Bilirubin Urine: NEGATIVE
Glucose, UA: NEGATIVE mg/dL
Ketones, ur: 5 mg/dL — AB
Leukocytes,Ua: NEGATIVE
Nitrite: NEGATIVE
Protein, ur: NEGATIVE mg/dL
Specific Gravity, Urine: 1.015 (ref 1.005–1.030)
pH: 7 (ref 5.0–8.0)

## 2021-05-06 LAB — COMPREHENSIVE METABOLIC PANEL
ALT: 18 U/L (ref 0–44)
AST: 26 U/L (ref 15–41)
Albumin: 4.1 g/dL (ref 3.5–5.0)
Alkaline Phosphatase: 73 U/L (ref 38–126)
Anion gap: 10 (ref 5–15)
BUN: 11 mg/dL (ref 8–23)
CO2: 23 mmol/L (ref 22–32)
Calcium: 9 mg/dL (ref 8.9–10.3)
Chloride: 106 mmol/L (ref 98–111)
Creatinine, Ser: 0.82 mg/dL (ref 0.61–1.24)
GFR, Estimated: >60 mL/min (ref >60)
Glucose, Bld: 103 mg/dL — ABNORMAL HIGH (ref 70–99)
Potassium: 3.7 mmol/L (ref 3.5–5.1)
Sodium: 139 mmol/L (ref 135–145)
Total Bilirubin: 2.4 mg/dL — ABNORMAL HIGH (ref 0.3–1.2)
Total Protein: 6.6 g/dL (ref 6.5–8.1)

## 2021-05-06 LAB — CBG MONITORING, ED: Glucose-Capillary: 107 mg/dL — ABNORMAL HIGH (ref 70–99)

## 2021-05-06 MED ORDER — QUINAPRIL HCL 10 MG PO TABS
20.0000 mg | ORAL_TABLET | Freq: Every day | ORAL | Status: DC
  Administered 2021-05-07: 20 mg via ORAL
  Filled 2021-05-06 (×4): qty 2

## 2021-05-06 MED ORDER — BRIMONIDINE TARTRATE 0.2 % OP SOLN
1.0000 [drp] | Freq: Two times a day (BID) | OPHTHALMIC | Status: DC
  Administered 2021-05-07 (×2): 1 [drp] via OPHTHALMIC
  Filled 2021-05-06: qty 5

## 2021-05-06 MED ORDER — LISINOPRIL 20 MG PO TABS
20.0000 mg | ORAL_TABLET | Freq: Every day | ORAL | Status: DC
  Administered 2021-05-06: 20 mg via ORAL
  Filled 2021-05-06: qty 1

## 2021-05-06 MED ORDER — VITAMIN B-12 1000 MCG PO TABS
1000.0000 ug | ORAL_TABLET | Freq: Every day | ORAL | Status: DC
  Administered 2021-05-06 – 2021-05-08 (×3): 1000 ug via ORAL
  Filled 2021-05-06 (×3): qty 1

## 2021-05-06 MED ORDER — PSYLLIUM 95 % PO PACK
1.0000 | PACK | Freq: Every day | ORAL | Status: DC
  Administered 2021-05-06 – 2021-05-07 (×2): 1 via ORAL
  Filled 2021-05-06 (×3): qty 1

## 2021-05-06 MED ORDER — DICLOFENAC SODIUM 1 % EX GEL
4.0000 g | Freq: Four times a day (QID) | CUTANEOUS | Status: DC
  Administered 2021-05-07: 4 g via TOPICAL
  Filled 2021-05-06: qty 100

## 2021-05-06 MED ORDER — DORZOLAMIDE HCL-TIMOLOL MAL 2-0.5 % OP SOLN
1.0000 [drp] | Freq: Two times a day (BID) | OPHTHALMIC | Status: DC
  Administered 2021-05-07 (×2): 1 [drp] via OPHTHALMIC
  Filled 2021-05-06: qty 10

## 2021-05-06 MED ORDER — CLOPIDOGREL BISULFATE 75 MG PO TABS
75.0000 mg | ORAL_TABLET | Freq: Every day | ORAL | Status: DC
  Administered 2021-05-06 – 2021-05-08 (×3): 75 mg via ORAL
  Filled 2021-05-06 (×3): qty 1

## 2021-05-06 MED ORDER — TAMSULOSIN HCL 0.4 MG PO CAPS
0.4000 mg | ORAL_CAPSULE | Freq: Every day | ORAL | Status: DC
  Administered 2021-05-06 – 2021-05-07 (×2): 0.4 mg via ORAL
  Filled 2021-05-06 (×4): qty 1

## 2021-05-06 MED ORDER — POLYETHYLENE GLYCOL 3350 17 GM/SCOOP PO POWD
17.0000 g | Freq: Every day | ORAL | Status: DC | PRN
  Filled 2021-05-06: qty 255

## 2021-05-06 MED ORDER — ATORVASTATIN CALCIUM 40 MG PO TABS
40.0000 mg | ORAL_TABLET | Freq: Every day | ORAL | Status: DC
  Administered 2021-05-06 – 2021-05-08 (×3): 40 mg via ORAL
  Filled 2021-05-06 (×3): qty 1

## 2021-05-06 MED ORDER — MECLIZINE HCL 12.5 MG PO TABS
12.5000 mg | ORAL_TABLET | Freq: Three times a day (TID) | ORAL | Status: DC | PRN
  Filled 2021-05-06: qty 1
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
## 2021-05-06 NOTE — ED Triage Notes (Signed)
Pt BIB EMS from home for failure to thrive. He has had increased weakness, lethargy, and not eating or drinking at home. Pt was d/c from here 3 days. Family is requesting social work consult for home care/SNF. Pt is also having difficulties urinating  ?

## 2021-05-06 NOTE — Progress Notes (Signed)
?  05/06/21 1229  ?TOC ED Mini Assessment  ?TOC Time spent with patient (minutes): 15  ?PING Used in TOC Assessment No  ?Admission or Readmission Diverted Yes  ?Interventions which prevented an admission or readmission SNF Placement  ?What brought you to the Emergency Department?  decompensation at home  ?CMS Medicare.gov Compare Post Acute Care list provided to: Patient  ? ?CSW received consult regarding family expressing unable to care for patient at home. CSW noted PT consult recommended short term SNF. CSW met with patient, spouse, and daughter and discussed recommendation. Family and patient were agreeable to short term SNF and expressed a preference for [facility]. CSW discussed the referral process and noted consent to refer to options in the area. CSW will initiate referrals and follow-up with patient and family when offers are available.  ?

## 2021-05-06 NOTE — ED Notes (Signed)
Patient is resting comfortably. Respirations even and unlabored. NAD noted at this time ?

## 2021-05-06 NOTE — [place] FL2 (Signed)
?[place] MEDICAID FL2 LEVEL OF CARE SCREENING TOOL  ?  ? ?IDENTIFICATION  ?Patient Name: ?Evan Morales Birthdate: [birth year] Sex: male Admission Date (Current Location): ?05/06/2021  ?[place] and [place] Number: ? [place] ?  Facility and Address:  ?[facility], [place] [address], [place], [place] [address] ?     Provider Number: ?[id number]  ?Attending Physician Name and Address:  ?Gareth Morgan, MD ? Relative Name and Phone Number:  ?Payden Docter, spouse, [phone] ?   ?Current Level of Care: ?Other (Comment) (emergency department) Recommended Level of Care: ?[place] Prior Approval Number: ?  ? ?Date Approved/Denied: ?05/06/21 PASRR Number: ?[id number] ? ?Discharge Plan: ?Home ?  ? ?Current Diagnoses: ?Patient Active Problem List  ? Diagnosis Date Noted  ? 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 Chronic pain of both shoulders 01/30/2016  ? Vitamin B12 deficiency 07/27/2015  ? Overweight with body mass index (BMI) 25.0-29.9 07/27/2015  ? 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 Advanced care planning/counseling discussion 07/26/2014  ? Health maintenance examination 07/26/2014  ? Dysthymia 07/26/2014  ? 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 Medicare annual wellness visit, subsequent 07/13/2012  ? Constipation 04/09/2012  ? Vision loss, bilateral 04/06/2012  ? Bilateral carotid artery stenosis 04/06/2012  ? History of cerebrovascular accident (CVA) with residual deficit 03/27/2012  ? Prediabetes 07/05/2010  ? HYPERCHOLESTEROLEMIA 05/04/2009  ? ECZEMA 05/05/2007  ? Essential hypertension 05/13/2006  ? COLONIC POLYPS, ADENOMATOUS 05/15/2005  ? ? ?Orientation RESPIRATION BLADDER Height & Weight   ?  ?Self, Time, Situation, Place ? Normal Continent Weight: 200 lb (90.7 kg) ?Height:  '5\' 11"'$  (180.3 cm)  ?BEHAVIORAL  SYMPTOMS/MOOD NEUROLOGICAL BOWEL NUTRITION STATUS  ?    Continent Diet (see AVS)  ?AMBULATORY STATUS COMMUNICATION OF NEEDS Skin   ?Extensive Assist Verbally Normal ?  ?  ?  ?    ?     ?     ? ? ?Personal Care Assistance Level of Assistance  ?Bathing, Feeding, Dressing Bathing Assistance: Maximum assistance ?Feeding assistance: Limited assistance ?Dressing Assistance: Limited assistance ?   ? ?Functional Limitations Info  ?    ?  ?   ? ? ?SPECIAL CARE FACTORS FREQUENCY  ?PT (By licensed PT), OT (By licensed OT)   ?  ?PT Frequency: 5x weekly ?OT Frequency: 5x weekly ?  ?  ?  ?   ? ? ?Contractures Contractures Info: Not present  ? ? ?Additional Factors Info  ?Allergies, Code Status Code Status Info: full ?Allergies Info: nkda ?  ?  ?  ?   ? ?Current Medications (05/06/2021):  This is the current hospital active medication list ?Current Facility-Administered Medications  ?Medication Dose Route Frequency Provider Last Rate Last Admin  ? atorvastatin (LIPITOR) tablet 40 mg  40 mg Oral Daily Gareth Morgan, MD      ? brimonidine (ALPHAGAN) 0.2 % ophthalmic solution 1 drop  1 drop Right Eye BID Gareth Morgan, MD      ? clopidogrel (PLAVIX) tablet 75 mg  75 mg Oral Daily Gareth Morgan, MD      ? diclofenac Sodium (VOLTAREN) 1 % topical gel 4 g  4 g Topical QID Gareth Morgan, MD      ? dorzolamide-timolol (COSOPT) 22.3-6.8 MG/ML ophthalmic solution 1 drop  1 drop Right Eye BID Gareth Morgan, MD      ?  lisinopril (ZESTRIL) tablet 20 mg  20 mg Oral Daily Gareth Morgan, MD   20 mg at 05/06/21 1024  ? meclizine (ANTIVERT) tablet 12.5 mg  12.5 mg Oral TID PRN Gareth Morgan, MD      ? polyethylene glycol powder (GLYCOLAX/MIRALAX) container 17 g  17 g Oral Daily PRN Gareth Morgan, MD      ? psyllium (HYDROCIL/METAMUCIL) 1 packet  1 packet Oral Daily Gareth Morgan, MD      ? Derrill Memo ON 05/07/2021] quinapril (ACCUPRIL) tablet 20 mg  20 mg Oral Daily Gareth Morgan, MD      ? tamsulosin (FLOMAX) capsule 0.4 mg   0.4 mg Oral Daily Gareth Morgan, MD      ? vitamin B-12 (CYANOCOBALAMIN) tablet 1,000 mcg  1,000 mcg Oral Daily Gareth Morgan, MD      ? ?Current Outpatient Medications  ?Medication Sig Dispense Refill  ? atorvastatin (LIPITOR) 40 MG tablet TAKE 1 TABLET BY MOUTH EVERY DAY IN THE EVENING (Patient taking differently: Take 40 mg by mouth daily.) 90 tablet 3  ? brimonidine (ALPHAGAN) 0.2 % ophthalmic solution Place 1 drop into the right eye 2 (two) times daily.    ? celecoxib (CELEBREX) 200 MG capsule Take 1 capsule (200 mg total) by mouth 2 (two) times daily. 60 capsule 2  ? clopidogrel (PLAVIX) 75 MG tablet TAKE 1 TABLET BY MOUTH EVERY DAY (Patient taking differently: Take 75 mg by mouth daily.) 90 tablet 3  ? diclofenac Sodium (VOLTAREN) 1 % GEL Apply 4 g topically 4 (four) times daily. 350 g 2  ? dorzolamide-timolol (COSOPT) 22.3-6.8 MG/ML ophthalmic solution Place 1 drop into the right eye 2 (two) times daily.    ? meclizine (ANTIVERT) 12.5 MG tablet Take 1 tablet (12.5 mg total) by mouth 3 (three) times daily as needed for dizziness. 30 tablet 0  ? polyethylene glycol powder (GAVILAX) 17 GM/SCOOP powder TAKE 1/2 TO A WHOLE CAPFUL MIXED IN 8 OZ FLUID AS NEEDED FOR CONSTIPATION, HOLD FOR DIARRHEA. (Patient taking differently: Take 17 g by mouth daily as needed for mild constipation.) 255 g 3  ? psyllium (METAMUCIL SMOOTH TEXTURE) 58.6 % powder Take 1 packet by mouth daily.    ? quinapril (ACCUPRIL) 20 MG tablet TAKE 1 TABLET BY MOUTH EVERY DAY (Patient taking differently: Take 20 mg by mouth daily.) 90 tablet 3  ? vitamin B-12 (CYANOCOBALAMIN) 1000 MCG tablet Take 1,000 mcg by mouth daily.    ? ? ? ?Discharge Medications: ?Please see discharge summary for a list of discharge medications. ? ?Relevant Imaging Results: ? ?Relevant Lab Results: ? ? ?Additional Information ?ssn: [phone] ? ?Alfredia Ferguson, LCSW ? ? ? ? ?

## 2021-05-06 NOTE — Evaluation (Signed)
Physical Therapy Evaluation ?Patient Details ?Name: Evan Morales ?MRN: [id number] ?DOB: 08/20/1945 ?Today's Date: 05/06/2021 ? ?History of Present Illness ? Pt is a 76 y.o. M who presents 05/06/2021 with concern for generalized weakness, fall, and urinary retention. CTH negative for acute abnormality. PMH: CVA, legally blind in R eye, L eye with vision loss secondary to cataract, HTN, HLD.  ?Clinical Impression ? Pt admitted with above. Prior to 1 week ago, pt was ambulatory with a RW and modI with ADL's at a slow pace. Pt family reports significant decline in past week, with pt requiring increased assist and fall. Pt currently presents with decreased functional mobility secondary to generalized weakness, poor sitting/standing balance, cognitive impairments, decreased activity tolerance, and deconditioning/debility. Pt requiring maximal assist for bed mobility and moderate assist to stand x 3 from stretcher in ED. Pt unable to weight shift to take steps. BP sitting pre mobility 140/86, post mobility 166/86, SpO2 99% on RA, HR 69. Pt presents as a high fall risk based on history of falls and deficits listed above. Recommend short term SNF to address deficits, maximize functional mobility and decrease caregiver burden. ?   ? ?Recommendations for follow up therapy are one component of a multi-disciplinary discharge planning process, led by the attending physician.  Recommendations may be updated based on patient status, additional functional criteria and insurance authorization. ? ?Follow Up Recommendations Skilled nursing-short term rehab (<3 hours/day) ? ?  ?Assistance Recommended at Discharge Frequent or constant Supervision/Assistance  ?Patient can return home with the following ? Two people to help with walking and/or transfers;A lot of help with bathing/dressing/bathroom;Assistance with cooking/housework;Assist for transportation;Help with stairs or ramp for entrance ? ?  ?Equipment Recommendations  BSC/3in1;Wheelchair (measurements PT);Wheelchair cushion (measurements PT);Hospital bed  ?Recommendations for Other Services ?    ?  ?Functional Status Assessment Patient has had a recent decline in their functional status and demonstrates the ability to make significant improvements in function in a reasonable and predictable amount of time.  ? ?  ?Precautions / Restrictions Precautions ?Precautions: Fall ?Restrictions ?Weight Bearing Restrictions: No  ? ?  ? ?Mobility ? Bed Mobility ?Overal bed mobility: Needs Assistance ?Bed Mobility: Supine to Sit, Sit to Supine ?  ?  ?Supine to sit: Max assist ?Sit to supine: Mod assist ?  ?General bed mobility comments: Pt requiring cues for initiation, assist for BLE's off stretcher and heavy trunk assist to upright. Assist for BLE elevation back onto stretcher and for repositioning to midline ?  ? ?Transfers ?Overall transfer level: Needs assistance ?Equipment used: None ?Transfers: Sit to/from Stand ?Sit to Stand: Mod assist ?  ?  ?  ?  ?  ?General transfer comment: Heavy modA to stand x 3 from edge of stretcher, pt with increased bilateral knee flexion, unable to achieve upright posture. Increased trunk/hip flexion as well. Unable to weight shift to take side steps edge of stretcher ?  ? ?Ambulation/Gait ?  ?  ?  ?  ?  ?  ?  ?General Gait Details: unable ? ?Stairs ?  ?  ?  ?  ?  ? ?Wheelchair Mobility ?  ? ?Modified Rankin (Stroke Patients Only) ?  ? ?  ? ?Balance Overall balance assessment: Needs assistance ?Sitting-balance support: Feet supported ?Sitting balance-Leahy Scale: Fair ?  ?  ?Standing balance support: Bilateral upper extremity supported ?Standing balance-Leahy Scale: Poor ?Standing balance comment: reliant on external support of PT ?  ?  ?  ?  ?  ?  ?  ?  ?  ?  ?  ?   ? ? ? ?  Pertinent Vitals/Pain Pain Assessment ?Pain Assessment: No/denies pain  ? ? ?Home Living Family/patient expects to be discharged to:: Private residence ?Living Arrangements:  Spouse/significant other ?Available Help at Discharge: Family ?Type of Home: House ?Home Access: Stairs to enter ?Entrance Stairs-Rails: None ?Entrance Stairs-Number of Steps: 3 ?  ?Home Layout: One level ?Home Equipment: Conservation officer, nature (2 wheels);Cane - single point ?Additional Comments: Doorways in house are not w/c accessible  ?  ?Prior Function Prior Level of Function : Needs assist ?  ?  ?  ?  ?  ?  ?Mobility Comments: Uses RW, slow pace, requiring increased assist in past weeks. 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  ? ? ?Hand Dominance  ? Dominant Hand: Right ? ?  ?Extremity/Trunk Assessment  ? Upper Extremity Assessment ?Upper Extremity Assessment: RUE deficits/detail;LUE deficits/detail ?RUE Deficits / Details: R shoulder AROM to ~80 degrees ?LUE Deficits / Details: Full shoulder AROM ?  ? ?Lower Extremity Assessment ?Lower Extremity Assessment: RLE deficits/detail;LLE deficits/detail ?RLE Deficits / Details: Grossly 2/5 ?LLE Deficits / Details: Grossly 2/5 ?  ? ?   ?Communication  ? Communication: No difficulties  ?Cognition Arousal/Alertness: Awake/alert ?Behavior During Therapy: Flat affect ?Overall Cognitive Status: Impaired/Different from baseline ?Area of Impairment: Orientation, Memory, Following commands, Problem solving ?  ?  ?  ?  ?  ?  ?  ?  ?Orientation Level: Disoriented to, Time ?  ?Memory: Decreased short-term memory ?Following Commands: Follows one step commands with increased time ?  ?  ?Problem Solving: Slow processing, Decreased initiation, Requires verbal cues ?General Comments: Pt not oriented to time, stating year was 2022. Endorses difficulty "remembering things." ?  ?  ? ?  ?General Comments   ? ?  ?Exercises    ? ?Assessment/Plan  ?  ?PT Assessment Patient needs continued PT services  ?PT Problem List Decreased strength;Decreased activity  tolerance;Decreased balance;Decreased mobility;Decreased cognition;Decreased safety awareness ? ?   ?  ?PT Treatment Interventions DME instruction;Gait training;Functional mobility training;Therapeutic exercise;Therapeutic activities;Balance training;Patient/family education   ? ?PT Goals (Current goals can be found in the Care Plan section)  ?Acute Rehab PT Goals ?Patient Stated Goal: pt family agreeable for rehab ?PT Goal Formulation: With patient/family ?Time For Goal Achievement: 05/20/21 ?Potential to Achieve Goals: Good ? ?  ?Frequency Min 3X/week ?  ? ? ?Co-evaluation   ?  ?  ?  ?  ? ? ?  ?AM-PAC PT "6 Clicks" Mobility  ?Outcome Measure Help needed turning from your back to your side while in a flat bed without using bedrails?: A Lot ?Help needed moving from lying on your back to sitting on the side of a flat bed without using bedrails?: A Lot ?Help needed moving to and from a bed to a chair (including a wheelchair)?: A Lot ?Help needed standing up from a chair using your arms (e.g., wheelchair or bedside chair)?: A Lot ?Help needed to walk in hospital room?: Total ?Help needed climbing 3-5 steps with a railing? : Total ?6 Click Score: 10 ? ?  ?End of Session Equipment Utilized During Treatment: Gait belt ?Activity Tolerance: Patient tolerated treatment well ?Patient left: in bed;with call bell/phone within reach;with family/visitor present ?Nurse Communication: Mobility status ?PT Visit Diagnosis: Unsteadiness on feet (R26.81);Muscle weakness (generalized) (M62.81);Difficulty in walking, not elsewhere classified (R26.2) ?  ? ?Time: 4270-6237 ?PT Time Calculation (min) (ACUTE  ONLY): 30 min ? ? ?Charges:   PT Evaluation ?$PT Eval Moderate Complexity: 1 Mod ?PT Treatments ?$Therapeutic Activity: 8-22 mins ?  ?   ? ? ?Wyona Almas, PT, DPT ?Acute Rehabilitation Services ?Pager [phone] ?Office [phone] ? ? ?Deno Etienne ?05/06/2021, 11:48 AM ? ?

## 2021-05-07 NOTE — ED Notes (Signed)
Pt resting in bed with eyes closed, rise and fall noted of pts chest. No complaints at this time. Plan of care continues. ?

## 2021-05-07 NOTE — Progress Notes (Signed)
Physical Therapy Treatment ?Patient Details ?Name: Evan Morales ?MRN: [id number] ?DOB: Jul 05, 1945 ?Today's Date: 05/07/2021 ? ? ?History of Present Illness Pt is a 76 y.o. M who presents 05/06/2021 with concern for generalized weakness, fall, and urinary retention. CTH negative for acute abnormality. PMH: CVA, legally blind in R eye, L eye with vision loss secondary to cataract, HTN, HLD. ? ?  ?PT Comments  ? ? Pt progressing towards goals. Required mod to max A +2 for bed mobility and mod A +2 to stand and take steps this session. Required max multimodal cues and physical assist to move RLE when taking steps. Current recommendations for SNF appropriate. Will continue to follow acutely.  ?   ?Recommendations for follow up therapy are one component of a multi-disciplinary discharge planning process, led by the attending physician.  Recommendations may be updated based on patient status, additional functional criteria and insurance authorization. ? ?Follow Up Recommendations ? Skilled nursing-short term rehab (<3 hours/day) ?  ?  ?Assistance Recommended at Discharge Frequent or constant Supervision/Assistance  ?Patient can return home with the following Two people to help with walking and/or transfers;A lot of help with bathing/dressing/bathroom;Assistance with cooking/housework;Assist for transportation;Help with stairs or ramp for entrance ?  ?Equipment Recommendations ? BSC/3in1;Wheelchair (measurements PT);Wheelchair cushion (measurements PT);Hospital bed  ?  ?Recommendations for Other Services   ? ? ?  ?Precautions / Restrictions Precautions ?Precautions: Fall ?Restrictions ?Weight Bearing Restrictions: No  ?  ? ?Mobility ? Bed Mobility ?Overal bed mobility: Needs Assistance ?Bed Mobility: Supine to Sit, Sit to Supine ?  ?  ?Supine to sit: Max assist, +2 for safety/equipment, +2 for physical assistance ?Sit to supine: Mod assist, +2 for safety/equipment, +2 for physical assistance ?  ?General bed mobility  comments: Pt requiring cues for initiation, assist for BLE's off stretcher and heavy trunk assist to upright. Assist for BLE elevation back onto stretcher and for repositioning to midline ?  ? ?Transfers ?Overall transfer level: Needs assistance ?Equipment used: 1 person hand held assist ?Transfers: Sit to/from Stand ?Sit to Stand: Mod assist, +2 physical assistance ?  ?  ?  ?  ?  ?General transfer comment: Mod A +2 for lift assist and steadying to stand. Cues for upright posture. ?  ? ?Ambulation/Gait ?Ambulation/Gait assistance: Mod assist, +2 physical assistance ?Gait Distance (Feet): 2 Feet ?Assistive device: 2 person hand held assist ?  ?Gait velocity: Decreased ?  ?  ?General Gait Details: Took side steps at EOB and required max multimodal cues for sequencing. Required assist with moving RLE to the side. ? ? ?Stairs ?  ?  ?  ?  ?  ? ? ?Wheelchair Mobility ?  ? ?Modified Rankin (Stroke Patients Only) ?  ? ? ?  ?Balance Overall balance assessment: Needs assistance ?Sitting-balance support: Feet supported ?Sitting balance-Leahy Scale: Fair ?  ?  ?Standing balance support: Bilateral upper extremity supported ?Standing balance-Leahy Scale: Poor ?Standing balance comment: reliant on external support and BUE ?  ?  ?  ?  ?  ?  ?  ?  ?  ?  ?  ?  ? ?  ?Cognition Arousal/Alertness: Awake/alert ?Behavior During Therapy: Flat affect ?Overall Cognitive Status: Impaired/Different from baseline ?Area of Impairment: Memory, Following commands, Problem solving ?  ?  ?  ?  ?  ?  ?  ?  ?  ?  ?Memory: Decreased short-term memory ?Following Commands: Follows one step commands with increased time ?  ?  ?Problem Solving: Slow processing, Decreased initiation,  Requires verbal cues ?  ?  ?  ? ?  ?Exercises   ? ?  ?General Comments   ?  ?  ? ?Pertinent Vitals/Pain Pain Assessment ?Pain Assessment: Faces ?Faces Pain Scale: Hurts even more ?Pain Location: R knee ?Pain Descriptors / Indicators: Grimacing, Guarding ?Pain Intervention(s):  Limited activity within patient's tolerance, Monitored during session, Repositioned  ? ? ?Home Living Family/patient expects to be discharged to:: Private residence ?Living Arrangements: Spouse/significant other ?Available Help at Discharge: Family ?Type of Home: House ?Home Access: Stairs to enter ?Entrance Stairs-Rails: None ?Entrance Stairs-Number of Steps: 3 ?  ?Home Layout: One level ?Home Equipment: Conservation officer, nature (2 wheels);Cane - single point ?Additional Comments: Doorways in house are not w/c accessible  ?  ?Prior Function    ?  ?  ?   ? ?PT Goals (current goals can now be found in the care plan section) Acute Rehab PT Goals ?Patient Stated Goal: pt family agreeable for rehab ?PT Goal Formulation: With patient/family ?Time For Goal Achievement: 05/20/21 ?Potential to Achieve Goals: Good ?Progress towards PT goals: Progressing toward goals ? ?  ?Frequency ? ? ? Min 2X/week ? ? ? ?  ?PT Plan Current plan remains appropriate  ? ? ?Co-evaluation PT/OT/SLP Co-Evaluation/Treatment: Yes ?Reason for Co-Treatment: For patient/therapist safety;To address functional/ADL transfers ?PT goals addressed during session: Mobility/safety with mobility;Balance ?  ?  ? ?  ?AM-PAC PT "6 Clicks" Mobility   ?Outcome Measure ? Help needed turning from your back to your side while in a flat bed without using bedrails?: A Lot ?Help needed moving from lying on your back to sitting on the side of a flat bed without using bedrails?: A Lot ?Help needed moving to and from a bed to a chair (including a wheelchair)?: A Lot ?Help needed standing up from a chair using your arms (e.g., wheelchair or bedside chair)?: A Lot ?Help needed to walk in hospital room?: Total ?Help needed climbing 3-5 steps with a railing? : Total ?6 Click Score: 10 ? ?  ?End of Session Equipment Utilized During Treatment: Gait belt ?Activity Tolerance: Patient tolerated treatment well ?Patient left: in bed;with call bell/phone within reach;with family/visitor  present ?Nurse Communication: Mobility status ?PT Visit Diagnosis: Unsteadiness on feet (R26.81);Muscle weakness (generalized) (M62.81);Difficulty in walking, not elsewhere classified (R26.2) ?  ? ? ?Time: 1025-1040 ?PT Time Calculation (min) (ACUTE ONLY): 15 min ? ?Charges:  $Therapeutic Activity: 8-22 mins          ?          ? ?Reuel Derby, PT, DPT  ?Acute Rehabilitation Services  ?Pager: [phone] ?Office: [phone] ? ? ? ?[place] ?05/07/2021, 11:02 AM ? ?

## 2021-05-07 NOTE — Evaluation (Signed)
Occupational Therapy Evaluation ?Patient Details ?Name: Evan Grygiel ?MRN: [id number] ?DOB: 06/13/1945 ?Today's Date: 05/07/2021 ? ? ?History of Present Illness Pt is a 76 y.o. M who presents 05/06/2021 with concern for generalized weakness, fall, and urinary retention. CTH negative for acute abnormality. PMH: CVA, legally blind in R eye, L eye with vision loss secondary to cataract, HTN, HLD.  ? ?Clinical Impression ?  ?Patient admitted for above and presents with problem list below, including generalized weakness, impaired balance, decreased activity tolerance and impaired cognition.  He follow simple commands with increased time, presents with poor problem solving and decreased STM.  He currently requires up to mod assist +2 for transfers and limited mobility in room (side stepping at [facility]), mod to total assist for ADLs.  He will benefit from further OT services while admitted and after dc at SNF level to optimize independence prior to dc home. Will follow acutely.  ?   ? ?Recommendations for follow up therapy are one component of a multi-disciplinary discharge planning process, led by the attending physician.  Recommendations may be updated based on patient status, additional functional criteria and insurance authorization.  ? ?Follow Up Recommendations ? Skilled nursing-short term rehab (<3 hours/day)  ?  ?Assistance Recommended at Discharge Frequent or constant Supervision/Assistance  ?Patient can return home with the following A lot of help with walking and/or transfers;A lot of help with bathing/dressing/bathroom;Assistance with cooking/housework;Direct supervision/assist for medications management;Direct supervision/assist for financial management;Assist for transportation;Help with stairs or ramp for entrance ? ?  ?Functional Status Assessment ? Patient has had a recent decline in their functional status and demonstrates the ability to make significant improvements in function in a reasonable and  predictable amount of time.  ?Equipment Recommendations ? BSC/3in1 (defer)  ?  ?Recommendations for Other Services   ? ? ?  ?Precautions / Restrictions Precautions ?Precautions: Fall ?Restrictions ?Weight Bearing Restrictions: No  ? ?  ? ?Mobility Bed Mobility ?Overal bed mobility: Needs Assistance ?Bed Mobility: Supine to Sit, Sit to Supine ?  ?  ?Supine to sit: Max assist, +2 for safety/equipment, +2 for physical assistance ?Sit to supine: Mod assist, +2 for safety/equipment, +2 for physical assistance ?  ?General bed mobility comments: Pt requiring cues for initiation, assist for BLE's off stretcher and heavy trunk assist to upright. Assist for BLE elevation back onto stretcher and for repositioning to midline ?  ? ?Transfers ?  ?  ?  ?  ?  ?  ?  ?  ?  ?  ?  ? ?  ?Balance Overall balance assessment: Needs assistance ?Sitting-balance support: Feet supported ?Sitting balance-Leahy Scale: Fair ?  ?  ?Standing balance support: Bilateral upper extremity supported, During functional activity ?Standing balance-Leahy Scale: Poor ?Standing balance comment: reliant on external support and BUE ?  ?  ?  ?  ?  ?  ?  ?  ?  ?  ?  ?   ? ?ADL either performed or assessed with clinical judgement  ? ?ADL Overall ADL's : Needs assistance/impaired ?  ?  ?Grooming: Set up;Sitting ?  ?  ?  ?  ?  ?Upper Body Dressing : Moderate assistance;Sitting ?  ?Lower Body Dressing: +2 for physical assistance;+2 for safety/equipment;Sit to/from stand;Total assistance ?  ?Toilet Transfer: Moderate assistance;+2 for physical assistance;+2 for safety/equipment ?Toilet Transfer Details (indicate cue type and reason): simulated side stepping in room ?  ?  ?  ?  ?Functional mobility during ADLs: Moderate assistance;+2 for physical assistance;+2 for safety/equipment;Cueing for  sequencing;Cueing for safety ?   ? ? ? ?Vision Baseline Vision/History: 1 Wears glasses (legally blind R eye, cataracts L eye --poor vision baseline) ?Patient Visual Report: No  change from baseline ?Additional Comments: impaired at baseline  ?   ?Perception   ?  ?Praxis   ?  ? ?Pertinent Vitals/Pain Pain Assessment ?Pain Assessment: Faces ?Faces Pain Scale: Hurts even more ?Pain Location: R knee ?Pain Descriptors / Indicators: Grimacing, Guarding ?Pain Intervention(s): Limited activity within patient's tolerance, Monitored during session, Repositioned  ? ? ? ?Hand Dominance Right ?  ?Extremity/Trunk Assessment Upper Extremity Assessment ?Upper Extremity Assessment: RUE deficits/detail;Generalized weakness ?RUE Deficits / Details: R shoulder AROM to ~80 degrees ?  ?Lower Extremity Assessment ?Lower Extremity Assessment: Defer to PT evaluation ?  ?  ?  ?Communication Communication ?Communication: No difficulties ?  ?Cognition Arousal/Alertness: Awake/alert ?Behavior During Therapy: Flat affect ?Overall Cognitive Status: Impaired/Different from baseline ?Area of Impairment: Memory, Following commands, Problem solving ?  ?  ?  ?  ?  ?  ?  ?  ?  ?  ?Memory: Decreased short-term memory ?Following Commands: Follows one step commands with increased time ?  ?  ?Problem Solving: Slow processing, Decreased initiation, Requires verbal cues ?General Comments: pt requires increased time to follow simple commands, slow processing and poor sequencing ?  ?  ?General Comments  spouse present and supportive, pt fearful of falling ? ?  ?Exercises   ?  ?Shoulder Instructions    ? ? ?Home Living Family/patient expects to be discharged to:: Private residence ?Living Arrangements: Spouse/significant other ?Available Help at Discharge: Family ?Type of Home: House ?Home Access: Stairs to enter ?Entrance Stairs-Number of Steps: 3 ?Entrance Stairs-Rails: None ?Home Layout: One level ?  ?  ?Bathroom Shower/Tub: Walk-in shower ?  ?Bathroom Toilet: Standard ?  ?  ?Home Equipment: Conservation officer, nature (2 wheels);Cane - single point ?  ?Additional Comments: Doorways in house are not w/c accessible ?  ? ?  ?Prior  Functioning/Environment Prior Level of Function : Needs assist ?  ?  ?  ?  ?  ?  ?Mobility Comments: Uses RW, slow pace, requiring increased assist in past weeks. 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  ? ?  ?  ?OT Problem List: Decreased strength;Impaired balance (sitting and/or standing);Decreased activity tolerance;Decreased coordination;Decreased cognition;Decreased safety awareness;Decreased knowledge of use of DME or AE;Decreased knowledge of precautions;Pain ?  ?   ?OT Treatment/Interventions: Self-care/ADL training;Therapeutic exercise;DME and/or AE instruction;Therapeutic activities;Patient/family education;Balance training;Cognitive remediation/compensation  ?  ?OT Goals(Current goals can be found in the care plan section) Acute Rehab OT Goals ?Patient Stated Goal: get to rehab ?OT Goal Formulation: With patient ?Time For Goal Achievement: 05/21/21 ?Potential to Achieve Goals: Good  ?OT Frequency: Min 2X/week ?  ? ?Co-evaluation PT/OT/SLP Co-Evaluation/Treatment: Yes ?Reason for Co-Treatment: For patient/therapist safety;To address functional/ADL transfers ?PT goals addressed during session: Mobility/safety with mobility;Balance ?OT goals addressed during session: ADL's and self-care ?  ? ?  ?AM-PAC OT "6 Clicks" Daily Activity     ?Outcome Measure Help from another person eating meals?: A Little ?Help from another person taking care of personal grooming?: A Little ?Help from another person toileting, which includes using toliet, bedpan, or urinal?: Total ?Help from another person bathing (including washing, rinsing, drying)?: A Lot ?Help from another person to put on and taking off regular upper  body clothing?: A Lot ?Help from another person to put on and taking off regular lower body clothing?: Total ?6 Click Score: 12 ?  ?End of Session Equipment Utilized During  Treatment: Gait belt ?Nurse Communication: Mobility status;Other (comment) (visual deficits at baseline) ? ?Activity Tolerance: Patient tolerated treatment well ?Patient left: in bed;with call bell/phone within reach;with bed

## 2021-05-07 NOTE — Progress Notes (Signed)
Insurance authorization started with [employer]. Patients son Saralyn Pilar chose [facility]  ?

## 2021-05-07 NOTE — Progress Notes (Signed)
CSW received call from unit secretary stating that wife, Evan Morales was very upset and wanted to speak with CSW. ?CSW called wife @ [phone]. Wife was very agitated and states that she disagrees with Pt going to SNF at [facility]. CSW explained that insurance was already authorized for [facility] and any change might delay d/c. ? ?CSW met with Pt at bedside to ascertain Pt's wishes and Pt was unwilling to state whether he agreed with son's choice of [facility] or not. Pt states that he wishes to go to sleep.  ? ?CSW will update day shift CSW as this case may present complications due to Pt being slated to d/c at 10 am tomorrow morning.   ?

## 2021-05-07 NOTE — Discharge Planning (Signed)
RNCM  received call from son, Jorene Minors regarding decisions for his dad.  Saralyn Pilar reiterated the [facility] SIGNED ON 11-19-2016 MAY SPEAK WITH MARIE Rouch(SPOUSE) AND PATRICK Mikkelsen(SON) ONLY as noted in chart.  RNCM will communicate with staff. ?

## 2021-05-07 NOTE — ED Notes (Signed)
Breakfast order placed ?

## 2021-05-07 NOTE — ED Notes (Signed)
Social Work at bedside 

## 2021-05-07 NOTE — Progress Notes (Signed)
Pt has received insurance auth for SNF. Pt will be able to go tomorrow 4/11. ?CSW called Pt's son Saralyn Pilar to update. Son's plan is to transport Pt via POV. ? Josem Kaufmann for SNF # [phone] ?Auth for PTAR (if needed) [id number] ?

## 2021-05-08 DIAGNOSIS — J9811 Atelectasis: Secondary | ICD-10-CM | POA: Diagnosis not present

## 2021-05-08 DIAGNOSIS — Z7902 Long term (current) use of antithrombotics/antiplatelets: Secondary | ICD-10-CM | POA: Diagnosis not present

## 2021-05-08 DIAGNOSIS — H543 Unqualified visual loss, both eyes: Secondary | ICD-10-CM | POA: Diagnosis not present

## 2021-05-08 DIAGNOSIS — R410 Disorientation, unspecified: Secondary | ICD-10-CM | POA: Diagnosis not present

## 2021-05-08 DIAGNOSIS — R339 Retention of urine, unspecified: Secondary | ICD-10-CM | POA: Diagnosis not present

## 2021-05-08 DIAGNOSIS — K6389 Other specified diseases of intestine: Secondary | ICD-10-CM | POA: Diagnosis not present

## 2021-05-08 DIAGNOSIS — R630 Anorexia: Secondary | ICD-10-CM | POA: Diagnosis not present

## 2021-05-08 DIAGNOSIS — L89302 Pressure ulcer of unspecified buttock, stage 2: Secondary | ICD-10-CM | POA: Diagnosis not present

## 2021-05-08 DIAGNOSIS — H409 Unspecified glaucoma: Secondary | ICD-10-CM | POA: Diagnosis not present

## 2021-05-08 DIAGNOSIS — R278 Other lack of coordination: Secondary | ICD-10-CM | POA: Diagnosis not present

## 2021-05-08 DIAGNOSIS — R7303 Prediabetes: Secondary | ICD-10-CM | POA: Diagnosis not present

## 2021-05-08 DIAGNOSIS — H40012 Open angle with borderline findings, low risk, left eye: Secondary | ICD-10-CM | POA: Diagnosis not present

## 2021-05-08 DIAGNOSIS — R140 Abdominal distension (gaseous): Secondary | ICD-10-CM | POA: Diagnosis not present

## 2021-05-08 DIAGNOSIS — Z978 Presence of other specified devices: Secondary | ICD-10-CM | POA: Diagnosis not present

## 2021-05-08 DIAGNOSIS — F01518 Vascular dementia, unspecified severity, with other behavioral disturbance: Secondary | ICD-10-CM | POA: Diagnosis not present

## 2021-05-08 DIAGNOSIS — I6930 Unspecified sequelae of cerebral infarction: Secondary | ICD-10-CM | POA: Diagnosis not present

## 2021-05-08 DIAGNOSIS — I10 Essential (primary) hypertension: Secondary | ICD-10-CM | POA: Diagnosis not present

## 2021-05-08 DIAGNOSIS — R627 Adult failure to thrive: Secondary | ICD-10-CM | POA: Diagnosis not present

## 2021-05-08 DIAGNOSIS — N179 Acute kidney failure, unspecified: Secondary | ICD-10-CM | POA: Diagnosis not present

## 2021-05-08 DIAGNOSIS — B964 Proteus (mirabilis) (morganii) as the cause of diseases classified elsewhere: Secondary | ICD-10-CM | POA: Diagnosis not present

## 2021-05-08 DIAGNOSIS — R310 Gross hematuria: Secondary | ICD-10-CM | POA: Diagnosis not present

## 2021-05-08 DIAGNOSIS — E876 Hypokalemia: Secondary | ICD-10-CM | POA: Diagnosis not present

## 2021-05-08 DIAGNOSIS — Z743 Need for continuous supervision: Secondary | ICD-10-CM | POA: Diagnosis not present

## 2021-05-08 DIAGNOSIS — M6281 Muscle weakness (generalized): Secondary | ICD-10-CM | POA: Diagnosis not present

## 2021-05-08 DIAGNOSIS — Z7189 Other specified counseling: Secondary | ICD-10-CM | POA: Diagnosis not present

## 2021-05-08 DIAGNOSIS — H548 Legal blindness, as defined in USA: Secondary | ICD-10-CM | POA: Diagnosis not present

## 2021-05-08 DIAGNOSIS — Y846 Urinary catheterization as the cause of abnormal reaction of the patient, or of later complication, without mention of misadventure at the time of the procedure: Secondary | ICD-10-CM | POA: Diagnosis present

## 2021-05-08 DIAGNOSIS — M6259 Muscle wasting and atrophy, not elsewhere classified, multiple sites: Secondary | ICD-10-CM | POA: Diagnosis not present

## 2021-05-08 DIAGNOSIS — H5440 Blindness, one eye, unspecified eye: Secondary | ICD-10-CM | POA: Diagnosis not present

## 2021-05-08 DIAGNOSIS — M150 Primary generalized (osteo)arthritis: Secondary | ICD-10-CM | POA: Diagnosis not present

## 2021-05-08 DIAGNOSIS — T83511A Infection and inflammatory reaction due to indwelling urethral catheter, initial encounter: Secondary | ICD-10-CM | POA: Diagnosis not present

## 2021-05-08 DIAGNOSIS — E440 Moderate protein-calorie malnutrition: Secondary | ICD-10-CM | POA: Diagnosis not present

## 2021-05-08 DIAGNOSIS — E785 Hyperlipidemia, unspecified: Secondary | ICD-10-CM | POA: Diagnosis not present

## 2021-05-08 DIAGNOSIS — N400 Enlarged prostate without lower urinary tract symptoms: Secondary | ICD-10-CM | POA: Diagnosis not present

## 2021-05-08 DIAGNOSIS — N390 Urinary tract infection, site not specified: Secondary | ICD-10-CM | POA: Diagnosis not present

## 2021-05-08 DIAGNOSIS — Z66 Do not resuscitate: Secondary | ICD-10-CM | POA: Diagnosis not present

## 2021-05-08 DIAGNOSIS — R279 Unspecified lack of coordination: Secondary | ICD-10-CM | POA: Diagnosis not present

## 2021-05-08 DIAGNOSIS — M1990 Unspecified osteoarthritis, unspecified site: Secondary | ICD-10-CM | POA: Diagnosis not present

## 2021-05-08 DIAGNOSIS — G9341 Metabolic encephalopathy: Secondary | ICD-10-CM | POA: Diagnosis not present

## 2021-05-08 DIAGNOSIS — R42 Dizziness and giddiness: Secondary | ICD-10-CM | POA: Diagnosis not present

## 2021-05-08 DIAGNOSIS — E7800 Pure hypercholesterolemia, unspecified: Secondary | ICD-10-CM | POA: Diagnosis not present

## 2021-05-08 DIAGNOSIS — E860 Dehydration: Secondary | ICD-10-CM | POA: Diagnosis not present

## 2021-05-08 DIAGNOSIS — L89322 Pressure ulcer of left buttock, stage 2: Secondary | ICD-10-CM | POA: Diagnosis not present

## 2021-05-08 DIAGNOSIS — R55 Syncope and collapse: Secondary | ICD-10-CM | POA: Diagnosis not present

## 2021-05-08 DIAGNOSIS — R531 Weakness: Secondary | ICD-10-CM | POA: Diagnosis not present

## 2021-05-08 DIAGNOSIS — L89312 Pressure ulcer of right buttock, stage 2: Secondary | ICD-10-CM | POA: Diagnosis not present

## 2021-05-08 DIAGNOSIS — Z515 Encounter for palliative care: Secondary | ICD-10-CM | POA: Diagnosis not present

## 2021-05-08 DIAGNOSIS — R2689 Other abnormalities of gait and mobility: Secondary | ICD-10-CM | POA: Diagnosis not present

## 2021-05-08 DIAGNOSIS — R41841 Cognitive communication deficit: Secondary | ICD-10-CM | POA: Diagnosis not present

## 2021-05-08 DIAGNOSIS — K8020 Calculus of gallbladder without cholecystitis without obstruction: Secondary | ICD-10-CM | POA: Diagnosis not present

## 2021-05-08 DIAGNOSIS — I639 Cerebral infarction, unspecified: Secondary | ICD-10-CM | POA: Diagnosis not present

## 2021-05-08 DIAGNOSIS — R1312 Dysphagia, oropharyngeal phase: Secondary | ICD-10-CM | POA: Diagnosis not present

## 2021-05-08 DIAGNOSIS — W19XXXA Unspecified fall, initial encounter: Secondary | ICD-10-CM | POA: Diagnosis not present

## 2021-05-08 DIAGNOSIS — Z947 Corneal transplant status: Secondary | ICD-10-CM | POA: Diagnosis not present

## 2021-05-08 DIAGNOSIS — F0150 Vascular dementia without behavioral disturbance: Secondary | ICD-10-CM | POA: Diagnosis not present

## 2021-05-08 DIAGNOSIS — R001 Bradycardia, unspecified: Secondary | ICD-10-CM | POA: Diagnosis not present

## 2021-05-08 DIAGNOSIS — I700 Atherosclerosis of aorta: Secondary | ICD-10-CM | POA: Diagnosis not present

## 2021-05-08 DIAGNOSIS — Z8673 Personal history of transient ischemic attack (TIA), and cerebral infarction without residual deficits: Secondary | ICD-10-CM | POA: Diagnosis not present

## 2021-05-08 DIAGNOSIS — I959 Hypotension, unspecified: Secondary | ICD-10-CM | POA: Diagnosis not present

## 2021-05-08 DIAGNOSIS — H53132 Sudden visual loss, left eye: Secondary | ICD-10-CM | POA: Diagnosis not present

## 2021-05-08 DIAGNOSIS — K5900 Constipation, unspecified: Secondary | ICD-10-CM | POA: Diagnosis not present

## 2021-05-08 DIAGNOSIS — R4182 Altered mental status, unspecified: Secondary | ICD-10-CM | POA: Diagnosis not present

## 2021-05-08 DIAGNOSIS — R338 Other retention of urine: Secondary | ICD-10-CM | POA: Diagnosis not present

## 2021-05-08 MED ORDER — TAMSULOSIN HCL 0.4 MG PO CAPS: 0.4000 mg | ORAL_CAPSULE | Freq: Every day | ORAL | 0 refills | Status: DC

## 2021-05-08 MED ORDER — TAMSULOSIN HCL 0.4 MG PO CAPS: 0.4000 mg | ORAL_CAPSULE | Freq: Every day | ORAL | 0 refills | Status: AC

## 2021-05-08 NOTE — ED Notes (Signed)
This writer attempted to call report to SNF but had to leave call back # . ?

## 2021-05-08 NOTE — ED Provider Notes (Signed)
Emergency Medicine Observation Re-evaluation Note ? ?Evan Morales is a 76 y.o. male, seen on rounds today.  Pt initially presented to the ED for complaints of Fatigue ?Currently, the patient is waiting for placement. ? ?Physical Exam  ?BP (!) 171/91 (BP Location: Left Arm)   Pulse 80   Temp 97.8 ?F (36.6 ?C)   Resp 18   Ht '5\' 11"'$  (1.803 m)   Wt 90.7 kg   SpO2 100%   BMI 27.89 kg/m?  ?Physical Exam ?General: Asleep, easily arousable ?Lungs: Normal effort ?Abdomen: Soft, nontender ?Psych: No psychosis ? ?ED Course / MDM  ?EKG:EKG Interpretation ? ?Date/Time:  Sunday May 06 2021 10:03:35 EDT ?Ventricular Rate:  72 ?PR Interval:  200 ?QRS Duration: 104 ?QT Interval:  406 ?QTC Calculation: 445 ?R Axis:   -4 ?Text Interpretation: Sinus rhythm Left axis deviation No significant change since last tracing Confirmed by Octaviano Glow [phone]) on 05/07/2021 12:28:35 PM ? ?I have reviewed the labs performed to date as well as medications administered while in observation.  Recent changes in the last 24 hours include home meds and Flomax. ? ?Plan  ?Current plan is for discharge to [facility]. I have prescribed the Flomax and he will need to follow-up with urology.. ? Evan Morales is not under involuntary commitment. ? ? ?  ?Sherwood Gambler, MD ?05/08/21 [phone] ? ?

## 2021-05-08 NOTE — ED Notes (Signed)
Missing meds ordered from Pharmacy ?

## 2021-05-08 NOTE — ED Notes (Signed)
Pt skin to buttocks red ,barrier cream applied ?

## 2021-05-08 NOTE — ED Notes (Signed)
Per nursing notes , Pt had foley inserted for urinary retention. Pt takes flomax.  SW was not aware Pt had foley . ?

## 2021-05-08 NOTE — ED Notes (Signed)
Breakfast order placed ?

## 2021-05-08 NOTE — ED Notes (Signed)
EDP [place] in Pt room x2 to talk to wife. Wife just walked into hall to ask when the DR was coming back.  ?

## 2021-05-08 NOTE — ED Notes (Signed)
TC to [facility] SN[facility] called report for PT. Currently waiting for son to pick Pt up.Son will drive Pt to SNF. AVS in pt rrom. ?

## 2021-05-08 NOTE — Discharge Instructions (Addendum)
Leave the Foley catheter in until you see the urologist either this week or next week.  Take tamsulosin/Flomax to help with your prostate. ?

## 2021-05-09 DIAGNOSIS — R531 Weakness: Secondary | ICD-10-CM | POA: Diagnosis not present

## 2021-05-09 DIAGNOSIS — I639 Cerebral infarction, unspecified: Secondary | ICD-10-CM | POA: Diagnosis not present

## 2021-05-09 DIAGNOSIS — W19XXXA Unspecified fall, initial encounter: Secondary | ICD-10-CM | POA: Diagnosis not present

## 2021-05-09 DIAGNOSIS — R339 Retention of urine, unspecified: Secondary | ICD-10-CM | POA: Diagnosis not present

## 2021-05-10 ENCOUNTER — Other Ambulatory Visit: Payer: Self-pay

## 2021-05-10 ENCOUNTER — Emergency Department (HOSPITAL_COMMUNITY): Payer: PPO

## 2021-05-10 ENCOUNTER — Encounter (HOSPITAL_COMMUNITY): Payer: Self-pay

## 2021-05-10 ENCOUNTER — Inpatient Hospital Stay (HOSPITAL_COMMUNITY)
Admission: EM | Admit: 2021-05-10 | Discharge: 2021-05-23 | DRG: 698 | Disposition: A | Discharge status: Skilled Nursing Facility | Payer: PPO | Attending: Student | Admitting: Student

## 2021-05-10 ENCOUNTER — Observation Stay (HOSPITAL_COMMUNITY): Payer: PPO

## 2021-05-10 DIAGNOSIS — N390 Urinary tract infection, site not specified: Secondary | ICD-10-CM | POA: Diagnosis present

## 2021-05-10 DIAGNOSIS — I10 Essential (primary) hypertension: Secondary | ICD-10-CM | POA: Diagnosis present

## 2021-05-10 DIAGNOSIS — R531 Weakness: Secondary | ICD-10-CM

## 2021-05-10 DIAGNOSIS — N179 Acute kidney failure, unspecified: Secondary | ICD-10-CM | POA: Diagnosis present

## 2021-05-10 DIAGNOSIS — R55 Syncope and collapse: Secondary | ICD-10-CM | POA: Diagnosis not present

## 2021-05-10 DIAGNOSIS — Z83511 Family history of glaucoma: Secondary | ICD-10-CM

## 2021-05-10 DIAGNOSIS — E860 Dehydration: Secondary | ICD-10-CM | POA: Diagnosis present

## 2021-05-10 DIAGNOSIS — H40012 Open angle with borderline findings, low risk, left eye: Secondary | ICD-10-CM | POA: Diagnosis present

## 2021-05-10 DIAGNOSIS — H409 Unspecified glaucoma: Secondary | ICD-10-CM | POA: Diagnosis present

## 2021-05-10 DIAGNOSIS — Z820 Family history of epilepsy and other diseases of the nervous system: Secondary | ICD-10-CM

## 2021-05-10 DIAGNOSIS — F0150 Vascular dementia without behavioral disturbance: Secondary | ICD-10-CM

## 2021-05-10 DIAGNOSIS — Y846 Urinary catheterization as the cause of abnormal reaction of the patient, or of later complication, without mention of misadventure at the time of the procedure: Secondary | ICD-10-CM | POA: Diagnosis present

## 2021-05-10 DIAGNOSIS — R7303 Prediabetes: Secondary | ICD-10-CM | POA: Diagnosis present

## 2021-05-10 DIAGNOSIS — R339 Retention of urine, unspecified: Secondary | ICD-10-CM | POA: Diagnosis present

## 2021-05-10 DIAGNOSIS — Z66 Do not resuscitate: Secondary | ICD-10-CM | POA: Diagnosis present

## 2021-05-10 DIAGNOSIS — Z7902 Long term (current) use of antithrombotics/antiplatelets: Secondary | ICD-10-CM

## 2021-05-10 DIAGNOSIS — Z96651 Presence of right artificial knee joint: Secondary | ICD-10-CM | POA: Diagnosis present

## 2021-05-10 DIAGNOSIS — R4182 Altered mental status, unspecified: Secondary | ICD-10-CM | POA: Diagnosis not present

## 2021-05-10 DIAGNOSIS — B964 Proteus (mirabilis) (morganii) as the cause of diseases classified elsewhere: Secondary | ICD-10-CM | POA: Diagnosis present

## 2021-05-10 DIAGNOSIS — M6281 Muscle weakness (generalized): Secondary | ICD-10-CM | POA: Diagnosis not present

## 2021-05-10 DIAGNOSIS — K5900 Constipation, unspecified: Secondary | ICD-10-CM | POA: Diagnosis not present

## 2021-05-10 DIAGNOSIS — Z79899 Other long term (current) drug therapy: Secondary | ICD-10-CM

## 2021-05-10 DIAGNOSIS — E785 Hyperlipidemia, unspecified: Secondary | ICD-10-CM | POA: Diagnosis not present

## 2021-05-10 DIAGNOSIS — E876 Hypokalemia: Secondary | ICD-10-CM | POA: Diagnosis present

## 2021-05-10 DIAGNOSIS — G9341 Metabolic encephalopathy: Secondary | ICD-10-CM | POA: Diagnosis present

## 2021-05-10 DIAGNOSIS — R338 Other retention of urine: Secondary | ICD-10-CM

## 2021-05-10 DIAGNOSIS — Z947 Corneal transplant status: Secondary | ICD-10-CM

## 2021-05-10 DIAGNOSIS — R42 Dizziness and giddiness: Secondary | ICD-10-CM | POA: Diagnosis not present

## 2021-05-10 DIAGNOSIS — I959 Hypotension, unspecified: Secondary | ICD-10-CM | POA: Diagnosis present

## 2021-05-10 DIAGNOSIS — Z8673 Personal history of transient ischemic attack (TIA), and cerebral infarction without residual deficits: Secondary | ICD-10-CM

## 2021-05-10 DIAGNOSIS — F01518 Vascular dementia, unspecified severity, with other behavioral disturbance: Secondary | ICD-10-CM | POA: Diagnosis present

## 2021-05-10 DIAGNOSIS — Z87891 Personal history of nicotine dependence: Secondary | ICD-10-CM

## 2021-05-10 DIAGNOSIS — H543 Unqualified visual loss, both eyes: Secondary | ICD-10-CM | POA: Diagnosis not present

## 2021-05-10 DIAGNOSIS — R310 Gross hematuria: Secondary | ICD-10-CM

## 2021-05-10 DIAGNOSIS — L89312 Pressure ulcer of right buttock, stage 2: Secondary | ICD-10-CM | POA: Diagnosis present

## 2021-05-10 DIAGNOSIS — R627 Adult failure to thrive: Secondary | ICD-10-CM | POA: Diagnosis present

## 2021-05-10 DIAGNOSIS — Z978 Presence of other specified devices: Secondary | ICD-10-CM

## 2021-05-10 DIAGNOSIS — H548 Legal blindness, as defined in USA: Secondary | ICD-10-CM | POA: Diagnosis present

## 2021-05-10 DIAGNOSIS — I639 Cerebral infarction, unspecified: Secondary | ICD-10-CM | POA: Diagnosis not present

## 2021-05-10 DIAGNOSIS — I6930 Unspecified sequelae of cerebral infarction: Secondary | ICD-10-CM

## 2021-05-10 DIAGNOSIS — E7800 Pure hypercholesterolemia, unspecified: Secondary | ICD-10-CM | POA: Diagnosis present

## 2021-05-10 DIAGNOSIS — L89322 Pressure ulcer of left buttock, stage 2: Secondary | ICD-10-CM | POA: Diagnosis present

## 2021-05-10 DIAGNOSIS — M150 Primary generalized (osteo)arthritis: Secondary | ICD-10-CM | POA: Diagnosis not present

## 2021-05-10 DIAGNOSIS — E440 Moderate protein-calorie malnutrition: Secondary | ICD-10-CM | POA: Diagnosis present

## 2021-05-10 DIAGNOSIS — L8990 Pressure ulcer of unspecified site, unspecified stage: Secondary | ICD-10-CM | POA: Insufficient documentation

## 2021-05-10 DIAGNOSIS — T83511A Infection and inflammatory reaction due to indwelling urethral catheter, initial encounter: Principal | ICD-10-CM | POA: Diagnosis present

## 2021-05-10 DIAGNOSIS — M1990 Unspecified osteoarthritis, unspecified site: Secondary | ICD-10-CM | POA: Diagnosis present

## 2021-05-10 LAB — URINALYSIS, ROUTINE W REFLEX MICROSCOPIC: RBC / HPF: >50 RBC/hpf — ABNORMAL HIGH (ref 0–5)

## 2021-05-10 LAB — CBC WITH DIFFERENTIAL/PLATELET
Abs Immature Granulocytes: 0.05 10*3/uL (ref 0.00–0.07)
Basophils Absolute: 0 10*3/uL (ref 0.0–0.1)
Basophils Relative: 0 %
Eosinophils Absolute: 0.1 10*3/uL (ref 0.0–0.5)
Eosinophils Relative: 1 %
HCT: 42.1 % (ref 39.0–52.0)
Hemoglobin: 14.6 g/dL (ref 13.0–17.0)
Immature Granulocytes: 0 %
Lymphocytes Relative: 4 %
Lymphs Abs: 0.6 10*3/uL — ABNORMAL LOW (ref 0.7–4.0)
MCH: 33.2 pg (ref 26.0–34.0)
MCHC: 34.7 g/dL (ref 30.0–36.0)
MCV: 95.7 fL (ref 80.0–100.0)
Monocytes Absolute: 1 10*3/uL (ref 0.1–1.0)
Monocytes Relative: 7 %
Neutro Abs: 12.2 10*3/uL — ABNORMAL HIGH (ref 1.7–7.7)
Neutrophils Relative %: 88 %
Platelets: 191 10*3/uL (ref 150–400)
RBC: 4.4 MIL/uL (ref 4.22–5.81)
RDW: 12.3 % (ref 11.5–15.5)
WBC: 13.9 10*3/uL — ABNORMAL HIGH (ref 4.0–10.5)
nRBC: 0 % (ref 0.0–0.2)

## 2021-05-10 LAB — COMPREHENSIVE METABOLIC PANEL
ALT: 28 U/L (ref 0–44)
AST: 55 U/L — ABNORMAL HIGH (ref 15–41)
Albumin: 3.6 g/dL (ref 3.5–5.0)
Alkaline Phosphatase: 66 U/L (ref 38–126)
Anion gap: 6 (ref 5–15)
BUN: 42 mg/dL — ABNORMAL HIGH (ref 8–23)
CO2: 26 mmol/L (ref 22–32)
Calcium: 8.8 mg/dL — ABNORMAL LOW (ref 8.9–10.3)
Chloride: 104 mmol/L (ref 98–111)
Creatinine, Ser: 1.76 mg/dL — ABNORMAL HIGH (ref 0.61–1.24)
GFR, Estimated: 40 mL/min — ABNORMAL LOW (ref >60)
Glucose, Bld: 138 mg/dL — ABNORMAL HIGH (ref 70–99)
Potassium: 3.2 mmol/L — ABNORMAL LOW (ref 3.5–5.1)
Sodium: 136 mmol/L (ref 135–145)
Total Bilirubin: 2.4 mg/dL — ABNORMAL HIGH (ref 0.3–1.2)
Total Protein: 6.8 g/dL (ref 6.5–8.1)

## 2021-05-10 LAB — MAGNESIUM: Magnesium: 1.9 mg/dL (ref 1.7–2.4)

## 2021-05-10 LAB — TROPONIN I (HIGH SENSITIVITY)
Troponin I (High Sensitivity): 16 ng/L (ref <18)
Troponin I (High Sensitivity): 13 ng/L (ref <18)

## 2021-05-10 MED ORDER — LACTATED RINGERS IV SOLN: INTRAVENOUS | Status: DC

## 2021-05-10 MED ORDER — LACTATED RINGERS IV BOLUS
500.0000 mL | Freq: Once | INTRAVENOUS | Status: AC
  Administered 2021-05-10: 500 mL via INTRAVENOUS

## 2021-05-10 MED ORDER — POTASSIUM CHLORIDE CRYS ER 20 MEQ PO TBCR
40.0000 meq | EXTENDED_RELEASE_TABLET | Freq: Every day | ORAL | Status: DC
  Administered 2021-05-10: 40 meq via ORAL
  Filled 2021-05-10: qty 2

## 2021-05-10 MED ORDER — SODIUM CHLORIDE 0.9% FLUSH
3.0000 mL | Freq: Two times a day (BID) | INTRAVENOUS | Status: DC
  Administered 2021-05-10 – 2021-05-22 (×15): 3 mL via INTRAVENOUS

## 2021-05-10 MED ORDER — MELATONIN 3 MG PO TABS
3.0000 mg | ORAL_TABLET | Freq: Once | ORAL | Status: AC
  Administered 2021-05-10: 3 mg via ORAL
  Filled 2021-05-10: qty 1

## 2021-05-10 NOTE — ED Notes (Signed)
Called to room by NT d/t bleeding noted on chux and blanket.  Blood noted to be oozing around catheter.  Area cleansed.  Will inform Dr. Marylyn Ishihara.  ?
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
## 2021-05-10 NOTE — ED Triage Notes (Signed)
Pt BIB EMS for syncope episode. Pt passed out for 30 sec in the bathroom. Family wanted him sent out to the ED. ? ?Initial BP 60/40 ?Recent BP 96/54 ? ?160 cbg ?168 hr ? ?18g right forearm ?
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
## 2021-05-11 ENCOUNTER — Observation Stay (HOSPITAL_COMMUNITY): Payer: PPO

## 2021-05-11 DIAGNOSIS — L89302 Pressure ulcer of unspecified buttock, stage 2: Secondary | ICD-10-CM | POA: Diagnosis not present

## 2021-05-11 DIAGNOSIS — R410 Disorientation, unspecified: Secondary | ICD-10-CM | POA: Diagnosis not present

## 2021-05-11 DIAGNOSIS — T83511A Infection and inflammatory reaction due to indwelling urethral catheter, initial encounter: Secondary | ICD-10-CM | POA: Diagnosis present

## 2021-05-11 DIAGNOSIS — Z7189 Other specified counseling: Secondary | ICD-10-CM | POA: Diagnosis not present

## 2021-05-11 DIAGNOSIS — N179 Acute kidney failure, unspecified: Secondary | ICD-10-CM | POA: Diagnosis present

## 2021-05-11 DIAGNOSIS — H40012 Open angle with borderline findings, low risk, left eye: Secondary | ICD-10-CM | POA: Diagnosis not present

## 2021-05-11 DIAGNOSIS — R55 Syncope and collapse: Secondary | ICD-10-CM

## 2021-05-11 DIAGNOSIS — K8020 Calculus of gallbladder without cholecystitis without obstruction: Secondary | ICD-10-CM | POA: Diagnosis not present

## 2021-05-11 DIAGNOSIS — M1990 Unspecified osteoarthritis, unspecified site: Secondary | ICD-10-CM | POA: Diagnosis present

## 2021-05-11 DIAGNOSIS — F0150 Vascular dementia without behavioral disturbance: Secondary | ICD-10-CM | POA: Diagnosis not present

## 2021-05-11 DIAGNOSIS — R279 Unspecified lack of coordination: Secondary | ICD-10-CM | POA: Diagnosis not present

## 2021-05-11 DIAGNOSIS — Z515 Encounter for palliative care: Secondary | ICD-10-CM | POA: Diagnosis not present

## 2021-05-11 DIAGNOSIS — L89312 Pressure ulcer of right buttock, stage 2: Secondary | ICD-10-CM | POA: Diagnosis present

## 2021-05-11 DIAGNOSIS — R627 Adult failure to thrive: Secondary | ICD-10-CM | POA: Diagnosis present

## 2021-05-11 DIAGNOSIS — I6930 Unspecified sequelae of cerebral infarction: Secondary | ICD-10-CM | POA: Diagnosis not present

## 2021-05-11 DIAGNOSIS — R338 Other retention of urine: Secondary | ICD-10-CM

## 2021-05-11 DIAGNOSIS — H548 Legal blindness, as defined in USA: Secondary | ICD-10-CM | POA: Diagnosis present

## 2021-05-11 DIAGNOSIS — H409 Unspecified glaucoma: Secondary | ICD-10-CM | POA: Diagnosis present

## 2021-05-11 DIAGNOSIS — E860 Dehydration: Secondary | ICD-10-CM | POA: Diagnosis present

## 2021-05-11 DIAGNOSIS — I700 Atherosclerosis of aorta: Secondary | ICD-10-CM | POA: Diagnosis not present

## 2021-05-11 DIAGNOSIS — K5900 Constipation, unspecified: Secondary | ICD-10-CM | POA: Diagnosis not present

## 2021-05-11 DIAGNOSIS — R531 Weakness: Secondary | ICD-10-CM | POA: Diagnosis not present

## 2021-05-11 DIAGNOSIS — B964 Proteus (mirabilis) (morganii) as the cause of diseases classified elsewhere: Secondary | ICD-10-CM | POA: Diagnosis present

## 2021-05-11 DIAGNOSIS — Z978 Presence of other specified devices: Secondary | ICD-10-CM

## 2021-05-11 DIAGNOSIS — R339 Retention of urine, unspecified: Secondary | ICD-10-CM | POA: Diagnosis present

## 2021-05-11 DIAGNOSIS — G9341 Metabolic encephalopathy: Secondary | ICD-10-CM | POA: Diagnosis present

## 2021-05-11 DIAGNOSIS — Z947 Corneal transplant status: Secondary | ICD-10-CM | POA: Diagnosis not present

## 2021-05-11 DIAGNOSIS — R310 Gross hematuria: Secondary | ICD-10-CM | POA: Diagnosis not present

## 2021-05-11 DIAGNOSIS — Z743 Need for continuous supervision: Secondary | ICD-10-CM | POA: Diagnosis not present

## 2021-05-11 DIAGNOSIS — R140 Abdominal distension (gaseous): Secondary | ICD-10-CM | POA: Diagnosis not present

## 2021-05-11 DIAGNOSIS — N400 Enlarged prostate without lower urinary tract symptoms: Secondary | ICD-10-CM | POA: Diagnosis not present

## 2021-05-11 DIAGNOSIS — I959 Hypotension, unspecified: Secondary | ICD-10-CM | POA: Diagnosis present

## 2021-05-11 DIAGNOSIS — Z66 Do not resuscitate: Secondary | ICD-10-CM | POA: Diagnosis present

## 2021-05-11 DIAGNOSIS — R7303 Prediabetes: Secondary | ICD-10-CM | POA: Diagnosis present

## 2021-05-11 DIAGNOSIS — Y846 Urinary catheterization as the cause of abnormal reaction of the patient, or of later complication, without mention of misadventure at the time of the procedure: Secondary | ICD-10-CM | POA: Diagnosis present

## 2021-05-11 DIAGNOSIS — Z8673 Personal history of transient ischemic attack (TIA), and cerebral infarction without residual deficits: Secondary | ICD-10-CM | POA: Diagnosis not present

## 2021-05-11 DIAGNOSIS — F01518 Vascular dementia, unspecified severity, with other behavioral disturbance: Secondary | ICD-10-CM | POA: Diagnosis present

## 2021-05-11 DIAGNOSIS — E440 Moderate protein-calorie malnutrition: Secondary | ICD-10-CM | POA: Diagnosis present

## 2021-05-11 DIAGNOSIS — I10 Essential (primary) hypertension: Secondary | ICD-10-CM | POA: Diagnosis present

## 2021-05-11 DIAGNOSIS — E876 Hypokalemia: Secondary | ICD-10-CM | POA: Diagnosis present

## 2021-05-11 DIAGNOSIS — L89322 Pressure ulcer of left buttock, stage 2: Secondary | ICD-10-CM | POA: Diagnosis present

## 2021-05-11 DIAGNOSIS — Z7902 Long term (current) use of antithrombotics/antiplatelets: Secondary | ICD-10-CM | POA: Diagnosis not present

## 2021-05-11 DIAGNOSIS — L8990 Pressure ulcer of unspecified site, unspecified stage: Secondary | ICD-10-CM | POA: Insufficient documentation

## 2021-05-11 DIAGNOSIS — E7800 Pure hypercholesterolemia, unspecified: Secondary | ICD-10-CM | POA: Diagnosis present

## 2021-05-11 DIAGNOSIS — R630 Anorexia: Secondary | ICD-10-CM | POA: Diagnosis not present

## 2021-05-11 DIAGNOSIS — K6389 Other specified diseases of intestine: Secondary | ICD-10-CM | POA: Diagnosis not present

## 2021-05-11 DIAGNOSIS — N390 Urinary tract infection, site not specified: Secondary | ICD-10-CM | POA: Diagnosis present

## 2021-05-11 LAB — CBC
HCT: 41.1 % (ref 39.0–52.0)
Hemoglobin: 14 g/dL (ref 13.0–17.0)
MCH: 32.6 pg (ref 26.0–34.0)
MCHC: 34.1 g/dL (ref 30.0–36.0)
MCV: 95.8 fL (ref 80.0–100.0)
Platelets: 165 10*3/uL (ref 150–400)
RBC: 4.29 MIL/uL (ref 4.22–5.81)
RDW: 12.2 % (ref 11.5–15.5)
WBC: 11.1 10*3/uL — ABNORMAL HIGH (ref 4.0–10.5)
nRBC: 0 % (ref 0.0–0.2)

## 2021-05-11 LAB — COMPREHENSIVE METABOLIC PANEL
ALT: 29 U/L (ref 0–44)
AST: 50 U/L — ABNORMAL HIGH (ref 15–41)
Albumin: 3.3 g/dL — ABNORMAL LOW (ref 3.5–5.0)
Alkaline Phosphatase: 61 U/L (ref 38–126)
Anion gap: 8 (ref 5–15)
BUN: 21 mg/dL (ref 8–23)
CO2: 22 mmol/L (ref 22–32)
Calcium: 8.6 mg/dL — ABNORMAL LOW (ref 8.9–10.3)
Chloride: 108 mmol/L (ref 98–111)
Creatinine, Ser: 0.61 mg/dL (ref 0.61–1.24)
GFR, Estimated: >60 mL/min (ref >60)
Glucose, Bld: 102 mg/dL — ABNORMAL HIGH (ref 70–99)
Potassium: 3.4 mmol/L — ABNORMAL LOW (ref 3.5–5.1)
Sodium: 138 mmol/L (ref 135–145)
Total Bilirubin: 2.6 mg/dL — ABNORMAL HIGH (ref 0.3–1.2)
Total Protein: 6.4 g/dL — ABNORMAL LOW (ref 6.5–8.1)

## 2021-05-11 LAB — ECHOCARDIOGRAM COMPLETE
Area-P 1/2: 3.16 cm2
Height: 71 in
S' Lateral: 2.7 cm
Weight: 3238.12 oz

## 2021-05-11 LAB — GLUCOSE, CAPILLARY
Glucose-Capillary: 103 mg/dL — ABNORMAL HIGH (ref 70–99)
Glucose-Capillary: 96 mg/dL (ref 70–99)
Glucose-Capillary: 112 mg/dL — ABNORMAL HIGH (ref 70–99)

## 2021-05-11 MED ORDER — HYDRALAZINE HCL 20 MG/ML IJ SOLN
10.0000 mg | INTRAMUSCULAR | Status: DC | PRN
  Administered 2021-05-11 – 2021-05-15 (×4): 10 mg via INTRAVENOUS
  Filled 2021-05-11 (×4): qty 1

## 2021-05-11 MED ORDER — MORPHINE SULFATE (PF) 2 MG/ML IV SOLN: 1.0000 mg | Freq: Once | INTRAVENOUS | Status: DC

## 2021-05-11 MED ORDER — LISINOPRIL 20 MG PO TABS
20.0000 mg | ORAL_TABLET | Freq: Every day | ORAL | Status: DC
  Administered 2021-05-11 – 2021-05-13 (×3): 20 mg via ORAL
  Filled 2021-05-11 (×3): qty 1

## 2021-05-11 MED ORDER — DORZOLAMIDE HCL-TIMOLOL MAL 2-0.5 % OP SOLN
1.0000 [drp] | Freq: Two times a day (BID) | OPHTHALMIC | Status: DC
  Administered 2021-05-11 – 2021-05-23 (×24): 1 [drp] via OPHTHALMIC
  Filled 2021-05-11: qty 10

## 2021-05-11 MED ORDER — CHLORHEXIDINE GLUCONATE CLOTH 2 % EX PADS
6.0000 | MEDICATED_PAD | Freq: Every day | CUTANEOUS | Status: DC
  Administered 2021-05-11 – 2021-05-23 (×13): 6 via TOPICAL

## 2021-05-11 MED ORDER — BRIMONIDINE TARTRATE 0.2 % OP SOLN
1.0000 [drp] | Freq: Two times a day (BID) | OPHTHALMIC | Status: DC
  Administered 2021-05-11 – 2021-05-23 (×24): 1 [drp] via OPHTHALMIC
  Filled 2021-05-11: qty 5

## 2021-05-11 MED ORDER — CLOPIDOGREL BISULFATE 75 MG PO TABS
75.0000 mg | ORAL_TABLET | Freq: Every day | ORAL | Status: DC
  Administered 2021-05-11 – 2021-05-23 (×12): 75 mg via ORAL
  Filled 2021-05-11 (×12): qty 1

## 2021-05-11 MED ORDER — TAMSULOSIN HCL 0.4 MG PO CAPS
0.4000 mg | ORAL_CAPSULE | Freq: Every day | ORAL | Status: DC
  Administered 2021-05-11 – 2021-05-23 (×12): 0.4 mg via ORAL
  Filled 2021-05-11 (×12): qty 1

## 2021-05-11 MED ORDER — MORPHINE SULFATE (PF) 2 MG/ML IV SOLN
1.0000 mg | Freq: Once | INTRAVENOUS | Status: AC
  Administered 2021-05-11: 1 mg via INTRAVENOUS
  Filled 2021-05-11: qty 1

## 2021-05-11 MED ORDER — ATORVASTATIN CALCIUM 10 MG PO TABS
10.0000 mg | ORAL_TABLET | Freq: Every evening | ORAL | Status: DC
  Administered 2021-05-11 – 2021-05-23 (×11): 10 mg via ORAL
  Filled 2021-05-11 (×11): qty 1

## 2021-05-11 MED ORDER — POTASSIUM CHLORIDE CRYS ER 20 MEQ PO TBCR
40.0000 meq | EXTENDED_RELEASE_TABLET | Freq: Once | ORAL | Status: AC
  Administered 2021-05-11: 40 meq via ORAL
  Filled 2021-05-11: qty 2

## 2021-05-11 NOTE — Evaluation (Signed)
Physical Therapy Evaluation ?Patient Details ?Name: Evan Morales ?MRN: [id number] ?DOB: Aug 17, 1945 ?Today's Date: 05/11/2021 ? ?History of Present Illness ? Pt is 76 yo male admitted 05/10/21 from SNF after syncopal episode (wife reports poor oral intake).  Pt also pulled out foley catheter with gross hematuria. Pt with multiple recent hospital visits 4/5 in ED and sent home then 4/9 in ED , seen by PT and went SNF from ER. Pt has hx of HLD, glaucoma, HTN, CVA, and likely vascular dementia per H and P.  ?Clinical Impression ? Pt admitted with above diagnosis. Pt with significant confusion and agitation at times.  He was unable to provide PLOF - at last visit (4/9) pt's prior level reported as slow but independent with ADLs and could ambulate in house until recently; at that admission pt required mod-max x 2 and SNF recommended.  Today, pt requiring total A x 2 to get to EOB.   Pt not following any commands and becoming agitated that limited further treatment.  Pt's wife unable to care for pt at home; he is below his baseline recommend SNF. Pt currently with functional limitations due to the deficits listed below (see PT Problem List). Pt will benefit from skilled PT to increase their independence and safety with mobility to allow discharge to the venue listed below.   ?   ? Tried to assess orthostatic hypotension: ?Supine 117/95 ?Sitting 106/86 ?Pt refusing to stand, started to get 3 min BP sitting and pt randomly standing for only 2-3 seconds and returned to sitting while BP running - read 121/102 but pt very tense, moving arm, resisting any efforts from therapist so suspect reading inaccurate.    ? ?Recommendations for follow up therapy are one component of a multi-disciplinary discharge planning process, led by the attending physician.  Recommendations may be updated based on patient status, additional functional criteria and insurance authorization. ? ?Follow Up Recommendations Skilled nursing-short term  rehab (<3 hours/day) ? ?  ?Assistance Recommended at Discharge Frequent or constant Supervision/Assistance  ?Patient can return home with the following ? Two people to help with walking and/or transfers;Two people to help with bathing/dressing/bathroom;Assistance with cooking/housework;Assistance with feeding;Direct supervision/assist for medications management;Direct supervision/assist for financial management ? ?  ?Equipment Recommendations BSC/3in1;Wheelchair (measurements PT);Wheelchair cushion (measurements PT);Hospital bed (hoyer)  ?Recommendations for Other Services ?    ?  ?Functional Status Assessment Patient has had a recent decline in their functional status and demonstrates the ability to make significant improvements in function in a reasonable and predictable amount of time.  ? ?  ?Precautions / Restrictions Precautions ?Precautions: Fall  ? ?  ? ?Mobility ? Bed Mobility ?Overal bed mobility: Needs Assistance ?Bed Mobility: Supine to Sit, Sit to Supine ?  ?  ?Supine to sit: Total assist, +2 for physical assistance ?Sit to supine: +2 for physical assistance, Total assist ?  ?General bed mobility comments: Pt not following commands for transfers requring total A of 2 for all aspects of transfers and bed mobility. ?  ? ?Transfers ?Overall transfer level: Needs assistance ?Equipment used: Rolling walker (2 wheels) ?  ?Sit to Stand: Mod assist, +2 physical assistance ?  ?  ?  ?  ?  ?General transfer comment: Tried to cue pt for standing or scooting toward [facility] but he became agitated and stated "NO."  While sitting EOB pt randomly said "stand up" and with mod A of 2 did partial stand but quickly returned to sitting and was scooting forward so had to return  to supine. ?  ? ?Ambulation/Gait ?  ?  ?  ?  ?  ?  ?  ?  ? ?Stairs ?  ?  ?  ?  ?  ? ?Wheelchair Mobility ?  ? ?Modified Rankin (Stroke Patients Only) ?  ? ?  ? ?Balance Overall balance assessment: Needs assistance ?Sitting-balance support: Feet  supported ?Sitting balance-Leahy Scale: Poor ?Sitting balance - Comments: Pt initially requiring mod A progressed to close guard ?  ?Standing balance support: Bilateral upper extremity supported ?Standing balance-Leahy Scale: Zero ?Standing balance comment: mod x 2 unable to stand completely ?  ?  ?  ?  ?  ?  ?  ?  ?  ?  ?  ?   ? ? ? ?Pertinent Vitals/Pain Pain Assessment ?Pain Assessment: Faces ?Faces Pain Scale: Hurts a little bit ?Pain Location: generalized ?Pain Descriptors / Indicators: Grimacing ?Pain Intervention(s): Limited activity within patient's tolerance, Monitored during session, Repositioned  ? ? ?Home Living Family/patient expects to be discharged to:: Skilled nursing facility ?Living Arrangements: Spouse/significant other ?Available Help at Discharge: Family;Available PRN/intermittently ?Type of Home: House ?Home Access: Stairs to enter ?Entrance Stairs-Rails: None ?Entrance Stairs-Number of Steps: 3 ?  ?Home Layout: One level ?Home Equipment: Conservation officer, nature (2 wheels);Cane - single point ?Additional Comments: Doorways in house are not w/c accessible; Obtained from prior visit - pt unable to provide  ?  ?Prior Function Prior Level of Function : Needs assist ?  ?  ?  ?  ?  ?  ?Mobility Comments: Per last admission on 4/9: Uses RW, slow pace, requiring increased assist in past weeks. 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 ?ADLs Comments: Per admission on 4/9: Prior to last week, pt performing ADL's at slower pace, but has had a decline and requiring assist ?  ? ? ?Hand Dominance  ? Dominant Hand: Right ? ?  ?Extremity/Trunk Assessment  ? Upper Extremity Assessment ?Upper Extremity Assessment: Difficult to assess due to impaired cognition (Pt resistant to any movement, no obvious/significant deficits noted) ?  ? ?Lower Extremity Assessment ?Lower Extremity Assessment: Difficult to assess due to impaired cognition (Pt resistant to any movement, no obvious/significant deficits  noted) ?  ? ?Cervical / Trunk Assessment ?Cervical / Trunk Assessment: Kyphotic  ?Communication  ? Communication: No difficulties  ?Cognition Arousal/Alertness: Awake/alert ?Behavior During Therapy: Agitated, Anxious ?Overall Cognitive Status: Impaired/Different from baseline ?Area of Impairment: Memory, Following commands, Problem solving, Orientation, Attention, Safety/judgement, Awareness ?  ?  ?  ?  ?  ?  ?  ?  ?Orientation Level: Disoriented to, Place, Time, Situation ?Current Attention Level: Focused ?Memory: Decreased short-term memory, Decreased recall of precautions ?Following Commands:  (not following commands) ?Safety/Judgement: Decreased awareness of safety, Decreased awareness of deficits ?Awareness: Intellectual ?Problem Solving: Slow processing, Decreased initiation, Requires verbal cues, Difficulty sequencing, Requires tactile cues ?General Comments: Pt very confused and unable to follow any commands.  He has hand mittens and keeps trying to pull off with mouth - did remove 1 for therapy and pt went immediately for telemetry wires requiring cues and constant supervision to prevent pulling lines. Pt unaware in hospital and declined attempts to mobilize stating - "I don't know how to play that game."  PT educated on importance of therapy and mobilizing and assisted to EOB.  At EOB pt more resistant to any other mobility, randomly stood 1 time with assist, but then had to return to supine and yelling "help" with any interaction with  therapist.  Pt calmed when being left alone. ?  ?  ? ?  ?General Comments   ? ?  ?Exercises    ? ?Assessment/Plan  ?  ?PT Assessment Patient needs continued PT services  ?PT Problem List Decreased strength;Decreased activity tolerance;Decreased balance;Decreased mobility;Decreased cognition;Decreased safety awareness;Decreased coordination;Decreased knowledge of use of DME ? ?   ?  ?PT Treatment Interventions DME instruction;Gait training;Functional mobility  training;Therapeutic exercise;Therapeutic activities;Balance training;Patient/family education;Cognitive remediation;Wheelchair mobility training   ? ?PT Goals (Current goals can be found in the Care Plan section)  ?Acute Rehab PT G

## 2021-05-11 NOTE — Progress Notes (Signed)
?  Echocardiogram ?2D Echocardiogram has been performed. ? Evan Morales ?05/11/2021, 8:56 AM ?

## 2021-05-11 NOTE — Progress Notes (Signed)
?PROGRESS NOTE ? ? ? ?Evan Morales  ZOX:[id number] DOB: October 27, 1945 DOA: 05/10/2021 ?PCP: Ria Bush, MD  ? ?  ?Brief Narrative:  ?Evan Morales is a 76 y.o. male with medical history significant of HLD, glaucoma, HTN, CVA.  He presents from skilled nursing facility after passing out. ? ?He was seen in the emergency department 4/5 with chief complaint of weakness.  Work-up was unremarkable.  Was discharged home with wife.  ? ?He was again seen in the emergency department 4/9 with chief complaint of fatigue.  Was found to have acute urinary retention and Foley catheter was placed.  Was evaluated by PT and was discharged to skilled nursing facility from the ER. ? ?He was seen again in the emergency department 4/13 with chief complaint of syncopal episode at skilled nursing facility.  Family had reported that patient was not eating or drinking well in the facility, had a syncopal event, was found to have hypotension with BP 60/70.  Patient at some point had had removed his Foley catheter and had gross hematuria in the emergency department.  Foley catheter replaced. ? ?New events last 24 hours / Subjective: ?Patient is alert and oriented to self only.  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  He is able to tell me his wife's name.  He states that he is thirsty but does not have any other complaints.  Per wife at bedside, since his stroke 2 years ago, his cognition has slowly declined.  She is no longer able to care for him at home.  Previous to hospitalization, patient was able to hold a normal conversation, but is no longer able to drive or do activities at home such as cleaning or cooking. ? ?Assessment & Plan: ? Principal Problem: ?  Syncope ?Active Problems: ?  HYPERCHOLESTEROLEMIA ?  Essential hypertension ?  History of cerebrovascular accident (CVA) with residual deficit ?  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  Hypokalemia ?  Hyperbilirubinemia ?  AKI  (acute kidney injury) ([place]) ?  Gross hematuria ?  Pressure injury of skin ?  Weakness ?  Vascular dementia ([place]) ?  Acute urinary retention ?  Chronic indwelling Foley catheter ? ? ?Syncope ?-Insetting of poor oral intake and dehydration, AKI ?-Echocardiogram showed EF 60 to 81%, grade 2 diastolic dysfunction, no aortic stenosis ?-Orthostatic vital signs pending ? ?AKI ?-Presented with creatinine 1.76.  In setting of poor oral intake. ?-Renal ultrasound unremarkable ?-AKI resolved with IVF  ? ?Hypokalemia ?-Replace, trend ? ?Weakness ?-PT to evaluate ? ?History of CVA ?-Plavix ? ?Likely vascular dementia ?-Delirium precaution ? ?Urinary retention ?-Presented with gross hematuria after removing his Foley catheter and bout of confusion. Foley replaced in the emergency department 4/13 ?-Outpatient urology follow-up ?-Foley catheter today shows yellow urine ?-Flomax ? ?Hyperlipidemia ?-Lipitor ? ?Hypertension ?-Lisinopril ? ? ? ?In agreement with assessment of the pressure ulcer as below:  ?Pressure Injury 05/10/21 Buttocks Left Stage 2 -  Partial thickness loss of dermis presenting as a shallow open injury with a red, pink wound bed without slough. (Active)  ?05/10/21 1909  ?Location: Buttocks  ?Location Orientation: Left  ?Staging: Stage 2 -  Partial thickness loss of dermis presenting as a shallow open injury with a red, pink wound bed without slough.  ?Wound Description (Comments):   ?Present on Admission: Yes  ?Dressing Type Foam - Lift dressing to assess site every shift 05/10/21 2035  ?   ?Pressure  Injury 05/10/21 Buttocks Right Stage 2 -  Partial thickness loss of dermis presenting as a shallow open injury with a red, pink wound bed without slough. (Active)  ?05/10/21 1910  ?Location: Buttocks  ?Location Orientation: Right  ?Staging: Stage 2 -  Partial thickness loss of dermis presenting as a shallow open injury with a red, pink wound bed without slough.  ?Wound Description (Comments):   ?Present on Admission:  Yes  ?Dressing Type Foam - Lift dressing to assess site every shift 05/10/21 2035  ? ? ? ?  ? ? ?DVT prophylaxis:  ?SCDs Start: 05/10/21 1846 ? ?Code Status: Full code ?Family Communication: Wife at bedside ?Disposition Plan:  ?Status is: Observation ?The patient will require care spanning > 2 midnights and should be moved to inpatient because: He will need SNF placement ? ?Antimicrobials:  ?Anti-infectives (From admission, onward)  ? ? None  ? ?  ? ? ? ?Objective: ?Vitals:  ? 05/10/21 2200 05/11/21 0211 05/11/21 0500 05/11/21 0528  ?BP: (!) 147/81 (!) 157/101  (!) 174/117  ?Pulse: 82 87  89  ?Resp:      ?Temp:    99.3 ?F (37.4 ?C)  ?TempSrc:    Oral  ?SpO2:    98%  ?Weight:   91.8 kg   ?Height:      ? ? ?Intake/Output Summary (Last 24 hours) at 05/11/2021 1223 ?Last data filed at 05/11/2021 0656 ?Gross per 24 hour  ?Intake 1590.16 ml  ?Output 2275 ml  ?Net -684.84 ml  ? ?Filed Weights  ? 05/10/21 1119 05/11/21 0500  ?Weight: 90.7 kg 91.8 kg  ? ? ?Examination:  ?General exam: Appears calm  ?Respiratory system: Clear to auscultation. Respiratory effort normal.  ?Cardiovascular system: S1 & S2 heard, RRR. No murmurs. No pedal edema. ?Gastrointestinal system: Abdomen is nondistended, soft and nontender.  ?Central nervous system: Alert and oriented to self only ?Extremities: Symmetric in appearance  ?Skin: No rashes, lesions or ulcers on exposed skin  ? ? ?Data Reviewed: I have personally reviewed following labs and imaging studies ? ?CBC: ?Recent Labs  ?Lab 05/06/21 ?7858 05/10/21 ?1121 05/11/21 ?8502  ?WBC 10.8* 13.9* 11.1*  ?NEUTROABS 8.9* 12.2*  --   ?HGB 15.4 14.6 14.0  ?HCT 45.7 42.1 41.1  ?MCV 97.4 95.7 95.8  ?PLT 200 191 165  ? ?Basic Metabolic Panel: ?Recent Labs  ?Lab 05/06/21 ?7741 05/10/21 ?1121 05/10/21 ?1527 05/11/21 ?2878  ?NA 139 136  --  138  ?K 3.7 3.2*  --  3.4*  ?CL 106 104  --  108  ?CO2 23 26  --  22  ?GLUCOSE 103* 138*  --  102*  ?BUN 11 42*  --  21  ?CREATININE 0.82 1.76*  --  0.61  ?CALCIUM 9.0  8.8*  --  8.6*  ?MG  --   --  1.9  --   ? ?GFR: ?Estimated Creatinine Clearance: 91 mL/min (by C-G formula based on SCr of 0.61 mg/dL). ?Liver Function Tests: ?Recent Labs  ?Lab 05/06/21 ?6767 05/10/21 ?1121 05/11/21 ?2094  ?AST 26 55* 50*  ?ALT '18 28 29  '$ ?ALKPHOS 73 66 61  ?BILITOT 2.4* 2.4* 2.6*  ?PROT 6.6 6.8 6.4*  ?ALBUMIN 4.1 3.6 3.3*  ? ?No results for input(s): LIPASE, AMYLASE in the last 168 hours. ?No results for input(s): AMMONIA in the last 168 hours. ?Coagulation Profile: ?No results for input(s): INR, PROTIME in the last 168 hours. ?Cardiac Enzymes: ?No results for input(s): CKTOTAL, CKMB, CKMBINDEX, TROPONINI in the last 168 hours. ?  BNP (last 3 results) ?No results for input(s): PROBNP in the last 8760 hours. ?HbA1C: ?No results for input(s): HGBA1C in the last 72 hours. ?CBG: ?Recent Labs  ?Lab 05/06/21 ?1819 05/11/21 ?8657 05/11/21 ?8469 05/11/21 ?1204  ?GLUCAP 107* 103* 96 112*  ? ?Lipid Profile: ?No results for input(s): CHOL, HDL, LDLCALC, TRIG, CHOLHDL, LDLDIRECT in the last 72 hours. ?Thyroid Function Tests: ?No results for input(s): TSH, T4TOTAL, FREET4, T3FREE, THYROIDAB in the last 72 hours. ?Anemia Panel: ?No results for input(s): VITAMINB12, FOLATE, FERRITIN, TIBC, IRON, RETICCTPCT in the last 72 hours. ?Sepsis Labs: ?No results for input(s): PROCALCITON, LATICACIDVEN in the last 168 hours. ? ?No results found for this or any previous visit (from the past 240 hour(s)).  ? ? ?Radiology Studies: ?US RENAL ? ?Result Date: 05/10/2021 ?CLINICAL DATA:  Acute kidney injury. EXAM: RENAL / URINARY TRACT ULTRASOUND COMPLETE COMPARISON:  None. FINDINGS: Right Kidney: Renal measurements: 11.2 cm x 5.4 cm x 5.9 cm = volume: 186.92 mL. Echogenicity within normal limits. No mass or hydronephrosis visualized. Left Kidney: Renal measurements: 11.1 cm x 5.4 cm x 5.9 cm = volume: 186.11 mL. Echogenicity within normal limits. No mass or hydronephrosis visualized. Bladder: A Foley catheter is present within the  urinary bladder. Other: None. IMPRESSION: Normal renal ultrasound. Electronically Signed   By: Virgina Norfolk M.D.   On: 05/10/2021 19:51  ? ?DG Chest Portable 1 View ? ?Result Date: 05/10/2021 ?CLINICAL

## 2021-05-11 NOTE — TOC Initial Note (Signed)
Transition of Care (TOC) - Initial/Assessment Note  ? ? ?Patient Details  ?Name: Evan Morales ?MRN: [id number] ?Date of Birth: March 10, 1945 ? ?Transition of Care [facility]) CM/SW Contact:    ?Dessa Phi, RN ?Phone Number: ?05/11/2021, 3:47 PM ? ?Clinical Narrative:   Spoke to spouse Marie-from [facility] does not want him to retun their-she has started process for [facility]-informed her that PT will eval,then we will f/u with choices,& auth.              ? ? ?Expected Discharge Plan: [employer] ?Barriers to Discharge: Continued Medical Work up ? ? ?Patient Goals and CMS Choice ?Patient states their goals for this hospitalization and ongoing recovery are:: Rehab ?CMS Medicare.gov Compare Post Acute Care list provided to:: Patient Represenative (must comment) (spouse [name]) ?Choice offered to / list presented to : Spouse ? ?Expected Discharge Plan and Services ?Expected Discharge Plan: [place] ?  ?Discharge Planning Services: CM Consult ?Post Acute Care Choice: [place] ?Living arrangements for the past 2 months: [place] ?                ?  ?  ?  ?  ?  ?  ?  ?  ?  ?  ? ?Prior Living Arrangements/Services ?Living arrangements for the past 2 months: [place] ?Lives with:: Facility Resident ?  ?       ?  ?  ?  ?  ? ?Activities of Daily Living ?Home Assistive Devices/Equipment: Eyeglasses, Grab bars around toilet, Grab bars in shower, Hand-held shower hose, Blood pressure cuff, CBG Meter, Scales, Walker (specify type), Wheelchair ?ADL Screening (condition at time of admission) ?Patient's cognitive ability adequate to safely complete daily activities?: Yes ?Is the patient deaf or have difficulty hearing?: No ?Does the patient have difficulty seeing, even when wearing glasses/contacts?: Yes (blindness in right eye, glaucoma, legally blind) ?Does the patient have difficulty concentrating, remembering, or making decisions?: Yes (residual memory  loss from previous stroke) ?Patient able to express need for assistance with ADLs?: Yes ?Does the patient have difficulty dressing or bathing?: Yes (secondary to weakness and dizziness) ?Independently performs ADLs?: No (secondary to weakness and dizziness) ?Communication: Independent ?Dressing (OT): Needs assistance ?Is this a change from baseline?: Pre-admission baseline ?Grooming: Needs assistance ?Is this a change from baseline?: Pre-admission baseline ?Feeding: Needs assistance ?Is this a change from baseline?: Pre-admission baseline ?Bathing: Needs assistance ?Is this a change from baseline?: Pre-admission baseline ?Toileting: Needs assistance ?Is this a change from baseline?: Pre-admission baseline ?In/Out Bed: Needs assistance ?Is this a change from baseline?: Pre-admission baseline ?Walks in Home: Needs assistance ?Is this a change from baseline?: Pre-admission baseline ?Does the patient have difficulty walking or climbing stairs?: Yes (secondary to weakness and dizziness) ?Weakness of Legs: Both ?Weakness of Arms/Hands: Both ? ?Permission Sought/Granted ?  ?  ?   ?   ?   ?   ? ?Emotional Assessment ?  ?  ?  ?  ?  ?  ? ?Admission diagnosis:  Syncope [R55] ?Patient Active Problem List  ? Diagnosis Date Noted  ? Pressure injury of skin 05/11/2021  ? Weakness 05/11/2021  ? Vascular dementia ([place]) 05/11/2021  ? Acute urinary retention 05/11/2021  ? Chronic indwelling Foley catheter 05/11/2021  ? Syncope 05/10/2021  ? Hypokalemia 05/10/2021  ? Hyperbilirubinemia 05/10/2021  ? AKI (acute kidney injury) ([place]) 05/10/2021  ? Gross hematuria 05/10/2021  ? 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 Chronic  pain of both shoulders 01/30/2016  ? Vitamin B12 deficiency 07/27/2015  ? Overweight with body mass index (BMI) 25.0-29.9 07/27/2015  ? 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 Advanced care planning/counseling discussion 07/26/2014  ? Health maintenance examination 07/26/2014  ? Dysthymia 07/26/2014  ? 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 Medicare annual wellness visit, subsequent 07/13/2012  ? Constipation 04/09/2012  ? Vision loss, bilateral 04/06/2012  ? Bilateral carotid artery stenosis 04/06/2012  ? History of cerebrovascular accident (CVA) with residual deficit 03/27/2012  ? Prediabetes 07/05/2010  ? HYPERCHOLESTEROLEMIA 05/04/2009  ? ECZEMA 05/05/2007  ? Essential hypertension 05/13/2006  ? COLONIC POLYPS, ADENOMATOUS 05/15/2005  ? ?PCP:  Ria Bush, MD ?Pharmacy:   ?[facility] [place],  - [address], [address] ?[address], [address] ?[place] [address] ?Phone: [phone] Fax: [phone] ? ?[id number]- [address]?[place]?Phone: [phone]: [phone]? ? ? ? ?Social Determinants of Health (SDOH) Interventions ?  ? ?Readmission Risk Interventions ?   ? View : No data to display.  ?  ?  ?  ? ? ? ?

## 2021-05-12 DIAGNOSIS — R55 Syncope and collapse: Secondary | ICD-10-CM | POA: Diagnosis not present

## 2021-05-12 LAB — COMPREHENSIVE METABOLIC PANEL
ALT: 26 U/L (ref 0–44)
AST: 39 U/L (ref 15–41)
Albumin: 3.2 g/dL — ABNORMAL LOW (ref 3.5–5.0)
Alkaline Phosphatase: 60 U/L (ref 38–126)
Anion gap: 8 (ref 5–15)
BUN: 20 mg/dL (ref 8–23)
CO2: 23 mmol/L (ref 22–32)
Calcium: 8.4 mg/dL — ABNORMAL LOW (ref 8.9–10.3)
Chloride: 107 mmol/L (ref 98–111)
Creatinine, Ser: 0.68 mg/dL (ref 0.61–1.24)
GFR, Estimated: >60 mL/min (ref >60)
Glucose, Bld: 103 mg/dL — ABNORMAL HIGH (ref 70–99)
Potassium: 3.5 mmol/L (ref 3.5–5.1)
Sodium: 138 mmol/L (ref 135–145)
Total Bilirubin: 2 mg/dL — ABNORMAL HIGH (ref 0.3–1.2)
Total Protein: 6.4 g/dL — ABNORMAL LOW (ref 6.5–8.1)

## 2021-05-12 LAB — CBC
HCT: 39.9 % (ref 39.0–52.0)
Hemoglobin: 13.4 g/dL (ref 13.0–17.0)
MCH: 32.4 pg (ref 26.0–34.0)
MCHC: 33.6 g/dL (ref 30.0–36.0)
MCV: 96.4 fL (ref 80.0–100.0)
Platelets: 176 10*3/uL (ref 150–400)
RBC: 4.14 MIL/uL — ABNORMAL LOW (ref 4.22–5.81)
RDW: 12.5 % (ref 11.5–15.5)
WBC: 15.1 10*3/uL — ABNORMAL HIGH (ref 4.0–10.5)
nRBC: 0 % (ref 0.0–0.2)

## 2021-05-12 LAB — GLUCOSE, CAPILLARY
Glucose-Capillary: 95 mg/dL (ref 70–99)
Glucose-Capillary: 99 mg/dL (ref 70–99)
Glucose-Capillary: 138 mg/dL — ABNORMAL HIGH (ref 70–99)

## 2021-05-12 LAB — MRSA NEXT GEN BY PCR, NASAL: MRSA by PCR Next Gen: NOT DETECTED

## 2021-05-12 LAB — MAGNESIUM: Magnesium: 2.2 mg/dL (ref 1.7–2.4)

## 2021-05-12 MED ORDER — ORAL CARE MOUTH RINSE
15.0000 mL | Freq: Two times a day (BID) | OROMUCOSAL | Status: DC
  Administered 2021-05-12 – 2021-05-23 (×20): 15 mL via OROMUCOSAL

## 2021-05-12 NOTE — Progress Notes (Signed)
?PROGRESS NOTE ? ? ? ?Evan Morales  GUR:[id number] DOB: 18-Oct-1945 DOA: 05/10/2021 ?PCP: Ria Bush, MD  ? ?  ?Brief Narrative:  ?Evan Morales is a 76 y.o. male with medical history significant of HLD, glaucoma, HTN, CVA.  He presents from skilled nursing facility after passing out. ?  ?He was seen in the emergency department 4/5 with chief complaint of weakness.  Work-up was unremarkable.  Was discharged home with wife.  ?  ?He was again seen in the emergency department 4/9 with chief complaint of fatigue.  Was found to have acute urinary retention and Foley catheter was placed.  Was evaluated by PT and was discharged to skilled nursing facility from the ER. ?  ?He was seen again in the emergency department 4/13 with chief complaint of syncopal episode at skilled nursing facility.  Family had reported that patient was not eating or drinking well in the facility, had a syncopal event, was found to have hypotension with BP 60/70.  Patient at some point had had removed his Foley catheter and had gross hematuria in the emergency department.  Foley catheter replaced. ? ?New events last 24 hours / Subjective: ?Acute kidney injury has resolved.  He is alert to self only still.  States the year is 2024, openly admits he does not know where he is.  States the president is Abigail Miyamoto.  His wife and son are bedside.  He is awaiting SNF placement. ? ?Assessment & Plan: ?  ?Principal Problem: ?  Syncope ?Active Problems: ?  HYPERCHOLESTEROLEMIA ?  Essential hypertension ?  History of cerebrovascular accident (CVA) with residual deficit ?  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  Hypokalemia ?  Hyperbilirubinemia ?  AKI (acute kidney injury) ([place]) ?  Gross hematuria ?  Pressure injury of skin ?  Weakness ?  Vascular dementia ([place]) ?  Acute urinary retention ?  Chronic indwelling Foley catheter ? ?Syncope ? Multifactorial-AKI, dehydration, poor oral intake ? Echo was largely  unremarkable ? Unable to fully complete orthostatics but no significant drop from laying to sitting ? ?AKI ([place]) ? Resolved, monitor ? Percent unremarkable ? ?Hypokalemia ? Resolved, monitor ? ?Weakness ? Commended SNF ? ?History of CVA ? Plavix 75 mg daily ? ?Vascular dementia ([place]) ? Delirium precautions ? ?Urinary retention/gross hematuria ? Patient forcibly removed his Foley catheter in a bout of confusion, hematuria ensued, Foley catheter replaced in ED, urine output clear/yellow ? Continue Flomax 0.4 mg daily ? Outpatient urology follow-up ? ?Hyperlipidemia ? Lipitor 10 mg daily ? ?Hypertension ? Lisinopril 20 mg daily ? Hydralazine as needed ? ?In agreement with assessment of the pressure ulcer as below:  ?Pressure Injury 05/10/21 Buttocks Left Stage 2 -  Partial thickness loss of dermis presenting as a shallow open injury with a red, pink wound bed without slough. (Active)  ?05/10/21 1909  ?Location: Buttocks  ?Location Orientation: Left  ?Staging: Stage 2 -  Partial thickness loss of dermis presenting as a shallow open injury with a red, pink wound bed without slough.  ?Wound Description (Comments):   ?Present on Admission: Yes  ?Dressing Type Foam - Lift dressing to assess site every shift 05/11/21 2305  ?   ?Pressure Injury 05/10/21 Buttocks Right Stage 2 -  Partial thickness loss of dermis presenting as a shallow open injury with a red, pink wound bed without slough. (Active)  ?05/10/21 1910  ?Location: Buttocks  ?Location Orientation: Right  ?Staging: Stage 2 -  Partial thickness loss of dermis presenting as a shallow open injury with a red, pink wound bed without slough.  ?Wound Description (Comments):   ?Present on Admission: Yes  ?Dressing Type Foam - Lift dressing to assess site every shift 05/11/21 2305  ? ?DVT prophylaxis: SCDs ?Code Status: Full ?Family Communication: Wife and son at bedside ?Coming From: Home ?Disposition Plan: SNF ?Barriers to Discharge: SNF placement ? ? ?Objective: ?Vitals:   ? 05/11/21 2249 05/12/21 0500 05/12/21 0530 05/12/21 1058  ?BP: (!) 149/94  138/76 (!) 104/92  ?Pulse: 89  74 71  ?Resp: '20  20 16  '$ ?Temp: 99.2 ?F (37.3 ?C)  98.1 ?F (36.7 ?C) 97.6 ?F (36.4 ?C)  ?TempSrc: Oral  Oral Axillary  ?SpO2: 95%  96% 97%  ?Weight:  87.9 kg    ?Height:      ? ? ?Intake/Output Summary (Last 24 hours) at 05/12/2021 1218 ?Last data filed at 05/12/2021 1145 ?Gross per 24 hour  ?Intake 693.61 ml  ?Output 1650 ml  ?Net -956.39 ml  ? ?Filed Weights  ? 05/10/21 1119 05/11/21 0500 05/12/21 0500  ?Weight: 90.7 kg 91.8 kg 87.9 kg  ? ? ?Examination:  ?General exam: Appears calm and comfortable  ?Respiratory system: Clear to auscultation. Respiratory effort normal. No respiratory distress. No conversational dyspnea.  ?Cardiovascular system: S1 & S2 heard, RRR. No murmurs. No pedal edema. ?Gastrointestinal system: Abdomen is nondistended, soft and nontender. Normal bowel sounds heard. ?Central nervous system: Alert and oriented to person only. Speech clear.  ?Extremities: Symmetric in appearance  ?Skin: No rashes, lesions or ulcers on exposed skin  ?Psychiatry: Judgement and insight appear limited. Mood & affect appropriate.  ? ?Data Reviewed: I have personally reviewed following labs and imaging studies ? ?CBC: ?Recent Labs  ?Lab 05/06/21 ?1017 05/10/21 ?1121 05/11/21 ?5102 05/12/21 ?0530  ?WBC 10.8* 13.9* 11.1* 15.1*  ?NEUTROABS 8.9* 12.2*  --   --   ?HGB 15.4 14.6 14.0 13.4  ?HCT 45.7 42.1 41.1 39.9  ?MCV 97.4 95.7 95.8 96.4  ?PLT 200 191 165 176  ? ?Basic Metabolic Panel: ?Recent Labs  ?Lab 05/06/21 ?5852 05/10/21 ?1121 05/10/21 ?1527 05/11/21 ?7782 05/12/21 ?0530  ?NA 139 136  --  138 138  ?K 3.7 3.2*  --  3.4* 3.5  ?CL 106 104  --  108 107  ?CO2 23 26  --  22 23  ?GLUCOSE 103* 138*  --  102* 103*  ?BUN 11 42*  --  21 20  ?CREATININE 0.82 1.76*  --  0.61 0.68  ?CALCIUM 9.0 8.8*  --  8.6* 8.4*  ?MG  --   --  1.9  --  2.2  ? ?GFR: ?Estimated Creatinine Clearance: 83.7 mL/min (by C-G formula based on SCr  of 0.68 mg/dL). ? ?Liver Function Tests: ?Recent Labs  ?Lab 05/06/21 ?4235 05/10/21 ?1121 05/11/21 ?3614 05/12/21 ?0530  ?AST 26 55* 50* 39  ?ALT '18 28 29 26  '$ ?ALKPHOS 43 15 40 08  ?BILITOT 2.4* 2.4* 2.6* 2.0*  ?PROT 6.6 6.8 6.4* 6.4*  ?ALBUMIN 4.1 3.6 3.3* 3.2*  ? ?CBG: ?Recent Labs  ?Lab 05/11/21 ?6761 05/11/21 ?9509 05/11/21 ?1204 05/12/21 ?3267 05/12/21 ?0744  ?GLUCAP 103* 96 112* 95 99  ? ?Recent Results (from the past 240 hour(s))  ?MRSA Next Gen by PCR, Nasal     Status: None  ? Collection Time: 05/12/21  5:53 AM  ? Specimen: Nasal Mucosa; Nasal Swab  ?Result Value Ref Range Status  ? MRSA by PCR Next Gen  NOT DETECTED NOT DETECTED Final  ?  Comment: (NOTE) ?The GeneXpert MRSA Assay (FDA approved for NASAL specimens only), ?is one component of a comprehensive MRSA colonization surveillance ?program. It is not intended to diagnose MRSA infection nor to guide ?or monitor treatment for MRSA infections. ?Test performance is not FDA approved in patients less than 2 years ?old. ?Performed at [facility], [address] ?[place], [place] [address] ?  ?  ? ? ?Radiology Studies: ?US RENAL ? ?Result Date: 05/10/2021 ?CLINICAL DATA:  Acute kidney injury. EXAM: RENAL / URINARY TRACT ULTRASOUND COMPLETE COMPARISON:  None. FINDINGS: Right Kidney: Renal measurements: 11.2 cm x 5.4 cm x 5.9 cm = volume: 186.92 mL. Echogenicity within normal limits. No mass or hydronephrosis visualized. Left Kidney: Renal measurements: 11.1 cm x 5.4 cm x 5.9 cm = volume: 186.11 mL. Echogenicity within normal limits. No mass or hydronephrosis visualized. Bladder: A Foley catheter is present within the urinary bladder. Other: None. IMPRESSION: Normal renal ultrasound. Electronically Signed   By: Virgina Norfolk M.D.   On: 05/10/2021 19:51  ? ?ECHOCARDIOGRAM COMPLETE ? ?Result Date: 05/11/2021 ?   ECHOCARDIOGRAM REPORT   Patient Name:   LENO MATHES Date of Exam: 05/11/2021 Medical Rec #:  [id number]           Height:        71.0 in Accession #:    [id number]          Weight:       202.4 lb Date of Birth:  25-Dec-1945           BSA:          2.119 m? Patient Age:    69 years            BP:           174/117 mmHg Patient

## 2021-05-12 NOTE — NC FL2 (Signed)
?[place] MEDICAID FL2 LEVEL OF CARE SCREENING TOOL  ?  ? ?IDENTIFICATION  ?Patient Name: ?Evan Morales Birthdate: October 01, 1945 Sex: male Admission Date (Current Location): ?05/10/2021  ?[place] and [place] Number: ? [place] ?  Facility and Address:  ?[facility],  [place], [place] ?     Provider Number: ?[id number]  ?Attending Physician Name and Address:  ?Zyrell, [place],* ? Relative Name and Phone Number:  ?Benjamen Koelling (Spouse)   [phone] ?   ?Current Level of Care: ?Hospital Recommended Level of Care: ?[place] Prior Approval Number: ?  ? ?Date Approved/Denied: ?05/06/21 PASRR Number: ?[id number] A ? ?Discharge Plan: ?SNF ?  ? ?Current Diagnoses: ?Patient Active Problem List  ? Diagnosis Date Noted  ? Pressure injury of skin 05/11/2021  ? Weakness 05/11/2021  ? Vascular dementia ([place]) 05/11/2021  ? Acute urinary retention 05/11/2021  ? Chronic indwelling Foley catheter 05/11/2021  ? Syncope 05/10/2021  ? Hypokalemia 05/10/2021  ? Hyperbilirubinemia 05/10/2021  ? AKI (acute kidney injury) ([place]) 05/10/2021  ? Gross hematuria 05/10/2021  ? 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 Chronic pain of both shoulders 01/30/2016  ? Vitamin B12 deficiency 07/27/2015  ? Overweight with body mass index (BMI) 25.0-29.9 07/27/2015  ? 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 Advanced care planning/counseling discussion 07/26/2014  ? Health maintenance examination 07/26/2014  ? Dysthymia 07/26/2014  ? 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 Medicare annual wellness visit, subsequent 07/13/2012  ? Constipation 04/09/2012  ? Vision loss, bilateral 04/06/2012  ? Bilateral carotid artery stenosis 04/06/2012  ? History of cerebrovascular accident (CVA) with residual deficit 03/27/2012  ? Prediabetes 07/05/2010  ?  HYPERCHOLESTEROLEMIA 05/04/2009  ? ECZEMA 05/05/2007  ? Essential hypertension 05/13/2006  ? COLONIC POLYPS, ADENOMATOUS 05/15/2005  ? ? ?Orientation RESPIRATION BLADDER Height & Weight   ?  ?Self ? Normal External catheter Weight: 87.9 kg ?Height:  '5\' 11"'$  (180.3 cm)  ?BEHAVIORAL SYMPTOMS/MOOD NEUROLOGICAL BOWEL NUTRITION STATUS  ?    Continent Diet (heart healthy)  ?AMBULATORY STATUS COMMUNICATION OF NEEDS Skin   ?Extensive Assist Verbally Bruising ?  ?  ?  ?    ?     ?     ? ? ?Personal Care Assistance Level of Assistance  ?Bathing, Feeding, Dressing Bathing Assistance: Limited assistance ?Feeding assistance: Limited assistance ?Dressing Assistance: Limited assistance ?   ? ?Functional Limitations Info  ?Sight, Speech, Hearing Sight Info: Adequate ?Hearing Info: Adequate ?Speech Info: Adequate  ? ? ?SPECIAL CARE FACTORS FREQUENCY  ?PT (By licensed PT), OT (By licensed OT)   ?  ?PT Frequency: 5x/week ?OT Frequency: 5x/week ?  ?  ?  ?   ? ? ?Contractures Contractures Info: Not present  ? ? ?Additional Factors Info  ?Code Status, Allergies Code Status Info: Full ?Allergies Info: No known allergies ?  ?  ?  ?   ? ?Current Medications (05/12/2021):  This is the current hospital active medication list ?Current Facility-Administered Medications  ?Medication Dose Route Frequency Provider Last Rate Last Admin  ? atorvastatin (LIPITOR) tablet 10 mg  10 mg Oral QPM Kyle, Tyrone A, DO   10 mg at 05/11/21 2305  ? brimonidine (ALPHAGAN) 0.2 % ophthalmic solution 1 drop  1 drop Right Eye BID Marylyn Ishihara, Tyrone A, DO   1 drop at 05/12/21 0956  ? Chlorhexidine Gluconate Cloth 2 %  PADS 6 each  6 each Topical Daily Dessa Phi, DO   6 each at 05/12/21 1136  ? clopidogrel (PLAVIX) tablet 75 mg  75 mg Oral Daily Dessa Phi, DO   75 mg at 05/12/21 0946  ? dorzolamide-timolol (COSOPT) 22.3-6.8 MG/ML ophthalmic solution 1 drop  1 drop Right Eye BID Marylyn Ishihara, Tyrone A, DO   1 drop at 05/12/21 0955  ? hydrALAZINE (APRESOLINE) injection 10 mg   10 mg Intravenous Q4H PRN Dessa Phi, DO   10 mg at 05/11/21 1437  ? lisinopril (ZESTRIL) tablet 20 mg  20 mg Oral Daily Kyle, Tyrone A, DO   20 mg at 05/12/21 0946  ? MEDLINE mouth rinse  15 mL Mouth Rinse BID Dessa Phi, DO   15 mL at 05/12/21 1000  ? morphine (PF) 2 MG/ML injection 1 mg  1 mg Intravenous Once Hollace Hayward K, NP      ? sodium chloride flush (NS) 0.9 % injection 3 mL  3 mL Intravenous Q12H Kyle, Tyrone A, DO   3 mL at 05/12/21 1524  ? tamsulosin (FLOMAX) capsule 0.4 mg  0.4 mg Oral Daily Kyle, Tyrone A, DO   0.4 mg at 05/12/21 0946  ? ? ? ?Discharge Medications: ?Please see discharge summary for a list of discharge medications. ? ?Relevant Imaging Results: ? ?Relevant Lab Results: ? ? ?Additional Information ?SS# [id number]; covid-19 vaccine - Pfizer x4 ? ?Tawanna Cooler, RN ? ? ? ? ?

## 2021-05-13 DIAGNOSIS — R55 Syncope and collapse: Secondary | ICD-10-CM | POA: Diagnosis not present

## 2021-05-13 LAB — CBC
HCT: 45.5 % (ref 39.0–52.0)
Hemoglobin: 15.7 g/dL (ref 13.0–17.0)
MCH: 32.8 pg (ref 26.0–34.0)
MCHC: 34.5 g/dL (ref 30.0–36.0)
MCV: 95 fL (ref 80.0–100.0)
Platelets: 185 10*3/uL (ref 150–400)
RBC: 4.79 MIL/uL (ref 4.22–5.81)
RDW: 12.4 % (ref 11.5–15.5)
WBC: 15.3 10*3/uL — ABNORMAL HIGH (ref 4.0–10.5)
nRBC: 0 % (ref 0.0–0.2)

## 2021-05-13 LAB — COMPREHENSIVE METABOLIC PANEL
ALT: 38 U/L (ref 0–44)
AST: 57 U/L — ABNORMAL HIGH (ref 15–41)
Albumin: 3.4 g/dL — ABNORMAL LOW (ref 3.5–5.0)
Alkaline Phosphatase: 71 U/L (ref 38–126)
Anion gap: 10 (ref 5–15)
BUN: 28 mg/dL — ABNORMAL HIGH (ref 8–23)
CO2: 22 mmol/L (ref 22–32)
Calcium: 8.9 mg/dL (ref 8.9–10.3)
Chloride: 107 mmol/L (ref 98–111)
Creatinine, Ser: 0.63 mg/dL (ref 0.61–1.24)
GFR, Estimated: >60 mL/min (ref >60)
Glucose, Bld: 109 mg/dL — ABNORMAL HIGH (ref 70–99)
Potassium: 3.3 mmol/L — ABNORMAL LOW (ref 3.5–5.1)
Sodium: 139 mmol/L (ref 135–145)
Total Bilirubin: 2.1 mg/dL — ABNORMAL HIGH (ref 0.3–1.2)
Total Protein: 6.8 g/dL (ref 6.5–8.1)

## 2021-05-13 LAB — GLUCOSE, CAPILLARY
Glucose-Capillary: 112 mg/dL — ABNORMAL HIGH (ref 70–99)
Glucose-Capillary: 103 mg/dL — ABNORMAL HIGH (ref 70–99)
Glucose-Capillary: 158 mg/dL — ABNORMAL HIGH (ref 70–99)
Glucose-Capillary: 124 mg/dL — ABNORMAL HIGH (ref 70–99)

## 2021-05-13 MED ORDER — OXYCODONE HCL 5 MG PO TABS
5.0000 mg | ORAL_TABLET | ORAL | Status: DC | PRN
Start: 2021-05-13 — End: 2021-05-14
  Administered 2021-05-13 – 2021-05-14 (×2): 5 mg via ORAL
  Filled 2021-05-13 (×3): qty 1

## 2021-05-13 MED ORDER — MORPHINE SULFATE (PF) 2 MG/ML IV SOLN: 2.0000 mg | INTRAVENOUS | Status: DC | PRN

## 2021-05-13 MED ORDER — ACETAMINOPHEN 325 MG PO TABS
650.0000 mg | ORAL_TABLET | Freq: Four times a day (QID) | ORAL | Status: DC | PRN
  Administered 2021-05-14 – 2021-05-21 (×10): 650 mg via ORAL
  Filled 2021-05-13 (×11): qty 2

## 2021-05-13 MED ORDER — POTASSIUM CHLORIDE CRYS ER 20 MEQ PO TBCR
40.0000 meq | EXTENDED_RELEASE_TABLET | Freq: Once | ORAL | Status: AC
  Administered 2021-05-13: 40 meq via ORAL
  Filled 2021-05-13: qty 2

## 2021-05-13 NOTE — Plan of Care (Signed)
  Problem: Elimination: Goal: Will not experience complications related to bowel motility Outcome: Progressing Goal: Will not experience complications related to urinary retention Outcome: Progressing   

## 2021-05-13 NOTE — Progress Notes (Signed)
?PROGRESS NOTE ? ? ? ?Evan Morales  SAY:[id number] DOB: Dec 15, 1945 DOA: 05/10/2021 ?PCP: Ria Bush, MD  ? ? ?Brief Narrative:  ?76 y.o. male with medical history significant of HLD, glaucoma, HTN, CVA.  He presents from skilled nursing facility after passing out. ?  ?He was seen in the emergency department 4/5 with chief complaint of weakness.  Work-up was unremarkable.  Was discharged home with wife.  ?  ?He was again seen in the emergency department 4/9 with chief complaint of fatigue.  Was found to have acute urinary retention and Foley catheter was placed.  Was evaluated by PT and was discharged to skilled nursing facility from the ER. ?  ?He was seen again in the emergency department 4/13 with chief complaint of syncopal episode at skilled nursing facility.  Family had reported that patient was not eating or drinking well in the facility, had a syncopal event, was found to have hypotension with BP 60/70.  Patient at some point had had removed his Foley catheter and had gross hematuria in the emergency department.  Foley catheter replaced. ? ?4/16: Mental status appears unchanged from a prior evaluation.  Alert and oriented x1.  Wife remains at bedside. ? ? ?Assessment & Plan: ?  ?Principal Problem: ?  Syncope ?Active Problems: ?  HYPERCHOLESTEROLEMIA ?  Essential hypertension ?  History of cerebrovascular accident (CVA) with residual deficit ?  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  Hypokalemia ?  Hyperbilirubinemia ?  AKI (acute kidney injury) ([place]) ?  Gross hematuria ?  Pressure injury of skin ?  Weakness ?  Vascular dementia ([place]) ?  Acute urinary retention ?  Chronic indwelling Foley catheter ? ?Syncope ?            Multifactorial-AKI, dehydration, poor oral intake ?            Echo was largely unremarkable ?            Unable to fully complete orthostatics but no significant drop from laying to sitting ? Therapy and orthostatics as tolerated ?  ?AKI ([place]) ?             Resolved, monitor ?            Percent unremarkable ?  ?Hypokalemia ?            Resolved, monitor ?  ?Weakness ?            SNF recommended ? TOC engaged.  Pending insurance authorization ?  ?History of CVA ?            Plavix 75 mg daily ?  ?Vascular dementia ([place]) ?            Delirium precautions ?  ?Urinary retention/gross hematuria ?Patient forcibly removed his Foley catheter in a bout of confusion, hematuria ensued, Foley catheter replaced in ED, urine output clear/yellow ?Plan: ?Mittens in place for patient's safety ?Continue Flomax 0.4 mg daily ?Recommend maintenance of Foley catheter with outpatient urology follow-up ?  ?Hyperlipidemia ?            Lipitor 10 mg daily ?  ?Hypertension ?            Lisinopril 20 mg daily ?            Hydralazine as needed ? ? ?DVT prophylaxis: SCD ?Code Status: Full ?Family Communication: Wife at bedside 4/16 ?Disposition Plan: Status is: Inpatient ?Remains inpatient appropriate because: Unsafe discharge plan.  Pending insurance authorization for discharge to skilled nursing facility ? ? ?Level of care: Telemetry ? ?Consultants:  ?None ? ?Procedures:  ?Foley catheter placement 4/13 ? ?Antimicrobials: ?None ? ? ?Subjective: ?Seen and examined.  Resting in bed.  Appears confused but does answer questions ? ?Objective: ?Vitals:  ? 05/12/21 2113 05/13/21 0454 05/13/21 0500 05/13/21 1028  ?BP: 124/68 (!) 193/113  115/74  ?Pulse: 75 83    ?Resp: 18 20    ?Temp: 98 ?F (36.7 ?C) 98 ?F (36.7 ?C)    ?TempSrc: Oral     ?SpO2:  97%    ?Weight:   88.5 kg   ?Height:      ? ? ?Intake/Output Summary (Last 24 hours) at 05/13/2021 1107 ?Last data filed at 05/13/2021 1031 ?Gross per 24 hour  ?Intake 123 ml  ?Output 150 ml  ?Net -27 ml  ? ?Filed Weights  ? 05/11/21 0500 05/12/21 0500 05/13/21 0500  ?Weight: 91.8 kg 87.9 kg 88.5 kg  ? ? ?Examination: ? ?General exam: NAD.  Appears confused.  Chronically ill appearing ?Respiratory system: Lungs clear.  Normal work of breathing.  Room  air ?Cardiovascular system: S1-S2, RRR, no murmurs, no pedal edema ?Gastrointestinal system: Soft, NT/ND, normal bowel sounds ?Central nervous system: Alert, oriented x1, no focal deficits ?Extremities: Diffusely decreased power bilaterally ?Skin: No rashes, lesions or ulcers ?Psychiatry: Judgement and insight appear impaired. Mood & affect confused.  ? ? ? ?Data Reviewed: I have personally reviewed following labs and imaging studies ? ?CBC: ?Recent Labs  ?Lab 05/10/21 ?1121 05/11/21 ?3546 05/12/21 ?0530 05/13/21 ?0502  ?WBC 13.9* 11.1* 15.1* 15.3*  ?NEUTROABS 12.2*  --   --   --   ?HGB 14.6 14.0 13.4 15.7  ?HCT 42.1 41.1 39.9 45.5  ?MCV 95.7 95.8 96.4 95.0  ?PLT 191 165 176 185  ? ?Basic Metabolic Panel: ?Recent Labs  ?Lab 05/10/21 ?1121 05/10/21 ?1527 05/11/21 ?5681 05/12/21 ?0530 05/13/21 ?0502  ?NA 136  --  138 138 139  ?K 3.2*  --  3.4* 3.5 3.3*  ?CL 104  --  108 107 107  ?CO2 26  --  '22 23 22  '$ ?GLUCOSE 138*  --  102* 103* 109*  ?BUN 42*  --  21 20 28*  ?CREATININE 1.76*  --  0.61 0.68 0.63  ?CALCIUM 8.8*  --  8.6* 8.4* 8.9  ?MG  --  1.9  --  2.2  --   ? ?GFR: ?Estimated Creatinine Clearance: 83.7 mL/min (by C-G formula based on SCr of 0.63 mg/dL). ?Liver Function Tests: ?Recent Labs  ?Lab 05/10/21 ?1121 05/11/21 ?2751 05/12/21 ?0530 05/13/21 ?0502  ?AST 55* 50* 39 57*  ?ALT '28 29 26 '$ 38  ?ALKPHOS 66 61 60 71  ?BILITOT 2.4* 2.6* 2.0* 2.1*  ?PROT 6.8 6.4* 6.4* 6.8  ?ALBUMIN 3.6 3.3* 3.2* 3.4*  ? ?No results for input(s): LIPASE, AMYLASE in the last 168 hours. ?No results for input(s): AMMONIA in the last 168 hours. ?Coagulation Profile: ?No results for input(s): INR, PROTIME in the last 168 hours. ?Cardiac Enzymes: ?No results for input(s): CKTOTAL, CKMB, CKMBINDEX, TROPONINI in the last 168 hours. ?BNP (last 3 results) ?No results for input(s): PROBNP in the last 8760 hours. ?HbA1C: ?No results for input(s): HGBA1C in the last 72 hours. ?CBG: ?Recent Labs  ?Lab 05/12/21 ?7001 05/12/21 ?0744 05/12/21 ?1635  05/13/21 ?7494 05/13/21 ?0810  ?GLUCAP 95 99 138* 112* 103*  ? ?Lipid Profile: ?No results for input(s): CHOL, HDL, LDLCALC, TRIG, CHOLHDL, LDLDIRECT in the  last 72 hours. ?Thyroid Function Tests: ?No results for input(s): TSH, T4TOTAL, FREET4, T3FREE, THYROIDAB in the last 72 hours. ?Anemia Panel: ?No results for input(s): VITAMINB12, FOLATE, FERRITIN, TIBC, IRON, RETICCTPCT in the last 72 hours. ?Sepsis Labs: ?No results for input(s): PROCALCITON, LATICACIDVEN in the last 168 hours. ? ?Recent Results (from the past 240 hour(s))  ?MRSA Next Gen by PCR, Nasal     Status: None  ? Collection Time: 05/12/21  5:53 AM  ? Specimen: Nasal Mucosa; Nasal Swab  ?Result Value Ref Range Status  ? MRSA by PCR Next Gen NOT DETECTED NOT DETECTED Final  ?  Comment: (NOTE) ?The GeneXpert MRSA Assay (FDA approved for NASAL specimens only), ?is one component of a comprehensive MRSA colonization surveillance ?program. It is not intended to diagnose MRSA infection nor to guide ?or monitor treatment for MRSA infections. ?Test performance is not FDA approved in patients less than 2 years ?old. ?Performed at [facility], [address] ?[place], [place] [address] ?  ?  ? ? ? ? ? ?Radiology Studies: ?No results found. ? ? ? ? ? ?Scheduled Meds: ? atorvastatin  10 mg Oral QPM  ? brimonidine  1 drop Right Eye BID  ? Chlorhexidine Gluconate Cloth  6 each Topical Daily  ? clopidogrel  75 mg Oral Daily  ? dorzolamide-timolol  1 drop Right Eye BID  ? lisinopril  20 mg Oral Daily  ? mouth rinse  15 mL Mouth Rinse BID  ?  morphine injection  1 mg Intravenous Once  ? sodium chloride flush  3 mL Intravenous Q12H  ? tamsulosin  0.4 mg Oral Daily  ? ?Continuous Infusions: ? ? LOS: 2 days  ? ? ? ? ? ?Sidney Ace, MD ?[facility] ? ? ?If 7PM-7AM, please contact night-coverage ? ?05/13/2021, 11:07 AM  ? ?

## 2021-05-14 DIAGNOSIS — Z978 Presence of other specified devices: Secondary | ICD-10-CM | POA: Diagnosis not present

## 2021-05-14 DIAGNOSIS — R55 Syncope and collapse: Secondary | ICD-10-CM | POA: Diagnosis not present

## 2021-05-14 DIAGNOSIS — N179 Acute kidney failure, unspecified: Secondary | ICD-10-CM | POA: Diagnosis not present

## 2021-05-14 DIAGNOSIS — R338 Other retention of urine: Secondary | ICD-10-CM

## 2021-05-14 DIAGNOSIS — R630 Anorexia: Secondary | ICD-10-CM

## 2021-05-14 LAB — COMPREHENSIVE METABOLIC PANEL
ALT: 45 U/L — ABNORMAL HIGH (ref 0–44)
AST: 47 U/L — ABNORMAL HIGH (ref 15–41)
Albumin: 3 g/dL — ABNORMAL LOW (ref 3.5–5.0)
Alkaline Phosphatase: 61 U/L (ref 38–126)
Anion gap: 6 (ref 5–15)
BUN: 33 mg/dL — ABNORMAL HIGH (ref 8–23)
CO2: 22 mmol/L (ref 22–32)
Calcium: 8.7 mg/dL — ABNORMAL LOW (ref 8.9–10.3)
Chloride: 110 mmol/L (ref 98–111)
Creatinine, Ser: 0.75 mg/dL (ref 0.61–1.24)
GFR, Estimated: >60 mL/min (ref >60)
Glucose, Bld: 116 mg/dL — ABNORMAL HIGH (ref 70–99)
Potassium: 3.3 mmol/L — ABNORMAL LOW (ref 3.5–5.1)
Sodium: 138 mmol/L (ref 135–145)
Total Bilirubin: 1.9 mg/dL — ABNORMAL HIGH (ref 0.3–1.2)
Total Protein: 6.3 g/dL — ABNORMAL LOW (ref 6.5–8.1)

## 2021-05-14 LAB — CBC
HCT: 39.3 % (ref 39.0–52.0)
Hemoglobin: 13.4 g/dL (ref 13.0–17.0)
MCH: 33 pg (ref 26.0–34.0)
MCHC: 34.1 g/dL (ref 30.0–36.0)
MCV: 96.8 fL (ref 80.0–100.0)
Platelets: 190 10*3/uL (ref 150–400)
RBC: 4.06 MIL/uL — ABNORMAL LOW (ref 4.22–5.81)
RDW: 12.6 % (ref 11.5–15.5)
WBC: 11.3 10*3/uL — ABNORMAL HIGH (ref 4.0–10.5)
nRBC: 0 % (ref 0.0–0.2)

## 2021-05-14 LAB — BASIC METABOLIC PANEL
Anion gap: 4 — ABNORMAL LOW (ref 5–15)
BUN: 33 mg/dL — ABNORMAL HIGH (ref 8–23)
CO2: 25 mmol/L (ref 22–32)
Calcium: 8.6 mg/dL — ABNORMAL LOW (ref 8.9–10.3)
Chloride: 111 mmol/L (ref 98–111)
Creatinine, Ser: 0.51 mg/dL — ABNORMAL LOW (ref 0.61–1.24)
GFR, Estimated: >60 mL/min (ref >60)
Glucose, Bld: 124 mg/dL — ABNORMAL HIGH (ref 70–99)
Potassium: 4.5 mmol/L (ref 3.5–5.1)
Sodium: 140 mmol/L (ref 135–145)

## 2021-05-14 MED ORDER — POTASSIUM CHLORIDE CRYS ER 20 MEQ PO TBCR: 40.0000 meq | EXTENDED_RELEASE_TABLET | ORAL | Status: DC

## 2021-05-14 MED ORDER — POTASSIUM CHLORIDE 20 MEQ PO PACK
40.0000 meq | PACK | ORAL | Status: AC
  Administered 2021-05-14 (×2): 40 meq via ORAL
  Filled 2021-05-14 (×2): qty 2

## 2021-05-14 MED ORDER — OXYCODONE HCL 5 MG PO TABS: 5.0000 mg | ORAL_TABLET | Freq: Four times a day (QID) | ORAL | Status: DC | PRN

## 2021-05-14 MED ORDER — POTASSIUM CHLORIDE IN NACL 20-0.9 MEQ/L-% IV SOLN
INTRAVENOUS | Status: DC
Start: 2021-05-14 — End: 2021-05-15
  Filled 2021-05-14 (×2): qty 1000

## 2021-05-14 NOTE — Plan of Care (Signed)
  Problem: Elimination: Goal: Will not experience complications related to bowel motility Outcome: Progressing Goal: Will not experience complications related to urinary retention Outcome: Progressing   

## 2021-05-14 NOTE — Progress Notes (Signed)
?[facility] Progress Note ? ?Patient: Evan Morales     ?ZRA:[id number]  ?DOA: 05/10/2021   ?PCP: Ria Bush, MD  ? ?  ?  ?Brief hospital course: ?This is a 76 year old male with hypertension, hyperlipidemia and glaucoma who presented to the hospital on 4/13 from [facility] facility after syncopal episode where he was found to have a blood pressure of 60/40. ?He had been at this facility for about 3 days.  He originally presented to the ED on 4/5 for weakness and was sent home with home health. ?He returned to the ED on 4/9 for increased weakness lethargy and poor oral intake at home and was then discharged from the ED to SNF. ?In the ED on 4/13, it was noted that the patient had pulled out his Foley catheter and was having gross hematuria.  The Foley catheter was reinserted. ?Creatinine in the ED was noted to be 1.76 and BUN was 4.2. ? ?Subjective:  ?Patient is confused and has no complaints.  His wife is at bedside and states that he has extremely poor oral intake. ?Assessment and Plan: ?Principal Problem: ?  Syncope ?-Possibly caused by dehydration-patient had difficulty standing and orthostatics could not be properly checked ?- The patient has received IV fluids ?- 2D echo revealed grade 2 diastolic dysfunction ? ?Active Problems: ? ?  AKI (acute kidney injury)  ?-See above regarding dehydration ?- Creatinine has improved from 1.76 to 0.75 ? ?Poor oral intake ?- Continue to encourage oral intake, dietitian consult has been placed ?- Continue IV fluids until oral intake improves ? ?  Hypokalemia ?-Potassium being replaced aggressively ? ?  Hyperbilirubinemia ?-T. bili has been persistently, mildly elevated for many years ? ?  Gross hematuria/urinary retention ?- Foley catheter has been replaced ?-  ? ?Dementia ?- Follow for behavioral disturbances ? ?Severe generalized weakness ?- He will need to return to skilled nursing ? ?DVT prophylaxis:  .SCDs Start: 05/10/21 1846 ? ?   Code Status: Full Code  ?Level of Care: Level of care: Telemetry ?Disposition Plan:  ?Status is: Inpatient ?Remains inpatient appropriate because: poor oral intake ? ?Objective: ?  ?Vitals:  ? 05/13/21 1509 05/13/21 2034 05/14/21 0657 05/14/21 1251  ?BP: 99/83 111/67 100/69 105/64  ?Pulse: 83 100 68 71  ?Resp: '18 20 15 17  '$ ?Temp: 98.8 ?F (37.1 ?C) 97.9 ?F (36.6 ?C) 98.6 ?F (37 ?C) (!) 97.3 ?F (36.3 ?C)  ?TempSrc: Oral Oral  Oral  ?SpO2: 97% 97% 96% 98%  ?Weight:   86.5 kg   ?Height:      ? ?Filed Weights  ? 05/12/21 0500 05/13/21 0500 05/14/21 0657  ?Weight: 87.9 kg 88.5 kg 86.5 kg  ? ?Exam: ?General exam: Appears comfortable  ?HEENT: PERRLA, oral mucosa moist, no sclera icterus or thrush ?Respiratory system: Clear to auscultation. Respiratory effort normal. ?Cardiovascular system: S1 & S2 heard, regular rate and rhythm ?Gastrointestinal system: Abdomen soft, non-tender, nondistended. Normal bowel sounds   ?Central nervous system: Alert and oriented only to person. No focal neurological deficits. ?Extremities: No cyanosis, clubbing or edema ?Skin: No rashes or ulcers ?Psychiatry:  Mood & affect appropriate.   ? ?Imaging and lab data was personally reviewed ? ? ? CBC: ?Recent Labs  ?Lab 05/10/21 ?1121 05/11/21 ?5456 05/12/21 ?0530 05/13/21 ?0502 05/14/21 ?2563  ?WBC 13.9* 11.1* 15.1* 15.3* 11.3*  ?NEUTROABS 12.2*  --   --   --   --   ?HGB 14.6 14.0 13.4 15.7 13.4  ?HCT 42.1 41.1 39.9  45.5 39.3  ?MCV 95.7 95.8 96.4 95.0 96.8  ?PLT 191 165 176 185 190  ? ?Basic Metabolic Panel: ?Recent Labs  ?Lab 05/10/21 ?1121 05/10/21 ?1527 05/11/21 ?1031 05/12/21 ?0530 05/13/21 ?0502 05/14/21 ?5945  ?NA 136  --  138 138 139 138  ?K 3.2*  --  3.4* 3.5 3.3* 3.3*  ?CL 104  --  108 107 107 110  ?CO2 26  --  '22 23 22 22  '$ ?GLUCOSE 138*  --  102* 103* 109* 116*  ?BUN 42*  --  21 20 28* 33*  ?CREATININE 1.76*  --  0.61 0.68 0.63 0.75  ?CALCIUM 8.8*  --  8.6* 8.4* 8.9 8.7*  ?MG  --  1.9  --  2.2  --   --   ? ?GFR: ?Estimated Creatinine  Clearance: 83.7 mL/min (by C-G formula based on SCr of 0.75 mg/dL). ? ?Scheduled Meds: ? atorvastatin  10 mg Oral QPM  ? brimonidine  1 drop Right Eye BID  ? Chlorhexidine Gluconate Cloth  6 each Topical Daily  ? clopidogrel  75 mg Oral Daily  ? dorzolamide-timolol  1 drop Right Eye BID  ? mouth rinse  15 mL Mouth Rinse BID  ?  morphine injection  1 mg Intravenous Once  ? sodium chloride flush  3 mL Intravenous Q12H  ? tamsulosin  0.4 mg Oral Daily  ? ?Continuous Infusions: ? ? LOS: 3 days  ? ?Author: ?Debbe Odea  ?05/14/2021 3:09 PM ?   ?

## 2021-05-15 ENCOUNTER — Ambulatory Visit: Payer: PPO | Admitting: Podiatrist

## 2021-05-15 DIAGNOSIS — N179 Acute kidney failure, unspecified: Secondary | ICD-10-CM | POA: Diagnosis not present

## 2021-05-15 DIAGNOSIS — R55 Syncope and collapse: Secondary | ICD-10-CM | POA: Diagnosis not present

## 2021-05-15 DIAGNOSIS — Z978 Presence of other specified devices: Secondary | ICD-10-CM | POA: Diagnosis not present

## 2021-05-15 DIAGNOSIS — R338 Other retention of urine: Secondary | ICD-10-CM | POA: Diagnosis not present

## 2021-05-15 DIAGNOSIS — E440 Moderate protein-calorie malnutrition: Secondary | ICD-10-CM | POA: Insufficient documentation

## 2021-05-15 LAB — CBC
HCT: 42 % (ref 39.0–52.0)
Hemoglobin: 14.2 g/dL (ref 13.0–17.0)
MCH: 32.6 pg (ref 26.0–34.0)
MCHC: 33.8 g/dL (ref 30.0–36.0)
MCV: 96.6 fL (ref 80.0–100.0)
Platelets: 230 10*3/uL (ref 150–400)
RBC: 4.35 MIL/uL (ref 4.22–5.81)
RDW: 12.3 % (ref 11.5–15.5)
WBC: 10.1 10*3/uL (ref 4.0–10.5)
nRBC: 0 % (ref 0.0–0.2)

## 2021-05-15 LAB — GLUCOSE, CAPILLARY: Glucose-Capillary: 118 mg/dL — ABNORMAL HIGH (ref 70–99)

## 2021-05-15 MED ORDER — ADULT MULTIVITAMIN W/MINERALS CH
1.0000 | ORAL_TABLET | Freq: Every day | ORAL | Status: DC
  Administered 2021-05-16 – 2021-05-23 (×7): 1 via ORAL
  Filled 2021-05-15 (×7): qty 1

## 2021-05-15 MED ORDER — SODIUM CHLORIDE 0.9 % IV SOLN: INTRAVENOUS | Status: DC

## 2021-05-15 MED ORDER — BOOST / RESOURCE BREEZE PO LIQD CUSTOM
1.0000 | Freq: Three times a day (TID) | ORAL | Status: DC
Start: 2021-05-15 — End: 2021-05-24
  Administered 2021-05-15 – 2021-05-23 (×21): 1 via ORAL

## 2021-05-15 MED ORDER — MEGESTROL ACETATE 400 MG/10ML PO SUSP
400.0000 mg | Freq: Two times a day (BID) | ORAL | Status: DC
  Administered 2021-05-16 – 2021-05-23 (×12): 400 mg via ORAL
  Filled 2021-05-15 (×17): qty 10

## 2021-05-15 NOTE — Care Management Important Message (Signed)
Important Message ? ?Patient Details IM Letter placed in Patients room. ?Name: Evan Morales ?MRN: [id number] ?Date of Birth: November 01, 1945 ? ? ?Medicare Important Message Given:  Yes ? ? ? ? ?Kerin Salen ?05/15/2021, 11:57 AM ?

## 2021-05-15 NOTE — Progress Notes (Signed)
Initial Nutrition Assessment ? ?DOCUMENTATION CODES:  ? ?Non-severe (moderate) malnutrition in context of chronic illness ? ?INTERVENTION:  ?- will liberalize from Heart Healthy to Regular. ?- will order Boost Breeze TID, each supplement provides 250 kcal and 9 grams of protein. ?- will order 1 tablet multivitamin with minerals daily. ?- continue to monitor for [place].  ? ? ?NUTRITION DIAGNOSIS:  ? ?Moderate Malnutrition related to chronic illness as evidenced by mild fat depletion, mild muscle depletion. ? ?GOAL:  ? ?Patient will meet greater than or equal to 90% of their needs ? ?MONITOR:  ? ?PO intake, Supplement acceptance, Labs, Weight trends ? ?REASON FOR ASSESSMENT:  ? ?Consult ?Assessment of nutrition requirement/status, Poor PO ? ?ASSESSMENT:  ? ?76 year old male with medical history of HTN, HLD, R eye blindness, CVA, degenerative arthritis, pre-diabetes, and glaucoma. He presented to the ED from [facility] after syncopal episode during which BP was 60/40. He had been at SNF x3 days after d/c from [facility] after admission for increased weakness, lethargy, and poor oral intake at home. ? ?Patient laying in bed mainly sleeping throughout RD visit. Wife at bedside and provides all information. She shares that they have been married for 15 years and that patient had a stroke while playing golf several years ago. She reports that his driver's license was taken away after that but that otherwise he has had no deficits since stroke.  ? ?She recounts admission to [facility], that he was at [facility] for ~1 week, and that he was brought here to [facility] after "something happened when they took him to the bathroom".  ? ?Unable to learn much about PO intakes prior to admission to [facility] or while he was at [facility], despite several attempts. Wife is encouraged by patient drinking a full peach Boost Breeze this AM after she told him that it was a peach milkshake. Patient greatly enjoys milkshakes.  Encouraged milkshakes to be brought in for patient. ? ?Wife shares that patient's son will be here later today. She feels it would be helpful for him to be a part of decision making as patient and his son are very close.  ? ?Weight today is 191 lb, weight on 4/13 was 200 lb, and weight on 2/15 was 206 lb. This indicates 15 lb weight loss (7.3% body weight) in the past 2 months; significant for time frame. ? ?He was not assessed by a RD during admission at [facility] earlier this month.  ? ? ?Labs reviewed; CBG: 118 mg/dl, BUN: 33 mmol/l, creatinine: 0.51 mg/dl, Ca: 8.6 mg/dl. ?Medications reviewed; 40 mEq Klor-Con x2 doses 4/17. ?IVF; NS-20 mEq IV KCl @ 75 ml/hr.  ?  ? ? ?NUTRITION - FOCUSED PHYSICAL EXAM: ? ?Flowsheet Row Most Recent Value  ?Orbital Region No depletion  ?Upper Arm Region Moderate depletion  ?Thoracic and Lumbar Region Unable to assess  ?Buccal Region Mild depletion  ?Temple Region Mild depletion  ?Clavicle Bone Region Moderate depletion  ?Clavicle and Acromion Bone Region Mild depletion  ?Scapular Bone Region Unable to assess  ?Dorsal Hand Unable to assess  [mittens]  ?Patellar Region No depletion  ?Anterior Thigh Region No depletion  ?Posterior Calf Region Mild depletion  ?Edema (RD Assessment) None  ?Hair Reviewed  ?Eyes Reviewed  ?Mouth Reviewed  Lennox Pippins dry tongue]  ?Skin Reviewed  ?Nails Unable to assess  ? ?  ? ? ?Diet Order:   ?Diet Order   ? ?       ?  Diet regular Room  service appropriate? Yes; Fluid consistency: Thin  Diet effective now       ?  ? ?  ?  ? ?  ? ? ?EDUCATION NEEDS:  ? ?Not appropriate for education at this time ? ?Skin:  Skin Assessment: Skin Integrity Issues: ?Skin Integrity Issues:: Stage II ?Stage II: bilateral buttocks ? ?Last BM:  4/15 (type 6, medium amount) ? ?Height:  ? ?Ht Readings from Last 1 Encounters:  ?05/10/21 '5\' 11"'$  (1.803 m)  ? ? ?Weight:  ? ?Wt Readings from Last 1 Encounters:  ?05/15/21 86.9 kg  ? ? ?BMI:  Body mass index is 26.72 kg/m?. ? ?Estimated  Nutritional Needs:  ?Kcal:  2000-2200 kcal ?Protein:  100-115 grams ?Fluid:  >/= 2.2 L/day ? ? ? ? ?Jarome Matin, MS, RD, LDN ?Registered Dietitian II ?Inpatient Clinical Nutrition ?RD pager # and on-call/weekend pager # available in [place]  ? ?

## 2021-05-15 NOTE — Progress Notes (Signed)
PT Cancellation Note ? ?Patient Details ?Name: Evan Morales ?MRN: [id number] ?DOB: 1945/02/11 ? ? ?Cancelled Treatment:     Per RN, pt unable to tolerate this morning.  Pt has been evaluated with rec for SNF.  Will continue to follow during his Acute Care stay. ? ? ?Nathanial Rancher ?05/15/2021, 11:31 AM ? ? ?

## 2021-05-15 NOTE — Progress Notes (Addendum)
?[facility] Progress Note ? ?Patient: Evan Morales     ?VHQ:[id number]  ?DOA: 05/10/2021   ?PCP: Ria Bush, MD  ? ?  ?  ?Brief hospital course: ?This is a 76 year old male with hypertension, hyperlipidemia and glaucoma who presented to the hospital on 4/13 from [facility] facility after syncopal episode where he was found to have a blood pressure of 60/40. ?He had been at this facility for about 3 days.  He originally presented to the ED on 4/5 for weakness and was sent home with home health. ?He returned to the ED on 4/9 for increased weakness lethargy and poor oral intake at home and was then discharged from the ED to SNF. ?In the ED on 4/13, it was noted that the patient had pulled out his Foley catheter and was having gross hematuria.  The Foley catheter was reinserted. ?Creatinine in the ED was noted to be 1.76 and BUN was 4.2. ? ?Subjective:  ?The patient is sleepy this morning and unable to answer questions. ?Assessment and Plan: ?Principal Problem: ?  Syncope ?-Possibly caused by dehydration-patient had difficulty standing and orthostatics could not be properly checked ?- 2D echo revealed grade 2 diastolic dysfunction ?-The patient is not eating or drinking, I am continuing IV fluids at 75 cc an hour while watching closely for fluid overload ? ?Active Problems: ? ?  AKI (acute kidney injury)  ?-See above regarding dehydration ?- Creatinine has improved from 1.76 to 0.75 ? ?Poor oral intake ?- Continue to encourage oral intake  ?- Continue IV fluids until oral intake improves ?-At this point, he continues to refuse to eat - his wife is stating that he refuses to open his mouth and tells her that he is not hungry ?- I will start Megace   ?- I have initiated palliative care discussions with his wife ?- I have requested palliative care consult as well ? ?  Hypokalemia ?-Potassium has been replaced ? ?  Hyperbilirubinemia ?-T. bili has been persistently, mildly elevated for  many years ? ?  Gross hematuria/urinary retention ?- Foley catheter has been replaced and hematuria resolved ? ?Dementia ?- Follow for behavioral disturbances ? ?Severe generalized weakness ?- Presented from rehab facility ? ?DVT prophylaxis:  .SCDs Start: 05/10/21 1846 ? ?  Code Status: Full Code  ?Level of Care: Level of care: Telemetry ?Disposition Plan:  ?Status is: Inpatient ?Remains inpatient appropriate because: poor oral intake- on IVF-follow-up on palliative consult ? ?Objective: ?  ?Vitals:  ? 05/14/21 2332 05/15/21 0500 05/15/21 0553 05/15/21 1222  ?BP: (!) 179/110  (!) 149/86 130/86  ?Pulse: 78  75 67  ?Resp:   16   ?Temp:   98.2 ?F (36.8 ?C) 97.7 ?F (36.5 ?C)  ?TempSrc:   Oral   ?SpO2:    97%  ?Weight:  86.9 kg    ?Height:      ? ?Filed Weights  ? 05/13/21 0500 05/14/21 0657 05/15/21 0500  ?Weight: 88.5 kg 86.5 kg 86.9 kg  ? ?Exam: ?General exam: Appears comfortable  ?HEENT: PERRLA, oral mucosa moist, no sclera icterus or thrush ?Respiratory system: Clear to auscultation. Respiratory effort normal. ?Cardiovascular system: S1 & S2 heard, regular rate and rhythm ?Gastrointestinal system: Abdomen soft, non-tender, nondistended. Normal bowel sounds   ?Extremities: No cyanosis, clubbing or edema ?Skin: No rashes or ulcers ?Psychiatry:  asleep, cannot assess ? ?Imaging and lab data was personally reviewed ? ? ? CBC: ?Recent Labs  ?Lab 05/10/21 ?1121 05/11/21 ?4132 05/12/21 ?0530 05/13/21 ?  0502 05/14/21 ?1791 05/15/21 ?0439  ?WBC 13.9* 11.1* 15.1* 15.3* 11.3* 10.1  ?NEUTROABS 12.2*  --   --   --   --   --   ?HGB 14.6 14.0 13.4 15.7 13.4 14.2  ?HCT 42.1 41.1 39.9 45.5 39.3 42.0  ?MCV 95.7 95.8 96.4 95.0 96.8 96.6  ?PLT 191 165 176 185 190 230  ? ? ?Basic Metabolic Panel: ?Recent Labs  ?Lab 05/10/21 ?1527 05/11/21 ?5056 05/12/21 ?0530 05/13/21 ?0502 05/14/21 ?0453 05/14/21 ?1559  ?NA  --  138 138 139 138 140  ?K  --  3.4* 3.5 3.3* 3.3* 4.5  ?CL  --  108 107 107 110 111  ?CO2  --  '22 23 22 22 25  '$ ?GLUCOSE  --   102* 103* 109* 116* 124*  ?BUN  --  21 20 28* 33* 33*  ?CREATININE  --  0.61 0.68 0.63 0.75 0.51*  ?CALCIUM  --  8.6* 8.4* 8.9 8.7* 8.6*  ?MG 1.9  --  2.2  --   --   --   ? ? ?GFR: ?Estimated Creatinine Clearance: 83.7 mL/min (A) (by C-G formula based on SCr of 0.51 mg/dL (L)). ? ?Scheduled Meds: ? atorvastatin  10 mg Oral QPM  ? brimonidine  1 drop Right Eye BID  ? Chlorhexidine Gluconate Cloth  6 each Topical Daily  ? clopidogrel  75 mg Oral Daily  ? dorzolamide-timolol  1 drop Right Eye BID  ? feeding supplement  1 Container Oral TID BM  ? mouth rinse  15 mL Mouth Rinse BID  ?  morphine injection  1 mg Intravenous Once  ? multivitamin with minerals  1 tablet Oral Daily  ? sodium chloride flush  3 mL Intravenous Q12H  ? tamsulosin  0.4 mg Oral Daily  ? ?Continuous Infusions: ? 0.9 % NaCl with KCl 20 mEq / L 75 mL/hr at 05/14/21 1739  ? ? ? LOS: 4 days  ? ?Author: ?Debbe Odea  ?05/15/2021 1:31 PM ?   ?

## 2021-05-15 NOTE — Progress Notes (Signed)
Patient has been sleeping on and off throughout shift. He is easily arousable, but does not open his eyes and falls back asleep easily. He takes a few sips of drink here and there and then refuses. He took a few bites of applesauce for me today but refused much more. We are unable to take mitts off due to patient pulling on foley and IV line. He was oriented to place this morning, but disoriented x4 for the rest of the day. Mouthcare performed for patient throughout shift, encouraged PO intake. Wife reports patient drank 2 ensures but did not witness. Urine appears to be cloudy with sediment, Dr. Wynelle Cleveland aware.  ?

## 2021-05-16 ENCOUNTER — Telehealth: Payer: Self-pay

## 2021-05-16 DIAGNOSIS — R55 Syncope and collapse: Secondary | ICD-10-CM | POA: Diagnosis not present

## 2021-05-16 LAB — BASIC METABOLIC PANEL
Anion gap: 4 — ABNORMAL LOW (ref 5–15)
BUN: 25 mg/dL — ABNORMAL HIGH (ref 8–23)
CO2: 22 mmol/L (ref 22–32)
Calcium: 8.4 mg/dL — ABNORMAL LOW (ref 8.9–10.3)
Chloride: 114 mmol/L — ABNORMAL HIGH (ref 98–111)
Creatinine, Ser: 0.73 mg/dL (ref 0.61–1.24)
GFR, Estimated: >60 mL/min (ref >60)
Glucose, Bld: 113 mg/dL — ABNORMAL HIGH (ref 70–99)
Potassium: 3.7 mmol/L (ref 3.5–5.1)
Sodium: 140 mmol/L (ref 135–145)

## 2021-05-16 LAB — CBC
HCT: 39.5 % (ref 39.0–52.0)
Hemoglobin: 13.3 g/dL (ref 13.0–17.0)
MCH: 32.6 pg (ref 26.0–34.0)
MCHC: 33.7 g/dL (ref 30.0–36.0)
MCV: 96.8 fL (ref 80.0–100.0)
Platelets: 218 10*3/uL (ref 150–400)
RBC: 4.08 MIL/uL — ABNORMAL LOW (ref 4.22–5.81)
RDW: 12.5 % (ref 11.5–15.5)
WBC: 8.5 10*3/uL (ref 4.0–10.5)
nRBC: 0 % (ref 0.0–0.2)

## 2021-05-16 LAB — GLUCOSE, CAPILLARY: Glucose-Capillary: 116 mg/dL — ABNORMAL HIGH (ref 70–99)

## 2021-05-16 MED ORDER — ENOXAPARIN SODIUM 40 MG/0.4ML IJ SOSY
40.0000 mg | PREFILLED_SYRINGE | INTRAMUSCULAR | Status: DC
Start: 2021-05-16 — End: 2021-05-24
  Administered 2021-05-16 – 2021-05-22 (×7): 40 mg via SUBCUTANEOUS
  Filled 2021-05-16 (×7): qty 0.4

## 2021-05-16 MED ORDER — MELATONIN 3 MG PO TABS
3.0000 mg | ORAL_TABLET | Freq: Every evening | ORAL | Status: DC | PRN
  Administered 2021-05-18 – 2021-05-21 (×2): 3 mg via ORAL
  Filled 2021-05-16 (×2): qty 1

## 2021-05-16 MED ORDER — NYSTATIN 100000 UNIT/ML MT SUSP
5.0000 mL | Freq: Four times a day (QID) | OROMUCOSAL | Status: DC
  Administered 2021-05-16 – 2021-05-23 (×22): 500000 [IU] via ORAL
  Filled 2021-05-16 (×23): qty 5

## 2021-05-16 MED ORDER — MELATONIN 3 MG PO TABS: 3.0000 mg | ORAL_TABLET | Freq: Every day | ORAL | Status: DC

## 2021-05-16 MED ORDER — CYANOCOBALAMIN 250 MCG PO TABS
250.0000 ug | ORAL_TABLET | Freq: Every day | ORAL | Status: DC
  Administered 2021-05-16 – 2021-05-23 (×7): 250 ug via ORAL
  Filled 2021-05-16 (×7): qty 1

## 2021-05-16 NOTE — Plan of Care (Signed)
  Problem: Clinical Measurements: Goal: Respiratory complications will improve Outcome: Progressing Goal: Cardiovascular complication will be avoided Outcome: Progressing   Problem: Nutrition: Goal: Adequate nutrition will be maintained Outcome: Progressing   

## 2021-05-16 NOTE — Progress Notes (Addendum)
PROGRESS NOTE    Evan Morales  ZOX:[id number] DOB: 1945/07/16 DOA: 05/10/2021 PCP: Eustaquio Boyden, MD   Brief Narrative: 76 year old with past medical history significant for hypertension, hyperlipidemia, glaucoma who presents to the hospital 4/13 from skilled nursing facility after syncopal episode, where he was found to have a blood pressure of 60/40. He had been at this facility for about 3 days.  He originally presented to the ED on 4/5 for weakness and was sent home with home health.  He returned to the ED on 4/9 for increased weakness lethargy and poor oral intake at home and was then discharged from the ED to skilled nursing facility.  He presented again 4/13 noted to be hypotensive, also he had pulled out his Foley catheter and was having gross hematuria.  A Foley catheter was reinserted.   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 2D echo grade 2 diastolic dysfunction Continue with IV fluids  FTT; Moderate malnutrition. Poor oral intake.  Patient  with poor oral intake.  Start ed on supplement Started on Megace.  Palliative care consulted for goals of care.  High risk for readmission TSH 2 weeks ago normal.  B 12: 246; started supplement.  Check ammonia and cortisol level.  Treat oral Trush  AKI: Scented with a creatinine of 1.7.  After hydration has decreased to 0.7  Hyperkalemia: Replaced Chronic hyperbilirubinemia: Follow  outpatient  Hematuria/urinary retention: Foley catheter has been replaced and hematuria resolved  Dementia: started on B12. Prior b12 low normal range out patient.  Generalized weakness: Plan to transfer to rehab when stable./   Pressure Injury 05/10/21 Buttocks Left Stage 2 -  Partial thickness loss of dermis presenting as a shallow open injury with a red, pink wound bed without slough. (Active)  05/10/21 1909  Location: Buttocks  Location Orientation: Left  Staging: Stage 2 -  Partial thickness loss of dermis presenting as a shallow open injury with a red, pink wound bed without slough.  Wound Description (Comments):   Present on Admission: Yes  Dressing Type Foam - Lift dressing to assess site every shift 05/16/21 1300     Pressure Injury 05/10/21 Buttocks Right Stage 2 -  Partial thickness loss of dermis presenting as a shallow open injury with a red, pink wound bed without slough. (Active)  05/10/21 1910  Location: Buttocks  Location Orientation: Right  Staging: Stage 2 -  Partial thickness loss of dermis presenting as a shallow open injury with a red, pink wound bed without slough.  Wound Description (Comments):   Present on Admission: Yes  Dressing Type Foam - Lift dressing to assess site every shift 05/16/21 1300     Nutrition Problem: Moderate Malnutrition Etiology: chronic illness    Signs/Symptoms: mild fat depletion, mild muscle depletion    Interventions: Boost Breeze, MVI, Liberalize Diet  Estimated body mass index is 27 kg/m as calculated from the following:   Height as of this encounter: 5\' 11"  (  1.803 m).   Weight as of this encounter: 87.8 kg.   DVT prophylaxis: Lovenox Code Status: Full code Family Communication: Wife who was at bedside.  Disposition Plan:  Status is: Inpatient Remains inpatient appropriate because: patient remain to have poor oral intake. Awaiting palliative care consultation. Patient is high risk for readmission.      Consultants:  Palliative care.   Procedures:  None  Antimicrobials:  None  Subjective: He is alert, answer questions. He has been sleepy and not eating in the hospital. Today is first day he is more alert.  He is confuse.  He is asking for something to help him sleep.    Objective: Vitals:   05/16/21 0018 05/16/21 0500 05/16/21 0520 05/16/21 1225  BP: 110/70  124/78 (!) 97/56  Pulse: 80  78 65  Resp:   20 20  Temp:   98.6 F (37 C) 98.7 F (37.1 C)  TempSrc:   Rectal Oral  SpO2:   98% 94%  Weight:  87.8 kg    Height:        Intake/Output Summary (Last 24 hours) at 05/16/2021 1432 Last data filed at 05/16/2021 1300 Gross per 24 hour  Intake 2108.75 ml  Output 1050 ml  Net 1058.75 ml   Filed Weights   05/14/21 0657 05/15/21 0500 05/16/21 0500  Weight: 86.5 kg 86.9 kg 87.8 kg    Examination:  General exam: Appears calm and comfortable  Respiratory system: Clear to auscultation. Respiratory effort normal. Cardiovascular system: S1 & S2 heard, RRR.  Gastrointestinal system: Abdomen is nondistended, soft and nontender. No organomegaly or masses felt. Normal bowel sounds heard. Central nervous system: Alert confuse.  Extremities: no edema    Data Reviewed: I have personally reviewed following labs and imaging studies  CBC: Recent Labs  Lab 05/10/21 1121 05/11/21 0506 05/12/21 0530 05/13/21 0502 05/14/21 0453 05/15/21 0439 05/16/21 0516  WBC 13.9*   < > 15.1* 15.3* 11.3* 10.1 8.5  NEUTROABS 12.2*  --   --   --   --   --   --   HGB 14.6   < > 13.4 15.7 13.4 14.2 13.3  HCT 42.1   < > 39.9 45.5 39.3 42.0 39.5  MCV 95.7   < > 96.4 95.0 96.8 96.6 96.8  PLT 191   < > 176 185 190 230 218   < > = values in this interval not displayed.   Basic Metabolic Panel: Recent Labs  Lab 05/10/21 1527 05/11/21 0506 05/12/21 0530 05/13/21 0502 05/14/21 0453 05/14/21 1559 05/16/21 0516  NA  --    < > 138 139 138 140 140  K  --    < > 3.5 3.3* 3.3* 4.5 3.7   CL  --    < > 107 107 110 111 114*  CO2  --    < > 23 22 22 25 22   GLUCOSE  --    < > 103* 109* 116* 124* 113*  BUN  --    < > 20 28* 33* 33* 25*  CREATININE  --    < > 0.68 0.63 0.75 0.51* 0.73  CALCIUM  --    < > 8.4* 8.9 8.7* 8.6* 8.4*  MG 1.9  --  2.2  --   --   --   --    < > = values in this interval not displayed.   GFR: Estimated Creatinine Clearance: 83.7 mL/min (by C-G formula based on SCr of 0.73 mg/dL). Liver  Function Tests: Recent Labs  Lab 05/10/21 1121 05/11/21 0506 05/12/21 0530 05/13/21 0502 05/14/21 0453  AST 55* 50* 39 57* 47*  ALT 28 29 26  38 45*  ALKPHOS 66 61 60 71 61  BILITOT 2.4* 2.6* 2.0* 2.1* 1.9*  PROT 6.8 6.4* 6.4* 6.8 6.3*  ALBUMIN 3.6 3.3* 3.2* 3.4* 3.0*   No results for input(s): LIPASE, AMYLASE in the last 168 hours. No results for input(s): AMMONIA in the last 168 hours. Coagulation Profile: No results for input(s): INR, PROTIME in the last 168 hours. Cardiac Enzymes: No results for input(s): CKTOTAL, CKMB, CKMBINDEX, TROPONINI in the last 168 hours. BNP (last 3 results) No results for input(s): PROBNP in the last 8760 hours. HbA1C: No results for input(s): HGBA1C in the last 72 hours. CBG: Recent Labs  Lab 05/13/21 0810 05/13/21 1216 05/13/21 1712 05/15/21 0551 05/16/21 0506  GLUCAP 103* 158* 124* 118* 116*   Lipid Profile: No results for input(s): CHOL, HDL, LDLCALC, TRIG, CHOLHDL, LDLDIRECT in the last 72 hours. Thyroid Function Tests: No results for input(s): TSH, T4TOTAL, FREET4, T3FREE, THYROIDAB in the last 72 hours. Anemia Panel: No results for input(s): VITAMINB12, FOLATE, FERRITIN, TIBC, IRON, RETICCTPCT in the last 72 hours. Sepsis Labs: No results for input(s): PROCALCITON, LATICACIDVEN in the last 168 hours.  Recent Results (from the past 240 hour(s))  MRSA Next Gen by PCR, Nasal     Status: None   Collection Time: 05/12/21  5:53 AM   Specimen: Nasal Mucosa; Nasal Swab  Result Value Ref Range Status   MRSA by  PCR Next Gen NOT DETECTED NOT DETECTED Final    Comment: (NOTE) The GeneXpert MRSA Assay (FDA approved for NASAL specimens only), is one component of a comprehensive MRSA colonization surveillance program. It is not intended to diagnose MRSA infection nor to guide or monitor treatment for MRSA infections. Test performance is not FDA approved in patients less than 34 years old. Performed at [facility], [address], [place], [place] [address]          Radiology Studies: No results found.      Scheduled Meds:  atorvastatin  10 mg Oral QPM   brimonidine  1 drop Right Eye BID   Chlorhexidine Gluconate Cloth  6 each Topical Daily   clopidogrel  75 mg Oral Daily   dorzolamide-timolol  1 drop Right Eye BID   enoxaparin (LOVENOX) injection  40 mg Subcutaneous Q24H   feeding supplement  1 Container Oral TID BM   mouth rinse  15 mL Mouth Rinse BID   megestrol  400 mg Oral BID    morphine injection  1 mg Intravenous Once   multivitamin with minerals  1 tablet Oral Daily   sodium chloride flush  3 mL Intravenous Q12H   tamsulosin  0.4 mg Oral Daily   vitamin B-12  250 mcg Oral Daily   Continuous Infusions:  sodium chloride 75 mL/hr at 05/16/21 0325     LOS: 5 days    Time spent: 35 minutes,     Shawnell Dykes A Esaul Dorwart, MD [facility]   If 7PM-7AM, please contact night-coverage [email or web address]  05/16/2021, 2:32 PM

## 2021-05-16 NOTE — Telephone Encounter (Signed)
[facility] Night - Client ?TELEPHONE ADVICE RECORD ?[facility]? ?Patient ?Name: ?Evan O'SH ?IELDS ?Gender: Male ?DOB: 1945/12/23 ?Age: 76 Y 5 M 16 D ?Return ?Phone ?Number: ?[id number] ?(Primary), ?[id number] ?(Secondary) ?Address: ?City/ ?State/ ?Zip: [place]  ? [address] ?Client [facility] Night - Client ?Client Site [facility] ?Provider Ria Bush - MD ?Contact Type Call ?Who Is Calling Patient / Member / Family / Caregiver ?Call Type Triage / Clinical ?Caller Name Evan Morales ?Relationship To Patient Spouse ?Return Phone Number [phone] (Secondary) ?Chief Complaint Unknown Complaint ?Reason for Call Request to Speak to a Physician ?Initial Comment Caller states her husband is in the hospital and ?she got home and saw several missed calls. He ?was first in [facility] but then he went to ?[place]. Something happened at that ?location, so he is now at [facility]. Secondary ?number is her cell which she will have with her at ?the hospital. ?Translation No ?Nurse Assessment ?Nurse: Kelli Churn, RN, Dustin Date/Time (Eastern Time): 05/15/2021 5:55:15 PM ?Confirm and document reason for call. If ?symptomatic, describe symptoms. ?---Caller states she had several calls talking about ?doing home therapy, states he isn't ready for that, ?getting him transferred to [facility], said husband ?was set up to go to [facility] and they called this ?morning said they cant do anything else for him. She ?wants to get him moved to [facility], Doctor stated ?he cant go today. States they haven't had him up. ?Does the patient have any new or worsening ?symptoms? ---No ?Please document clinical information provided and ?list any resource used. ?---Let her know to call back to the office in the ?morning and discuss the situation of getting him ?moved with the Doctors Nurse. ?Disp. Time (Eastern ?Time) Disposition Final  User ?05/15/2021 6:06:10 PM Clinical Call Yes Files, RN, Dusti ?

## 2021-05-16 NOTE — Progress Notes (Signed)
Pt woke up this afternoon a/o x's 3.  He understood why he had mitts on and asked if he could take them off.  He had a conversation about his health and his family.  He was able to help feed himself.  Did not bother IV or foley cath. Pt became tired and went back to sleep after eating 75% of meal. Mitts left off at this time.   ? ?Will continue to monitor.  ?

## 2021-05-16 NOTE — Plan of Care (Signed)
  Problem: Clinical Measurements: Goal: Will remain free from infection Outcome: Progressing Goal: Diagnostic test results will improve Outcome: Progressing Goal: Respiratory complications will improve Outcome: Progressing Goal: Cardiovascular complication will be avoided Outcome: Progressing   

## 2021-05-16 NOTE — Telephone Encounter (Signed)
After speaking with Dr. Danise Mina. I called the patient's wife and was advised that her husband is still in the hospital . Patient's wife stated that he is not ready to be released and does not know when he will be. Patient's wife stated when he leaves the hospital he is going to [facility]. Patient's wife requested that his appointment on 05/28/21 with Dr. Danise Mina be cancelled because she does not know when he will be able to come to the office. Patient's wife stated that she will call and schedule an appointment after he is  released from [facility]. ?Appointment cancelled. ?

## 2021-05-17 ENCOUNTER — Ambulatory Visit: Payer: PPO | Admitting: Specialist

## 2021-05-17 DIAGNOSIS — E440 Moderate protein-calorie malnutrition: Secondary | ICD-10-CM

## 2021-05-17 DIAGNOSIS — Z515 Encounter for palliative care: Secondary | ICD-10-CM | POA: Diagnosis not present

## 2021-05-17 DIAGNOSIS — R55 Syncope and collapse: Secondary | ICD-10-CM | POA: Diagnosis not present

## 2021-05-17 DIAGNOSIS — Z7189 Other specified counseling: Secondary | ICD-10-CM

## 2021-05-17 DIAGNOSIS — F0150 Vascular dementia without behavioral disturbance: Secondary | ICD-10-CM

## 2021-05-17 DIAGNOSIS — R310 Gross hematuria: Secondary | ICD-10-CM

## 2021-05-17 DIAGNOSIS — Z66 Do not resuscitate: Secondary | ICD-10-CM

## 2021-05-17 DIAGNOSIS — I6930 Unspecified sequelae of cerebral infarction: Secondary | ICD-10-CM

## 2021-05-17 DIAGNOSIS — R531 Weakness: Secondary | ICD-10-CM

## 2021-05-17 LAB — CBC
HCT: 37.9 % — ABNORMAL LOW (ref 39.0–52.0)
Hemoglobin: 13 g/dL (ref 13.0–17.0)
MCH: 32.8 pg (ref 26.0–34.0)
MCHC: 34.3 g/dL (ref 30.0–36.0)
MCV: 95.7 fL (ref 80.0–100.0)
Platelets: 235 10*3/uL (ref 150–400)
RBC: 3.96 MIL/uL — ABNORMAL LOW (ref 4.22–5.81)
RDW: 12.1 % (ref 11.5–15.5)
WBC: 10.2 10*3/uL (ref 4.0–10.5)
nRBC: 0 % (ref 0.0–0.2)

## 2021-05-17 LAB — AMMONIA: Ammonia: 26 umol/L (ref 9–35)

## 2021-05-17 LAB — CORTISOL-AM, BLOOD: Cortisol - AM: 11.3 ug/dL (ref 6.7–22.6)

## 2021-05-17 LAB — GLUCOSE, CAPILLARY: Glucose-Capillary: 120 mg/dL — ABNORMAL HIGH (ref 70–99)

## 2021-05-17 NOTE — Progress Notes (Signed)
?PROGRESS NOTE ? ? ? ?Evan Morales  HKV:[id number] DOB: [birth year] DOA: 05/10/2021 ?PCP: Ria Bush, MD  ? ?Brief Narrative: ?76 year old with past medical history significant for hypertension, hyperlipidemia, glaucoma who presents to the hospital 4/13 from skilled nursing facility after syncopal episode, where he was found to have a blood pressure of 60/40. ?He had been at this facility for about 3 days.  He originally presented to the ED on 4/5 for weakness and was sent home with home health.  He returned to the ED on 4/9 for increased weakness lethargy and poor oral intake at home and was then discharged from the ED to skilled nursing facility. ? ?He presented again 4/13 noted to be hypotensive, also he had pulled out his Foley catheter and was having gross hematuria.  A Foley catheter was reinserted. ? ? ?Assessment & Plan: ?  ?Principal Problem: ?  Syncope ?Active Problems: ?  HYPERCHOLESTEROLEMIA ?  Essential hypertension ?  History of cerebrovascular accident (CVA) with residual deficit ?  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  Hypokalemia ?  Hyperbilirubinemia ?  AKI (acute kidney injury) ([place]) ?  Gross hematuria ?  Pressure injury of skin ?  Weakness ?  Vascular dementia ([facility]) ?  Acute urinary retention ?  Chronic indwelling Foley catheter ?  Malnutrition of moderate degree ? ?Syncope: ?In the setting of dehydration, poor oral intake. ?2D echo grade 2 diastolic dysfunction ?Continue with IV fluids ? ?FTT; Moderate malnutrition. Poor oral intake.  Patient  with poor oral intake.  Start ed on supplement ?Started on Megace.  ?Palliative care consulted for goals of care. Family had initial meeting with palliative care. Plan for subsequent meeting on Saturday to determine if patient will transition to hospice care. Hospice facility.  ?High risk for readmission ?TSH 2 weeks ago normal.  ?B 12: 246; started supplement.  ?Ammonia normal  and cortisol level 11. ?Treat  oral Trush ? ?AKI: ?Admitted with a creatinine of 1.7.  After hydration has decreased to 0.7 ? ?Hyperkalemia: Replaced ?Chronic hyperbilirubinemia: Follow outpatient ? ?Hematuria/urinary retention: Foley catheter has been replaced and hematuria resolved ? ?Presume diagnosis of Dementia: started on B12. Prior b12 low normal range out patient.  ?History CVA>  ?Generalized weakness: Plan to transfer to rehab when stable./  ? ?Pressure Injury 05/10/21 Buttocks Left Stage 2 -  Partial thickness loss of dermis presenting as a shallow open injury with a red, pink wound bed without slough. (Active)  ?05/10/21 1909  ?Location: Buttocks  ?Location Orientation: Left  ?Staging: Stage 2 -  Partial thickness loss of dermis presenting as a shallow open injury with a red, pink wound bed without slough.  ?Wound Description (Comments):   ?Present on Admission: Yes  ?Dressing Type Foam - Lift dressing to assess site every shift 05/16/21 2100  ?   ?Pressure Injury 05/10/21 Buttocks Right Stage 2 -  Partial thickness loss of dermis presenting as a shallow open injury with a red, pink wound bed without slough. (Active)  ?05/10/21 1910  ?Location: Buttocks  ?Location Orientation: Right  ?Staging: Stage 2 -  Partial thickness loss of dermis presenting as a shallow open injury with a red, pink wound bed without slough.  ?Wound Description (Comments):   ?Present on Admission: Yes  ?Dressing Type Foam - Lift dressing to assess site every shift 05/16/21 2100  ? ? ? ?Nutrition Problem: Moderate Malnutrition ?Etiology: chronic illness ? ? ? ?Signs/Symptoms: mild fat depletion, mild muscle depletion ? ? ? ?  Interventions: Boost Breeze, MVI, Liberalize Diet ? ?Estimated body mass index is 25.12 kg/m? as calculated from the following: ?  Height as of this encounter: '5\' 11"'$  (1.803 m). ?  Weight as of this encounter: 81.7 kg. ? ? ?DVT prophylaxis: Lovenox ?Code Status: Full code ?Family Communication: Wife who was at bedside. And daughter in  law ?Disposition Plan:  ?Status is: Inpatient ?Remains inpatient appropriate because: patient remain to have poor oral intake. Awaiting palliative care consultation. Patient is high risk for readmission.  ? ? ? ?Consultants:  ?Palliative care.  ? ?Procedures:  ?None ? ?Antimicrobials:  ?None ? ?Subjective: ?He is alert, pleasantly confuse.  ? ? ?Objective: ?Vitals:  ? 05/17/21 0511 05/17/21 6440 05/17/21 0934 05/17/21 1155  ?BP: (!) 178/103 104/86 107/77 (!) 147/88  ?Pulse: 80 66 79 69  ?Resp: 20   18  ?Temp: 97.7 ?F (36.5 ?C)   98.1 ?F (36.7 ?C)  ?TempSrc: Oral     ?SpO2: 97%   97%  ?Weight:      ?Height:      ? ? ?Intake/Output Summary (Last 24 hours) at 05/17/2021 1541 ?Last data filed at 05/17/2021 3474 ?Gross per 24 hour  ?Intake 1966.25 ml  ?Output 2225 ml  ?Net -258.75 ml  ? ? ?Filed Weights  ? 05/15/21 0500 05/16/21 0500 05/17/21 0500  ?Weight: 86.9 kg 87.8 kg 81.7 kg  ? ? ?Examination: ? ?General exam: NAD ?Respiratory system: CTA ?Cardiovascular system: S 1, S 2 RRR ? ? ? ? ?Data Reviewed: I have personally reviewed following labs and imaging studies ? ?CBC: ?Recent Labs  ?Lab 05/13/21 ?0502 05/14/21 ?2595 05/15/21 ?0439 05/16/21 ?6387 05/17/21 ?0500  ?WBC 15.3* 11.3* 10.1 8.5 10.2  ?HGB 15.7 13.4 14.2 13.3 13.0  ?HCT 45.5 39.3 42.0 39.5 37.9*  ?MCV 95.0 96.8 96.6 96.8 95.7  ?PLT 185 190 230 218 235  ? ? ?Basic Metabolic Panel: ?Recent Labs  ?Lab 05/12/21 ?0530 05/13/21 ?0502 05/14/21 ?0453 05/14/21 ?1559 05/16/21 ?5643  ?NA 138 139 138 140 140  ?K 3.5 3.3* 3.3* 4.5 3.7  ?CL 107 107 110 111 114*  ?CO2 '23 22 22 25 22  '$ ?GLUCOSE 103* 109* 116* 124* 113*  ?BUN 20 28* 33* 33* 25*  ?CREATININE 0.68 0.63 0.75 0.51* 0.73  ?CALCIUM 8.4* 8.9 8.7* 8.6* 8.4*  ?MG 2.2  --   --   --   --   ? ? ?GFR: ?Estimated Creatinine Clearance: 83.7 mL/min (by C-G formula based on SCr of 0.73 mg/dL). ?Liver Function Tests: ?Recent Labs  ?Lab 05/11/21 ?3295 05/12/21 ?0530 05/13/21 ?0502 05/14/21 ?1884  ?AST 50* 39 57* 47*  ?ALT 29 26  38 45*  ?ALKPHOS 16 60 63 01  ?BILITOT 2.6* 2.0* 2.1* 1.9*  ?PROT 6.4* 6.4* 6.8 6.3*  ?ALBUMIN 3.3* 3.2* 3.4* 3.0*  ? ? ?No results for input(s): LIPASE, AMYLASE in the last 168 hours. ?Recent Labs  ?Lab 05/17/21 ?0500  ?AMMONIA 26  ? ?Coagulation Profile: ?No results for input(s): INR, PROTIME in the last 168 hours. ?Cardiac Enzymes: ?No results for input(s): CKTOTAL, CKMB, CKMBINDEX, TROPONINI in the last 168 hours. ?BNP (last 3 results) ?No results for input(s): PROBNP in the last 8760 hours. ?HbA1C: ?No results for input(s): HGBA1C in the last 72 hours. ?CBG: ?Recent Labs  ?Lab 05/13/21 ?1216 05/13/21 ?1712 05/15/21 ?6010 05/16/21 ?0506 05/17/21 ?0507  ?GLUCAP 158* 124* 118* 116* 120*  ? ? ?Lipid Profile: ?No results for input(s): CHOL, HDL, LDLCALC, TRIG, CHOLHDL, LDLDIRECT in the last 72  hours. ?Thyroid Function Tests: ?No results for input(s): TSH, T4TOTAL, FREET4, T3FREE, THYROIDAB in the last 72 hours. ?Anemia Panel: ?No results for input(s): VITAMINB12, FOLATE, FERRITIN, TIBC, IRON, RETICCTPCT in the last 72 hours. ?Sepsis Labs: ?No results for input(s): PROCALCITON, LATICACIDVEN in the last 168 hours. ? ?Recent Results (from the past 240 hour(s))  ?MRSA Next Gen by PCR, Nasal     Status: None  ? Collection Time: 05/12/21  5:53 AM  ? Specimen: Nasal Mucosa; Nasal Swab  ?Result Value Ref Range Status  ? MRSA by PCR Next Gen NOT DETECTED NOT DETECTED Final  ?  Comment: (NOTE) ?The GeneXpert MRSA Assay (FDA approved for NASAL specimens only), ?is one component of a comprehensive MRSA colonization surveillance ?program. It is not intended to diagnose MRSA infection nor to guide ?or monitor treatment for MRSA infections. ?Test performance is not FDA approved in patients less than 2 years ?old. ?Performed at [facility], [address] ?[place], [place] [address] ?  ? ?  ? ? ? ? ? ?Radiology Studies: ?No results found. ? ? ? ? ? ?Scheduled Meds: ? atorvastatin  10 mg Oral QPM  ? brimonidine   1 drop Right Eye BID  ? Chlorhexidine Gluconate Cloth  6 each Topical Daily  ? clopidogrel  75 mg Oral Daily  ? dorzolamide-timolol  1 drop Right Eye BID  ? enoxaparin (LOVENOX) injection  40 mg Subcutaneous Q24

## 2021-05-17 NOTE — Consult Note (Signed)
? ?Palliative Care Consult Note  ?                                ?Date: 05/17/2021  ? ?Patient Name: Evan Morales  ?DOB: 21-Aug-1945  MRN: [id number]  Age / Sex: 76 y.o., male  ?PCP: Ria Bush, MD ?Referring Physician: Elmarie Shiley, MD ? ?Reason for Consultation: Establishing goals of care ? ?HPI/Patient Profile: 76 y.o. male  with past medical history of HLD, glaucoma, HTN, CVA (5 years ago) who presented from outside her syncopal events.  He was noted to be hypotensive (60/40) and given a fluid bolus and brought to the ED for evaluation.  Noted poor appetite prior to arrival.  He was admitted on 05/10/2021 with syncope, malnutrition, pulm infiltrate, AKI, generalized weakness, and others.  ? ?Patient is noted since third ED and/or admission in the past 2 weeks. ? ?PMT was consulted for goals of care conversation. ? ?Past Medical History:  ?Diagnosis Date  ?? Blind right eye   ? trauma as child - no longer driving.  ?? CVA (cerebral vascular accident) ([place]) 03/27/2012  ? left occipital lobe - acute or subacute infarction. No hemorrhage. Residual numbness R index and middle finger and R periorally, residual memory loss, worsened L vision  ?? Degenerative arthritis   ?? Dyslipidemia   ?? Dysthymia   ?? History of colon polyps 05/15/2005  ? tubular adenomas polyps  ?? Hypertension   ?? Legally blind   ?? Pre-diabetes   ? ? ?Subjective:  ? ?This NP Walden Field reviewed medical records, received report from team, assessed the patient and then meet at the patient's bedside to discuss diagnosis, prognosis, GOC, EOL wishes disposition and options. ? ?I met with the patient and his wife Verdis Frederickson at the bedside.  Spoke extensively with the wife's daughter-in-law (from previous marriage), in the conference room. ?  ?Concept of Palliative Care was introduced as specialized medical care for people and their families living with serious illness.  If focuses on  providing relief from the symptoms and stress of a serious illness.  The goal is to improve quality of life for both the patient and the family. Values and goals of care important to patient and family were attempted to be elicited. ? ?Created space and opportunity for patient  and family to explore thoughts and feelings regarding current medical situation ?  ?Natural trajectory and current clinical status were discussed. Questions and concerns addressed. Patient  encouraged to call with questions or concerns.   ? ?Patient/Family Understanding of Illness: ?Extensive CVA 5 years ago and since then has been throwing himself to die".  He states when he was 74 he told his son that he is going to die of 55, when he was 84 years and 36.  She notes that the past several months have been difficult with progressive weakness.  In the past couple months he only has about out of the chair to use the bathroom.  He is weak and unstable and relies on his wife (who is 1 years old) to help him when he falls or to help him get out of out of the chair.  She notes that he is wearing her down.  Family is extensively told her to not help get him off the ground for help him to stand and.  He is unable to get up after a fall to call 9 1 ? ?We had  further discussion about his current clinical status including syncope, failure to thrive/ ?Nutrition, AKI, dementia.  He notes that he is not very interactive today. ? ?Life Review: ?The patient and his wife and they are both years.  They were previously married.  The patient has a son from previous marriage who lives in [place].  Patient and his wife currently live in the client. ? ?Patient Values: ?Wife's presents ? ?Goals: ?Daughter-in-law's goal for family meeting today's blood thinners.  She is concerned that patient and his wife (who has had recent cognitive decline over the past 1 to 1-1/2 years due to significant stress) will have difficulty understanding complex discussions.  Her  goal is to have a serious goals of care conversation with all family to try to counsel concerns about how to best care for the patient. ? ?Today's Discussion: ?In addition to life review and clinical information described above we had quite an extensive conversation.  The patient's daughter-in-law Reino Bellis) notes that the patient's wife has had cognitive decline for the past 1-1/2 to 2 years.  Recent stresses have significantly accelerated this.  The patient's wife [name]) lost her daughter about 1 year ago.  Additionally her sister is on hospice and is not expected to survive the day.  Now that her husband has increasing physical medical needs it is wearing her out. ? ?Harriette states that she feels the patient wants to die and is ready to die. He is not eating much.He has apparently communicated this to family before. ? ?We discussed at this point it is best to schedule a family meeting so we can have an open and honest discussion about the patient's current clinical status, recent decline, and options moving forward with intent to focus on his goals/wishes. We called the patient's son Saralyn Pilar and, based on schedules/availability, have planned for a family meeting Saturday 4/22 at 3:00 pm with the patient's wife [name], Wife's daughter in law [place], and the patient's son Saralyn Pilar. Will will make attempts to be simple and blunt about the situation to promote understanding of his situation. ? ?I also introduced the hospice philosophy and places of service for hospice care (home, long-term care facility, residential hospice).  I explained that hospice is a Medicare benefit but if the decision were to be for long-term care hospice this would need Medicaid or out-of-pocket payment for the LTC facility.Marland Kitchen  Seems on board with the hospice philosophy.  We will plan to discuss further after family meeting with all family present. ? ?I provided emotional and general support through therapeutic listening, empathy, sharing  of stories, and other techniques.  I answered all questions and addressed all concerns to the best of my ability. ? ?ROS Limited due to AMS: ?Review of Systems  ?Constitutional:   ?     Denies pain in general  ? ?Objective:  ? ?Primary Diagnoses: ?Present on Admission: ?? Syncope ?? Essential hypertension ?? HYPERCHOLESTEROLEMIA ?? 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 ? ?Physical Exam ?Vitals and nursing note reviewed.  ?Constitutional:   ?   General: He is sleeping. He is not in acute distress. ?   Appearance: He is ill-appearing.  ?HENT:  ?   Head: Normocephalic and atraumatic.  ?Cardiovascular:  ?   Rate and Rhythm: Normal rate.  ?Pulmonary:  ?   Effort: Pulmonary effort is normal. No respiratory distress.  ?Abdominal:  ?   General: Abdomen is flat.  ?   Palpations: Abdomen is soft.  ?  Skin: ?   General: Skin is warm and dry.  ?Neurological:  ?   Mental Status: He is lethargic.  ?   Comments: Oriented x 2  ? ? ?Vital Signs:  ?BP (!) 147/88 (BP Location: Right Arm)   Pulse 69   Temp 98.1 ?F (36.7 ?C)   Resp 18   Ht 5' 11" (1.803 m)   Wt 81.7 kg   SpO2 97%   BMI 25.12 kg/m?  ? ?Palliative Assessment/Data: 20-30% ? ? ? ?Advanced Care Planning:  ? ?Primary Decision Maker: ?NEXT OF KIN ? ?Code Status/Advance Care Planning: ?DNR ? ?A discussion was had today regarding advanced directives. Concepts specific to code status, artifical feeding and hydration, continued IV antibiotics and rehospitalization was had.  The difference between a aggressive medical intervention path and a palliative comfort care path for this patient at this time was had. ? ?Decisions/Changes to ACP: ?None today ? ?Assessment & Plan:  ? ?Impression: ?76 year old male with multiple comorbidities and recent significant decline including weakness, unable to ambulate on his own, frequent falls, multiple ER visits/hospitalizations in the past month.  The patient's wife who is his primary caregiver has also had  significant cognitive decline and is having more difficulty caring for him.  At this point it appears he may be approaching end-of-life.  I feel he is hospice appropriate at this time.  However, we will plan to meet w

## 2021-05-18 DIAGNOSIS — Z7189 Other specified counseling: Secondary | ICD-10-CM | POA: Diagnosis not present

## 2021-05-18 DIAGNOSIS — R55 Syncope and collapse: Secondary | ICD-10-CM | POA: Diagnosis not present

## 2021-05-18 DIAGNOSIS — Z515 Encounter for palliative care: Secondary | ICD-10-CM | POA: Diagnosis not present

## 2021-05-18 DIAGNOSIS — Z66 Do not resuscitate: Secondary | ICD-10-CM | POA: Diagnosis not present

## 2021-05-18 LAB — GLUCOSE, CAPILLARY: Glucose-Capillary: 99 mg/dL (ref 70–99)

## 2021-05-18 NOTE — Progress Notes (Signed)
Physical Therapy Treatment ?Patient Details ?Name: Evan Morales ?MRN: [id number] ?DOB: July 27, 1945 ?Today's Date: 05/18/2021 ? ? ?History of Present Illness Pt is 76 yo male admitted 05/10/21 from SNF after syncopal episode (wife reports poor oral intake).  Pt also pulled out foley catheter with gross hematuria. Pt with multiple recent hospital visits 4/5 in ED and sent home then 4/9 in ED , seen by PT and went SNF from ER. Pt has hx of HLD, glaucoma, HTN, CVA, and likely vascular dementia per H and P. ? ?  ?PT Comments  ? ? Patient making steady progress and more agreeable and pleasant today with mobility. Patient followed simple one step commands ~75% of the time with extra time and repetition. He has difficulty initiating due to weakness. Max+2 to sit up to EOB and Mod +2 assist to stand and pivot to [facility] and then amb short steps to recliner. Recliner seat elevated and discussed transfer technique with RN/NT to use 2+ with RW or Stedy for return to bed. Encouraged to have pt up for 1 hour but not too long to prevent fatigue. He will benefit from skilled PT at SNF for rehab to progress mobility. Acute PT will continue to progress as able. ? ? ?  ?Recommendations for follow up therapy are one component of a multi-disciplinary discharge planning process, led by the attending physician.  Recommendations may be updated based on patient status, additional functional criteria and insurance authorization. ? ?Follow Up Recommendations ? Skilled nursing-short term rehab (<3 hours/day) ?  ?  ?Assistance Recommended at Discharge Frequent or constant Supervision/Assistance  ?Patient can return home with the following Two people to help with walking and/or transfers;Two people to help with bathing/dressing/bathroom;Assistance with cooking/housework;Assistance with feeding;Direct supervision/assist for medications management;Direct supervision/assist for financial management ?  ?Equipment Recommendations ? BSC/3in1;Wheelchair  (measurements PT);Wheelchair cushion (measurements PT);Hospital bed  ?  ?Recommendations for Other Services   ? ? ?  ?Precautions / Restrictions Precautions ?Precautions: Fall ?Restrictions ?Weight Bearing Restrictions: No  ?  ? ?Mobility ? Bed Mobility ?Overal bed mobility: Needs Assistance ?Bed Mobility: Supine to Sit ?  ?  ?Supine to sit: Max assist, +2 for safety/equipment, +2 for physical assistance, HOB elevated ?  ?  ?General bed mobility comments: +2 Max assist with verbal/tactile cues to walk LE's off EOB, pt required assist to initiate and to reach for therapists support. Pt able to pull up some with Lt UE to initiate raising trunk but +2 max needed to complete. Pt attempted to scoot to EOB with bil UE press up, bed pad with max assist to scoot fully. ?  ? ?Transfers ?Overall transfer level: Needs assistance ?Equipment used: Rolling walker (2 wheels), 2 person hand held assist ?Transfers: Sit to/from Stand, Bed to chair/wheelchair/BSC ?Sit to Stand: Mod assist, +2 physical assistance, +2 safety/equipment, From elevated surface ?Stand pivot transfers: Mod assist, +2 physical assistance, +2 safety/equipment, From elevated surface ?Step pivot transfers: Mod assist, +2 safety/equipment, +2 physical assistance, From elevated surface ?  ?  ?  ?General transfer comment: EOB elevated and repeated cues for hand placement to power up. Pt had improved initiation with 1 UE back on bed and 1 UE on RW to rise. Mod +2 to stand and pivot to [facility], pt taking small shuffled steps to scoot LE's toward commode. Pt then turning back to EOB for seated rest and transfer with Mod+2 assist to recliner (height elevated) ?  ? ?Ambulation/Gait ?Ambulation/Gait assistance: Mod assist, +2 physical assistance ?Gait Distance (Feet): 5  Feet ?Assistive device: Rolling walker (2 wheels) ?Gait Pattern/deviations: Step-to pattern, Decreased stride length, Knee flexed in stance - right, Knee flexed in stance - left, Shuffle, Trunk flexed, Narrow  base of support ?Gait velocity: Decreased ?  ?  ?General Gait Details: pt took small shuffled forward steps with RW and Mod+2 to steady to ambulate forward from [facility] prior to turn to sit EOB. ? ? ?Stairs ?  ?  ?  ?  ?  ? ? ?Wheelchair Mobility ?  ? ?Modified Rankin (Stroke Patients Only) ?  ? ? ?  ?Balance Overall balance assessment: Needs assistance ?Sitting-balance support: Feet supported ?Sitting balance-Leahy Scale: Fair ?Sitting balance - Comments: min guard for safety with bil UE support ?  ?Standing balance support: Bilateral upper extremity supported ?Standing balance-Leahy Scale: Zero ?Standing balance comment: Mod +2 with RW ?  ?  ?  ?  ?  ?  ?  ?  ?  ?  ?  ?  ? ?  ?Cognition Arousal/Alertness: Awake/alert ?Behavior During Therapy: Flat affect ?Overall Cognitive Status: Impaired/Different from baseline ?  ?  ?  ?  ?  ?  ?  ?  ?  ?  ?  ?  ?  ?  ?  ?  ?General Comments: pt less agitated today and followed simple one step commands with extra time and some repetition. pleasanta nd stating "it feels good" when asked how sitting up felt. pt with trouble remembering he is in hospital and why. ?  ?  ? ?  ?Exercises   ? ?  ?General Comments   ?  ?  ? ?Pertinent Vitals/Pain Pain Assessment ?Pain Assessment: PAINAD ?Breathing: normal ?Negative Vocalization: none ?Facial Expression: smiling or inexpressive ?Body Language: relaxed ?Consolability: no need to console ?PAINAD Score: 0 ?Pain Intervention(s): Monitored during session, Repositioned  ? ? ?Home Living   ?  ?  ?  ?  ?  ?  ?  ?  ?  ?   ?  ?Prior Function    ?  ?  ?   ? ?PT Goals (current goals can now be found in the care plan section) Acute Rehab PT Goals ?Patient Stated Goal: family agreeable for return to SNF ?PT Goal Formulation: With patient/family ?Time For Goal Achievement: 05/25/21 ?Potential to Achieve Goals: Fair ?Progress towards PT goals: Progressing toward goals ? ?  ?Frequency ? ? ? Min 2X/week ? ? ? ?  ?PT Plan Current plan remains appropriate   ? ? ?Co-evaluation   ?  ?  ?  ?  ? ?  ?AM-PAC PT "6 Clicks" Mobility   ?Outcome Measure ? Help needed turning from your back to your side while in a flat bed without using bedrails?: Total ?Help needed moving from lying on your back to sitting on the side of a flat bed without using bedrails?: Total ?Help needed moving to and from a bed to a chair (including a wheelchair)?: Total ?Help needed standing up from a chair using your arms (e.g., wheelchair or bedside chair)?: Total ?Help needed to walk in hospital room?: Total ?Help needed climbing 3-5 steps with a railing? : Total ?6 Click Score: 6 ? ?  ?End of Session Equipment Utilized During Treatment: Gait belt ?Activity Tolerance: Treatment limited secondary to agitation ?Patient left: with call bell/phone within reach;with family/visitor present;with nursing/sitter in room;in chair ?Nurse Communication: Mobility status ?PT Visit Diagnosis: Unsteadiness on feet (R26.81);Muscle weakness (generalized) (M62.81);Difficulty in walking, not elsewhere classified (R26.2) ?  ? ? ?  Time: 0932-3557 ?PT Time Calculation (min) (ACUTE ONLY): 27 min ? ?Charges:  $Therapeutic Activity: 23-37 mins          ?          ? ?Gwynneth Albright PT, DPT ?Acute Rehabilitation Services ?Office [phone] ?Pager [phone]  ? ? ?Jacques Navy ?05/18/2021, 10:57 AM ? ?

## 2021-05-18 NOTE — Progress Notes (Signed)
?PROGRESS NOTE ? ? ? ?Evan Morales  PQZ:[id number] DOB: 17-Apr-1945 DOA: 05/10/2021 ?PCP: Ria Bush, MD  ? ?Brief Narrative: ?76 year old with past medical history significant for hypertension, hyperlipidemia, glaucoma who presents to the hospital 4/13 from skilled nursing facility after syncopal episode, where he was found to have a blood pressure of 60/40. ?He had been at this facility for about 3 days.  He originally presented to the ED on 4/5 for weakness and was sent home with home health.  He returned to the ED on 4/9 for increased weakness lethargy and poor oral intake at home and was then discharged from the ED to skilled nursing facility. ? ?He presented again 4/13 noted to be hypotensive, also he had pulled out his Foley catheter and was having gross hematuria.  A Foley catheter was reinserted. ? ? ?Assessment & Plan: ?  ?Principal Problem: ?  Syncope ?Active Problems: ?  HYPERCHOLESTEROLEMIA ?  Essential hypertension ?  History of cerebrovascular accident (CVA) with residual deficit ?  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  Hypokalemia ?  Hyperbilirubinemia ?  AKI (acute kidney injury) ([place]) ?  Gross hematuria ?  Pressure injury of skin ?  Weakness ?  Vascular dementia ([place]) ?  Acute urinary retention ?  Chronic indwelling Foley catheter ?  Malnutrition of moderate degree ? ?Syncope: ?In the setting of dehydration, poor oral intake. ?2D echo grade 2 diastolic dysfunction ?DC fluids, and monitor oral intake.  ? ?FTT; Moderate malnutrition. Poor oral intake.  Patient  with poor oral intake.  Start ed on supplement ?Started on Megace.  ?Palliative care consulted for goals of care. Family had initial meeting with palliative care. Plan for subsequent meeting on Saturday to determine if patient will transition to hospice care. Hospice facility.  ?High risk for readmission ?TSH 2 weeks ago normal.  ?B 12: 246; started supplement.  ?Ammonia normal  and cortisol level  11. ?Treat oral Trush ? ?AKI: ?Admitted with a creatinine of 1.7.  After hydration has decreased to 0.7 ? ?Hyperkalemia: Replaced ?Chronic hyperbilirubinemia: Follow outpatient ? ?Hematuria/urinary retention: Foley catheter has been replaced and hematuria resolved ? ?Presume diagnosis of Dementia: started on B12. Prior b12 low normal range out patient.  ?History CVA>  ?Generalized weakness: Plan to transfer to rehab when stable./  ? ?Pressure Injury 05/10/21 Buttocks Left Stage 2 -  Partial thickness loss of dermis presenting as a shallow open injury with a red, pink wound bed without slough. (Active)  ?05/10/21 1909  ?Location: Buttocks  ?Location Orientation: Left  ?Staging: Stage 2 -  Partial thickness loss of dermis presenting as a shallow open injury with a red, pink wound bed without slough.  ?Wound Description (Comments):   ?Present on Admission: Yes  ?Dressing Type Foam - Lift dressing to assess site every shift 05/17/21 2215  ?   ?Pressure Injury 05/10/21 Buttocks Right Stage 2 -  Partial thickness loss of dermis presenting as a shallow open injury with a red, pink wound bed without slough. (Active)  ?05/10/21 1910  ?Location: Buttocks  ?Location Orientation: Right  ?Staging: Stage 2 -  Partial thickness loss of dermis presenting as a shallow open injury with a red, pink wound bed without slough.  ?Wound Description (Comments):   ?Present on Admission: Yes  ?Dressing Type Foam - Lift dressing to assess site every shift 05/17/21 2215  ? ? ? ?Nutrition Problem: Moderate Malnutrition ?Etiology: chronic illness ? ? ? ?Signs/Symptoms: mild fat depletion,  mild muscle depletion ? ? ? ?Interventions: Boost Breeze, MVI, Liberalize Diet ? ?Estimated body mass index is 26.84 kg/m? as calculated from the following: ?  Height as of this encounter: '5\' 11"'$  (1.803 m). ?  Weight as of this encounter: 87.3 kg. ? ? ?DVT prophylaxis: Lovenox ?Code Status: Full code ?Family Communication: Wife who was at bedside. And daughter  in law 4/20. ?Disposition Plan:  ?Status is: Inpatient ?Remains inpatient appropriate because: patient remain to have poor oral intake. Awaiting palliative care consultation. Patient is high risk for readmission.  ? ? ? ?Consultants:  ?Palliative care.  ? ?Procedures:  ?None ? ?Antimicrobials:  ?None ? ?Subjective: ?No new complaints. He was alert, wife said he ate half banana, and one piece of bacon.  ? ? ?Objective: ?Vitals:  ? 05/17/21 1155 05/17/21 2123 05/18/21 0439 05/18/21 0442  ?BP: (!) 147/88 128/84 (!) 157/83   ?Pulse: 69 69 72   ?Resp: '18 18 18   '$ ?Temp: 98.1 ?F (36.7 ?C) 98.4 ?F (36.9 ?C) 98.4 ?F (36.9 ?C)   ?TempSrc:  Oral Oral   ?SpO2: 97% 97% 95%   ?Weight:    87.3 kg  ?Height:    '5\' 11"'$  (1.803 m)  ? ? ?Intake/Output Summary (Last 24 hours) at 05/18/2021 1415 ?Last data filed at 05/18/2021 1200 ?Gross per 24 hour  ?Intake 2818.75 ml  ?Output 1575 ml  ?Net 1243.75 ml  ? ? ?Filed Weights  ? 05/16/21 0500 05/17/21 0500 05/18/21 0442  ?Weight: 87.8 kg 81.7 kg 87.3 kg  ? ? ?Examination: ? ?General exam: NAD ?Respiratory system: CTA ?Cardiovascular system: S 1, S 2 RRR ? ? ? ? ?Data Reviewed: I have personally reviewed following labs and imaging studies ? ?CBC: ?Recent Labs  ?Lab 05/13/21 ?0502 05/14/21 ?2536 05/15/21 ?0439 05/16/21 ?6440 05/17/21 ?0500  ?WBC 15.3* 11.3* 10.1 8.5 10.2  ?HGB 15.7 13.4 14.2 13.3 13.0  ?HCT 45.5 39.3 42.0 39.5 37.9*  ?MCV 95.0 96.8 96.6 96.8 95.7  ?PLT 185 190 230 218 235  ? ? ?Basic Metabolic Panel: ?Recent Labs  ?Lab 05/12/21 ?0530 05/13/21 ?0502 05/14/21 ?0453 05/14/21 ?1559 05/16/21 ?3474  ?NA 138 139 138 140 140  ?K 3.5 3.3* 3.3* 4.5 3.7  ?CL 107 107 110 111 114*  ?CO2 '23 22 22 25 22  '$ ?GLUCOSE 103* 109* 116* 124* 113*  ?BUN 20 28* 33* 33* 25*  ?CREATININE 0.68 0.63 0.75 0.51* 0.73  ?CALCIUM 8.4* 8.9 8.7* 8.6* 8.4*  ?MG 2.2  --   --   --   --   ? ? ?GFR: ?Estimated Creatinine Clearance: 83.7 mL/min (by C-G formula based on SCr of 0.73 mg/dL). ?Liver Function Tests: ?Recent  Labs  ?Lab 05/12/21 ?0530 05/13/21 ?0502 05/14/21 ?2595  ?AST 39 57* 47*  ?ALT 26 38 45*  ?ALKPHOS 63 87 56  ?BILITOT 2.0* 2.1* 1.9*  ?PROT 6.4* 6.8 6.3*  ?ALBUMIN 3.2* 3.4* 3.0*  ? ? ?No results for input(s): LIPASE, AMYLASE in the last 168 hours. ?Recent Labs  ?Lab 05/17/21 ?0500  ?AMMONIA 26  ? ? ?Coagulation Profile: ?No results for input(s): INR, PROTIME in the last 168 hours. ?Cardiac Enzymes: ?No results for input(s): CKTOTAL, CKMB, CKMBINDEX, TROPONINI in the last 168 hours. ?BNP (last 3 results) ?No results for input(s): PROBNP in the last 8760 hours. ?HbA1C: ?No results for input(s): HGBA1C in the last 72 hours. ?CBG: ?Recent Labs  ?Lab 05/13/21 ?1712 05/15/21 ?4332 05/16/21 ?0506 05/17/21 ?0507 05/18/21 ?9518  ?GLUCAP 124* 118* 116* 120* 99  ? ? ?  Lipid Profile: ?No results for input(s): CHOL, HDL, LDLCALC, TRIG, CHOLHDL, LDLDIRECT in the last 72 hours. ?Thyroid Function Tests: ?No results for input(s): TSH, T4TOTAL, FREET4, T3FREE, THYROIDAB in the last 72 hours. ?Anemia Panel: ?No results for input(s): VITAMINB12, FOLATE, FERRITIN, TIBC, IRON, RETICCTPCT in the last 72 hours. ?Sepsis Labs: ?No results for input(s): PROCALCITON, LATICACIDVEN in the last 168 hours. ? ?Recent Results (from the past 240 hour(s))  ?MRSA Next Gen by PCR, Nasal     Status: None  ? Collection Time: 05/12/21  5:53 AM  ? Specimen: Nasal Mucosa; Nasal Swab  ?Result Value Ref Range Status  ? MRSA by PCR Next Gen NOT DETECTED NOT DETECTED Final  ?  Comment: (NOTE) ?The GeneXpert MRSA Assay (FDA approved for NASAL specimens only), ?is one component of a comprehensive MRSA colonization surveillance ?program. It is not intended to diagnose MRSA infection nor to guide ?or monitor treatment for MRSA infections. ?Test performance is not FDA approved in patients less than 2 years ?old. ?Performed at [facility], [address] ?[place], [place] [address] ?  ? ?  ? ? ? ? ? ?Radiology Studies: ?No results  found. ? ? ? ? ? ?Scheduled Meds: ? atorvastatin  10 mg Oral QPM  ? brimonidine  1 drop Right Eye BID  ? Chlorhexidine Gluconate Cloth  6 each Topical Daily  ? clopidogrel  75 mg Oral Daily  ? dorzolamide-timolol  1 dro

## 2021-05-18 NOTE — Plan of Care (Signed)

## 2021-05-18 NOTE — Progress Notes (Signed)
?Daily Progress Note  ? ?Patient Name: Evan Morales       Date: 05/18/2021 ?DOB: [birth year]  Age: 76 y.o. MRN#: [id number] ?Attending Physician: Elmarie Shiley, MD ?Primary Care Physician: Ria Bush, MD ?Admit Date: 05/10/2021 ?Length of Stay: 7 days ? ?Reason for Consultation/Follow-up: Establishing goals of care ? ?HPI/Patient Profile:  77 y.o. male  with past medical history of HLD, glaucoma, HTN, CVA (5 years ago) who presented from outside her syncopal events.  He was noted to be hypotensive (60/40) and given a fluid bolus and brought to the ED for evaluation.  Noted poor appetite prior to arrival.  He was admitted on 05/10/2021 with syncope, malnutrition, pulm infiltrate, AKI, generalized weakness, and others.  ?  ?Patient is noted since third ED and/or admission in the past 2 weeks. ?  ?PMT was consulted for goals of care conversation. ? ?Subjective:  ? ?Subjective: ?Chart Reviewed. Updates received. Patient Assessed. Created space and opportunity for patient  and family to explore thoughts and feelings regarding current medical situation. ? ?Today's Discussion: Today I met with the patient and his wife at the bedside.  Patient is sitting up in the chair.  He appears to be much more interactive today.  He does answer questions appropriately.  He states feeling pain is having is in his back because of the bed in the chair.  Denies any shortness of breath, nausea, vomiting.  When asked how he feels he is doing compared to yesterday he states "marginally better."  His wife seems happy that he is more awake.  She shares that he is eating more today, about 50% of his tray and shows me the plate.  I encouraged him to continue trying to eat.  I reminded them that we have a plan for a family meeting tomorrow at 3:00 with the patient, his wife, son, daughter-in-law.  They verbalized understanding and agree they knew about the meeting. ? ?Spoke with nursing and she shares that he is much more awake at  this point.  However, she shares that this has been his pattern for the past couple days where he will have a brief window of time where he is awake and interactive and then he will become more withdrawn, noninteractive, confused requiring mitts to prevent him from pulling at lines. ? ?I provided emotional general support through therapeutic listening, empathy, and other techniques.  Answered all questions addressed all concerns to the best my ability. ? ?Review of Systems  ?Constitutional:  Positive for fatigue.  ?     Denies pain in general  ?Respiratory:  Negative for cough and shortness of breath.   ?Gastrointestinal:  Negative for abdominal pain, nausea and vomiting.  ? ?Objective:  ? ?Vital Signs:  ?BP (!) 157/83 (BP Location: Right Arm) Comment: a bit restless in bed  Pulse 72   Temp 98.4 ?F (36.9 ?C) (Oral)   Resp 18   Ht '5\' 11"'  (1.803 m)   Wt 87.3 kg   SpO2 95%   BMI 26.84 kg/m?  ? ?Physical Exam: ?Physical Exam ?Vitals and nursing note reviewed.  ?Constitutional:   ?   General: He is not in acute distress. ?   Appearance: He is ill-appearing.  ?HENT:  ?   Head: Normocephalic and atraumatic.  ?Cardiovascular:  ?   Rate and Rhythm: Normal rate.  ?Pulmonary:  ?   Effort: Pulmonary effort is normal. No respiratory distress.  ?Abdominal:  ?   General: Abdomen is flat.  ?  Palpations: Abdomen is soft.  ?Skin: ?   General: Skin is warm and dry.  ?Neurological:  ?   Mental Status: He is alert.  ?Psychiatric:     ?   Mood and Affect: Mood normal.     ?   Behavior: Behavior normal.  ? ? ?Palliative Assessment/Data: 50% ? ? ?Assessment & Plan:  ? ?Impression: ?Present on Admission: ? Syncope ? Essential hypertension ? HYPERCHOLESTEROLEMIA ? 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 ?76 year old male with multiple comorbidities and recent significant decline including weakness, unable to ambulate on his own, frequent falls, multiple ER visits/hospitalizations in the past month.   The patient's wife who is his primary caregiver has also had significant cognitive decline and is having more difficulty caring for him.  At this point it appears he may be approaching end-of-life. Today he is much more interactive, but nursing she feels that progresses. he typically is expected for couple hours and then he regresses.  Regardless, it is unlikely that his wife will be able to adequately care for him at home and is safe situation.  He will remain at high risk for frequent falls, frequent readmissions, and declining quality of life.  I feel he is hospice appropriate at this time given his chronic illnesses, high risk for recurrent falls/injury, readmissions.  However, we will plan to meet with the family tomorrow at 3:00pm to discuss all of this and try to make decisions for how to best move forward and care for him.  If they do elect hospice the daughter-in-law does not feel that he could be adequately cared for at home and I seem to agree. However, he may not be residential hospice appropriate at this point.  Overall prognosis is poor. ? ?SUMMARY OF RECOMMENDATIONS   ?Remain DNR ?Continue to treat the treatable ?Plan for family meeting Saturday at 3:00 PM ?Further recommendations will follow family meeting ?PMT will continue to follow ? ?Symptom Management:  ?Per primary team ?PMT is available to assist as needed ? ?Code Status: DNR ? ?Prognosis: Unable to determine ? ?Discharge Planning: To Be Determined ? ?Discussed with: Medical team, nursing team, patient, patient's family ? ?Thank you for allowing [place] to participate in the care of Evan Morales ?PMT will continue to support holistically. ? ?Billing based on MDM: High ? ?Problems Addressed: One acute or chronic illness or injury that poses a threat to life or bodily function ? ?Amount and/or Complexity of Data: Category 3:Discussion of management or test interpretation with external physician/other qualified health care  professional/appropriate source (not separately reported) ? ?Risks: Decision not to resuscitate or to de-escalate care because of poor prognosis ? ? ?Walden Field, NP ?Palliative Medicine Team ? ?Team Phone # [phone] (Nights/Weekends) ? 09/26/2020, 8:17 AM  ? ?

## 2021-05-18 NOTE — Care Management Important Message (Signed)
Important Message ? ?Patient Details IM Letter placed in Patients room. ?Name: Evan Morales ?MRN: [id number] ?Date of Birth: February 05, 1945 ? ? ?Medicare Important Message Given:  Yes ? ? ? ? ?Kerin Salen ?05/18/2021, 1:23 PM ?

## 2021-05-19 ENCOUNTER — Inpatient Hospital Stay (HOSPITAL_COMMUNITY): Payer: PPO

## 2021-05-19 DIAGNOSIS — R55 Syncope and collapse: Secondary | ICD-10-CM | POA: Diagnosis not present

## 2021-05-19 DIAGNOSIS — F0150 Vascular dementia without behavioral disturbance: Secondary | ICD-10-CM | POA: Diagnosis not present

## 2021-05-19 DIAGNOSIS — E440 Moderate protein-calorie malnutrition: Secondary | ICD-10-CM | POA: Diagnosis not present

## 2021-05-19 DIAGNOSIS — Z7189 Other specified counseling: Secondary | ICD-10-CM | POA: Diagnosis not present

## 2021-05-19 LAB — URINALYSIS, ROUTINE W REFLEX MICROSCOPIC
Bilirubin Urine: NEGATIVE
Glucose, UA: NEGATIVE mg/dL
Hgb urine dipstick: NEGATIVE
Ketones, ur: NEGATIVE mg/dL
Nitrite: NEGATIVE
Protein, ur: 100 mg/dL — AB
Specific Gravity, Urine: 1.012 (ref 1.005–1.030)
WBC, UA: >50 WBC/hpf — ABNORMAL HIGH (ref 0–5)
pH: 9 — ABNORMAL HIGH (ref 5.0–8.0)

## 2021-05-19 LAB — CBC
HCT: 39.4 % (ref 39.0–52.0)
Hemoglobin: 13.6 g/dL (ref 13.0–17.0)
MCH: 33.2 pg (ref 26.0–34.0)
MCHC: 34.5 g/dL (ref 30.0–36.0)
MCV: 96.1 fL (ref 80.0–100.0)
Platelets: 274 10*3/uL (ref 150–400)
RBC: 4.1 MIL/uL — ABNORMAL LOW (ref 4.22–5.81)
RDW: 12.6 % (ref 11.5–15.5)
WBC: 11 10*3/uL — ABNORMAL HIGH (ref 4.0–10.5)
nRBC: 0 % (ref 0.0–0.2)

## 2021-05-19 LAB — BASIC METABOLIC PANEL
Anion gap: 7 (ref 5–15)
BUN: 22 mg/dL (ref 8–23)
CO2: 21 mmol/L — ABNORMAL LOW (ref 22–32)
Calcium: 8.6 mg/dL — ABNORMAL LOW (ref 8.9–10.3)
Chloride: 109 mmol/L (ref 98–111)
Creatinine, Ser: 0.71 mg/dL (ref 0.61–1.24)
GFR, Estimated: >60 mL/min (ref >60)
Glucose, Bld: 121 mg/dL — ABNORMAL HIGH (ref 70–99)
Potassium: 3.3 mmol/L — ABNORMAL LOW (ref 3.5–5.1)
Sodium: 137 mmol/L (ref 135–145)

## 2021-05-19 LAB — GLUCOSE, CAPILLARY: Glucose-Capillary: 135 mg/dL — ABNORMAL HIGH (ref 70–99)

## 2021-05-19 MED ORDER — SODIUM CHLORIDE (PF) 0.9 % IJ SOLN
INTRAMUSCULAR | Status: AC
  Filled 2021-05-19: qty 50

## 2021-05-19 MED ORDER — SODIUM CHLORIDE 0.9 % IV SOLN
2.0000 g | INTRAVENOUS | Status: DC
  Administered 2021-05-19 – 2021-05-21 (×3): 2 g via INTRAVENOUS
  Filled 2021-05-19 (×3): qty 20

## 2021-05-19 MED ORDER — RINGERS IV SOLN
INTRAVENOUS | Status: DC
  Administered 2021-05-19: 75 mL/h via INTRAVENOUS

## 2021-05-19 MED ORDER — IOHEXOL 300 MG/ML  SOLN
100.0000 mL | Freq: Once | INTRAMUSCULAR | Status: AC | PRN
  Administered 2021-05-19: 100 mL via INTRAVENOUS

## 2021-05-19 MED ORDER — POTASSIUM CHLORIDE 20 MEQ PO PACK
40.0000 meq | PACK | Freq: Once | ORAL | Status: AC
  Administered 2021-05-19: 40 meq via ORAL
  Filled 2021-05-19: qty 2

## 2021-05-19 NOTE — Progress Notes (Addendum)
?PROGRESS NOTE ? ? ? ?Evan Morales  ION:[id number] DOB: 17-Aug-1945 DOA: 05/10/2021 ?PCP: Ria Bush, MD  ? ?Brief Narrative: ?76 year old with past medical history significant for hypertension, hyperlipidemia, glaucoma who presents to the hospital 4/13 from skilled nursing facility after syncopal episode, where he was found to have a blood pressure of 60/40. ?He had been at this facility for about 3 days.  He originally presented to the ED on 4/5 for weakness and was sent home with home health.  He returned to the ED on 4/9 for increased weakness lethargy and poor oral intake at home and was then discharged from the ED to skilled nursing facility. ? ?He presented again 4/13 noted to be hypotensive, also he had pulled out his Foley catheter and was having gross hematuria.  A Foley catheter was reinserted. ? ? ?Assessment & Plan: ?  ?Principal Problem: ?  Syncope ?Active Problems: ?  HYPERCHOLESTEROLEMIA ?  Essential hypertension ?  History of cerebrovascular accident (CVA) with residual deficit ?  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  Hypokalemia ?  Hyperbilirubinemia ?  AKI (acute kidney injury) ([place]) ?  Gross hematuria ?  Pressure injury of skin ?  Weakness ?  Vascular dementia ([place]) ?  Acute urinary retention ?  Chronic indwelling Foley catheter ?  Malnutrition of moderate degree ? ?Syncope: ?In the setting of dehydration, poor oral intake. ?2D echo grade 2 diastolic dysfunction ?DC fluids, and monitor oral intake.  ? ?FTT; Moderate malnutrition. Poor oral intake.  Patient  with poor oral intake.  Start ed on supplement ?Started on Megace.  ?Palliative care consulted for goals of care. Family had initial meeting with palliative care. Plan for subsequent meeting  today  to determine if patient will transition to hospice care. Hospice facility.  ?High risk for readmission ?TSH 2 weeks ago normal.  ?B 12: 246; started supplement.  ?Ammonia normal  and cortisol level  11. ?Treat oral Trush ? ?AKI: ?Admitted with a creatinine of 1.7.  After hydration has decreased to 0.7 ? ?Hyperkalemia: Replaced ?Chronic hyperbilirubinemia: Follow outpatient ? ?Hematuria/urinary retention: Foley catheter has been replaced and hematuria resolved ? ?Presume diagnosis of Dementia: started on B12. Prior b12 low normal range out patient.  ?History CVA>  ?Generalized weakness: Plan to transfer to rehab when stable./  ?Hypokalemia; replete orally.  ? ?Pressure Injury 05/10/21 Buttocks Left Stage 2 -  Partial thickness loss of dermis presenting as a shallow open injury with a red, pink wound bed without slough. (Active)  ?05/10/21 1909  ?Location: Buttocks  ?Location Orientation: Left  ?Staging: Stage 2 -  Partial thickness loss of dermis presenting as a shallow open injury with a red, pink wound bed without slough.  ?Wound Description (Comments):   ?Present on Admission: Yes  ?Dressing Type Foam - Lift dressing to assess site every shift 05/18/21 2025  ?   ?Pressure Injury 05/10/21 Buttocks Right Stage 2 -  Partial thickness loss of dermis presenting as a shallow open injury with a red, pink wound bed without slough. (Active)  ?05/10/21 1910  ?Location: Buttocks  ?Location Orientation: Right  ?Staging: Stage 2 -  Partial thickness loss of dermis presenting as a shallow open injury with a red, pink wound bed without slough.  ?Wound Description (Comments):   ?Present on Admission: Yes  ?Dressing Type Foam - Lift dressing to assess site every shift 05/18/21 2025  ? ? ? ?Nutrition Problem: Moderate Malnutrition ?Etiology: chronic illness ? ? ? ?  Signs/Symptoms: mild fat depletion, mild muscle depletion ? ? ? ?Interventions: Boost Breeze, MVI, Liberalize Diet ? ?Estimated body mass index is 27.18 kg/m? as calculated from the following: ?  Height as of this encounter: '5\' 11"'$  (1.803 m). ?  Weight as of this encounter: 88.4 kg. ? ? ?DVT prophylaxis: Lovenox ?Code Status: Full code ?Family Communication: Wife who  was at bedside 4/22. And daughter in law 4/20. ?Disposition Plan:  ?Status is: Inpatient ?Remains inpatient appropriate because: patient remain to have poor oral intake. Awaiting palliative care consultation. Patient is high risk for readmission.  ? ? ? ?Consultants:  ?Palliative care.  ? ?Procedures:  ?None ? ?Antimicrobials:  ?None ? ?Subjective: ?He denies pain. Per wife he ate some sausage today for breakfast  ? ?Objective: ?Vitals:  ? 05/18/21 2056 05/19/21 0500 05/19/21 0509 05/19/21 1202  ?BP: 100/65  140/81 136/84  ?Pulse: 68  72 71  ?Resp: 20  18   ?Temp: 98.5 ?F (36.9 ?C)  98 ?F (36.7 ?C) (!) 100.7 ?F (38.2 ?C)  ?TempSrc: Oral  Oral Oral  ?SpO2: 95%  97% 99%  ?Weight:  88.4 kg    ?Height:      ? ? ?Intake/Output Summary (Last 24 hours) at 05/19/2021 1252 ?Last data filed at 05/19/2021 1033 ?Gross per 24 hour  ?Intake 806 ml  ?Output 1475 ml  ?Net -669 ml  ? ? ?Filed Weights  ? 05/17/21 0500 05/18/21 0442 05/19/21 0500  ?Weight: 81.7 kg 87.3 kg 88.4 kg  ? ? ?Examination: ? ?General exam: NAD ?Respiratory system: CTA ?Cardiovascular system: S 1, S 2 RRR ? ? ? ? ?Data Reviewed: I have personally reviewed following labs and imaging studies ? ?CBC: ?Recent Labs  ?Lab 05/14/21 ?0453 05/15/21 ?0439 05/16/21 ?2951 05/17/21 ?0500 05/19/21 ?0759  ?WBC 11.3* 10.1 8.5 10.2 11.0*  ?HGB 13.4 14.2 13.3 13.0 13.6  ?HCT 39.3 42.0 39.5 37.9* 39.4  ?MCV 96.8 96.6 96.8 95.7 96.1  ?PLT 190 230 218 235 274  ? ? ?Basic Metabolic Panel: ?Recent Labs  ?Lab 05/13/21 ?0502 05/14/21 ?8841 05/14/21 ?1559 05/16/21 ?6606 05/19/21 ?0759  ?NA 139 138 140 140 137  ?K 3.3* 3.3* 4.5 3.7 3.3*  ?CL 107 110 111 114* 109  ?CO2 '22 22 25 22 '$ 21*  ?GLUCOSE 109* 116* 124* 113* 121*  ?BUN 28* 33* 33* 25* 22  ?CREATININE 0.63 0.75 0.51* 0.73 0.71  ?CALCIUM 8.9 8.7* 8.6* 8.4* 8.6*  ? ? ?GFR: ?Estimated Creatinine Clearance: 83.7 mL/min (by C-G formula based on SCr of 0.71 mg/dL). ?Liver Function Tests: ?Recent Labs  ?Lab 05/13/21 ?0502 05/14/21 ?3016   ?AST 57* 47*  ?ALT 38 45*  ?ALKPHOS 71 61  ?BILITOT 2.1* 1.9*  ?PROT 6.8 6.3*  ?ALBUMIN 3.4* 3.0*  ? ? ?No results for input(s): LIPASE, AMYLASE in the last 168 hours. ?Recent Labs  ?Lab 05/17/21 ?0500  ?AMMONIA 26  ? ? ?Coagulation Profile: ?No results for input(s): INR, PROTIME in the last 168 hours. ?Cardiac Enzymes: ?No results for input(s): CKTOTAL, CKMB, CKMBINDEX, TROPONINI in the last 168 hours. ?BNP (last 3 results) ?No results for input(s): PROBNP in the last 8760 hours. ?HbA1C: ?No results for input(s): HGBA1C in the last 72 hours. ?CBG: ?Recent Labs  ?Lab 05/15/21 ?0109 05/16/21 ?0506 05/17/21 ?0507 05/18/21 ?3235 05/19/21 ?5732  ?GLUCAP 118* 116* 120* 99 135*  ? ? ?Lipid Profile: ?No results for input(s): CHOL, HDL, LDLCALC, TRIG, CHOLHDL, LDLDIRECT in the last 72 hours. ?Thyroid Function Tests: ?No results for input(s): TSH,  T4TOTAL, FREET4, T3FREE, THYROIDAB in the last 72 hours. ?Anemia Panel: ?No results for input(s): VITAMINB12, FOLATE, FERRITIN, TIBC, IRON, RETICCTPCT in the last 72 hours. ?Sepsis Labs: ?No results for input(s): PROCALCITON, LATICACIDVEN in the last 168 hours. ? ?Recent Results (from the past 240 hour(s))  ?MRSA Next Gen by PCR, Nasal     Status: None  ? Collection Time: 05/12/21  5:53 AM  ? Specimen: Nasal Mucosa; Nasal Swab  ?Result Value Ref Range Status  ? MRSA by PCR Next Gen NOT DETECTED NOT DETECTED Final  ?  Comment: (NOTE) ?The GeneXpert MRSA Assay (FDA approved for NASAL specimens only), ?is one component of a comprehensive MRSA colonization surveillance ?program. It is not intended to diagnose MRSA infection nor to guide ?or monitor treatment for MRSA infections. ?Test performance is not FDA approved in patients less than 2 years ?old. ?Performed at [facility], [address] ?[place], [place] [address] ?  ? ?  ? ? ? ? ? ?Radiology Studies: ?No results found. ? ? ? ? ? ?Scheduled Meds: ? atorvastatin  10 mg Oral QPM  ? brimonidine  1 drop Right  Eye BID  ? Chlorhexidine Gluconate Cloth  6 each Topical Daily  ? clopidogrel  75 mg Oral Daily  ? dorzolamide-timolol  1 drop Right Eye BID  ? enoxaparin (LOVENOX) injection  40 mg Subcutaneous Q24H  ? feedin

## 2021-05-19 NOTE — Plan of Care (Signed)

## 2021-05-19 NOTE — Progress Notes (Signed)
Notified on call provider about patient having a small, yellow mucous bowel movement. No new orders at this time. ?

## 2021-05-20 DIAGNOSIS — R55 Syncope and collapse: Secondary | ICD-10-CM | POA: Diagnosis not present

## 2021-05-20 DIAGNOSIS — N390 Urinary tract infection, site not specified: Secondary | ICD-10-CM

## 2021-05-20 DIAGNOSIS — G9341 Metabolic encephalopathy: Secondary | ICD-10-CM | POA: Diagnosis not present

## 2021-05-20 DIAGNOSIS — E440 Moderate protein-calorie malnutrition: Secondary | ICD-10-CM | POA: Diagnosis not present

## 2021-05-20 DIAGNOSIS — F0150 Vascular dementia without behavioral disturbance: Secondary | ICD-10-CM | POA: Diagnosis not present

## 2021-05-20 LAB — BASIC METABOLIC PANEL
Anion gap: 9 (ref 5–15)
BUN: 29 mg/dL — ABNORMAL HIGH (ref 8–23)
CO2: 18 mmol/L — ABNORMAL LOW (ref 22–32)
Calcium: 8.6 mg/dL — ABNORMAL LOW (ref 8.9–10.3)
Chloride: 110 mmol/L (ref 98–111)
Creatinine, Ser: 1.11 mg/dL (ref 0.61–1.24)
GFR, Estimated: >60 mL/min (ref >60)
Glucose, Bld: 159 mg/dL — ABNORMAL HIGH (ref 70–99)
Potassium: 4.1 mmol/L (ref 3.5–5.1)
Sodium: 137 mmol/L (ref 135–145)

## 2021-05-20 LAB — CBC
HCT: 44.6 % (ref 39.0–52.0)
Hemoglobin: 14.9 g/dL (ref 13.0–17.0)
MCH: 32.3 pg (ref 26.0–34.0)
MCHC: 33.4 g/dL (ref 30.0–36.0)
MCV: 96.7 fL (ref 80.0–100.0)
Platelets: 311 10*3/uL (ref 150–400)
RBC: 4.61 MIL/uL (ref 4.22–5.81)
RDW: 13.1 % (ref 11.5–15.5)
WBC: 22.3 10*3/uL — ABNORMAL HIGH (ref 4.0–10.5)
nRBC: 0 % (ref 0.0–0.2)

## 2021-05-20 LAB — GLUCOSE, CAPILLARY: Glucose-Capillary: 174 mg/dL — ABNORMAL HIGH (ref 70–99)

## 2021-05-20 LAB — AMMONIA: Ammonia: 30 umol/L (ref 9–35)

## 2021-05-20 MED ORDER — FLEET ENEMA 7-19 GM/118ML RE ENEM
1.0000 | ENEMA | Freq: Once | RECTAL | Status: AC
  Administered 2021-05-20: 1 via RECTAL
  Filled 2021-05-20: qty 1

## 2021-05-20 MED ORDER — ACETAMINOPHEN 650 MG RE SUPP
650.0000 mg | Freq: Four times a day (QID) | RECTAL | Status: DC | PRN
  Administered 2021-05-20: 650 mg via RECTAL
  Filled 2021-05-20: qty 1

## 2021-05-20 MED ORDER — LISINOPRIL 20 MG PO TABS
20.0000 mg | ORAL_TABLET | Freq: Every day | ORAL | Status: DC
  Administered 2021-05-21 – 2021-05-22 (×2): 20 mg via ORAL
  Filled 2021-05-20 (×3): qty 1

## 2021-05-20 NOTE — Progress Notes (Signed)
?PROGRESS NOTE ? ? ? ?Evan Morales  YJE:[id number] DOB: [birth year] DOA: 05/10/2021 ?PCP: Ria Bush, MD  ? ?Brief Narrative: ?76 year old with past medical history significant for hypertension, hyperlipidemia, glaucoma who presents to the hospital 4/13 from skilled nursing facility after syncopal episode, where he was found to have a blood pressure of 60/40. ?He had been at this facility for about 3 days.  He originally presented to the ED on 4/5 for weakness and was sent home with home health.  He returned to the ED on 4/9 for increased weakness lethargy and poor oral intake at home and was then discharged from the ED to skilled nursing facility. ? ?He presented again 4/13 noted to be hypotensive, also he had pulled out his Foley catheter and was having gross hematuria.  A Foley catheter was reinserted. ? ? ?Assessment & Plan: ?  ?Principal Problem: ?  Syncope ?Active Problems: ?  HYPERCHOLESTEROLEMIA ?  Essential hypertension ?  History of cerebrovascular accident (CVA) with residual deficit ?  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  Hypokalemia ?  Hyperbilirubinemia ?  AKI (acute kidney injury) ([place]) ?  Gross hematuria ?  Pressure injury of skin ?  Weakness ?  Vascular dementia ([place]) ?  Acute urinary retention ?  Chronic indwelling Foley catheter ?  Malnutrition of moderate degree ? ?Syncope: ?In the setting of dehydration, poor oral intake. ?2D echo grade 2 diastolic dysfunction ?DC fluids, and monitor oral intake.  ? ?FTT; Moderate malnutrition. Poor oral intake.  Patient  with poor oral intake.  Start ed on supplement ?Started on Megace.  ?Palliative care consulted for goals of care. Patient does not qualify for residential hospice. Plan for attempt to Rehab. No scalation of care in regards IV pressors, ICU admission. Ok with antibiotics and fluids.  ?High risk for readmission ?TSH 2 weeks ago normal.  ?B 12: 246; started supplement.  ?Ammonia normal  and cortisol  level 11. ?Treat oral Trush ? ?UTI; secondary to chronic foley catheter;  ?UA with more than 50 WBC.  ?Leukocytosis.  ?Started IV ceftriaxone. Urine culture ordered.  ? ?Constipation;  ?KUB small bowel dilation.  ?CT abdomen pelvis; sowed moderate stool borden in the colon.  ?Fleet enema ordered.  ? ?Acute Metabolic encephalopathy;  ?He is more lethargic today.  ?Suspect related to infection.  ? ?AKI: ?Admitted with a creatinine of 1.7.  After hydration has decreased to 0.7 ? ?Hyperkalemia: Replaced ?Chronic hyperbilirubinemia: Follow outpatient ? ?Hematuria/urinary retention: Foley catheter has been replaced and hematuria resolved ?CT showed distended bladder 4/23, foley catheter exchange yielding  urine, prior catheter had blood clots and was occluded.  ? ?Presume diagnosis of Dementia: started on B12. Prior b12 low normal range out patient.  ?History CVA>  ?Generalized weakness: Plan to transfer to rehab when stable./  ?Hypokalemia; replete orally.  ? ?Pressure Injury 05/10/21 Buttocks Left Stage 2 -  Partial thickness loss of dermis presenting as a shallow open injury with a red, pink wound bed without slough. (Active)  ?05/10/21 1909  ?Location: Buttocks  ?Location Orientation: Left  ?Staging: Stage 2 -  Partial thickness loss of dermis presenting as a shallow open injury with a red, pink wound bed without slough.  ?Wound Description (Comments):   ?Present on Admission: Yes  ?Dressing Type Foam - Lift dressing to assess site every shift 05/19/21 2318  ?   ?Pressure Injury 05/10/21 Buttocks Right Stage 2 -  Partial thickness loss of dermis presenting as  a shallow open injury with a red, pink wound bed without slough. (Active)  ?05/10/21 1910  ?Location: Buttocks  ?Location Orientation: Right  ?Staging: Stage 2 -  Partial thickness loss of dermis presenting as a shallow open injury with a red, pink wound bed without slough.  ?Wound Description (Comments):   ?Present on Admission: Yes  ?Dressing Type Foam - Lift  dressing to assess site every shift 05/19/21 2318  ? ? ? ?Nutrition Problem: Moderate Malnutrition ?Etiology: chronic illness ? ? ? ?Signs/Symptoms: mild fat depletion, mild muscle depletion ? ? ? ?Interventions: Boost Breeze, MVI, Liberalize Diet ? ?Estimated body mass index is 27.73 kg/m? as calculated from the following: ?  Height as of this encounter: '5\' 11"'$  (1.803 m). ?  Weight as of this encounter: 90.2 kg. ? ? ?DVT prophylaxis: Lovenox ?Code Status: Full code ?Family Communication: Wife who was at bedside 4/23. And daughter in law 4/20. ?Disposition Plan:  ?Status is: Inpatient ?Remains inpatient appropriate because: patient remain to have poor oral intake. Awaiting palliative care consultation. Patient is high risk for readmission.  ? ? ? ?Consultants:  ?Palliative care.  ? ?Procedures:  ?None ? ?Antimicrobials:  ?None ? ?Subjective: ?He lethargic today. Tenderness Lower abdomen.  ? ?Objective: ?Vitals:  ? 05/19/21 1202 05/19/21 2047 05/20/21 0435 05/20/21 0507  ?BP: 136/84 (!) 187/101  (!) 175/93  ?Pulse: 71 91  86  ?Resp:  20  18  ?Temp: (!) 100.7 ?F (38.2 ?C) 98.7 ?F (37.1 ?C)  98.9 ?F (37.2 ?C)  ?TempSrc: Oral Oral  Oral  ?SpO2: 99% 97%  96%  ?Weight:   90.2 kg   ?Height:      ? ? ?Intake/Output Summary (Last 24 hours) at 05/20/2021 1056 ?Last data filed at 05/20/2021 0620 ?Gross per 24 hour  ?Intake 1251.25 ml  ?Output 200 ml  ?Net 1051.25 ml  ? ? ?Filed Weights  ? 05/18/21 0442 05/19/21 0500 05/20/21 0435  ?Weight: 87.3 kg 88.4 kg 90.2 kg  ? ? ?Examination: ? ?General exam: NAD ?Respiratory system: CTA ?Cardiovascular system: S 1, S 2 RRR ?Abdomen; soft, tender lower quadrant.  ? ? ? ? ?Data Reviewed: I have personally reviewed following labs and imaging studies ? ?CBC: ?Recent Labs  ?Lab 05/15/21 ?0439 05/16/21 ?9509 05/17/21 ?0500 05/19/21 ?0759 05/20/21 ?0838  ?WBC 10.1 8.5 10.2 11.0* 22.3*  ?HGB 14.2 13.3 13.0 13.6 14.9  ?HCT 42.0 39.5 37.9* 39.4 44.6  ?MCV 96.6 96.8 95.7 96.1 96.7  ?PLT 230 218 235  274 311  ? ? ?Basic Metabolic Panel: ?Recent Labs  ?Lab 05/14/21 ?0453 05/14/21 ?1559 05/16/21 ?3267 05/19/21 ?0759  ?NA 138 140 140 137  ?K 3.3* 4.5 3.7 3.3*  ?CL 110 111 114* 109  ?CO2 '22 25 22 '$ 21*  ?GLUCOSE 116* 124* 113* 121*  ?BUN 33* 33* 25* 22  ?CREATININE 0.75 0.51* 0.73 0.71  ?CALCIUM 8.7* 8.6* 8.4* 8.6*  ? ? ?GFR: ?Estimated Creatinine Clearance: 83.7 mL/min (by C-G formula based on SCr of 0.71 mg/dL). ?Liver Function Tests: ?Recent Labs  ?Lab 05/14/21 ?1245  ?AST 47*  ?ALT 45*  ?ALKPHOS 61  ?BILITOT 1.9*  ?PROT 6.3*  ?ALBUMIN 3.0*  ? ? ?No results for input(s): LIPASE, AMYLASE in the last 168 hours. ?Recent Labs  ?Lab 05/17/21 ?0500  ?AMMONIA 26  ? ? ?Coagulation Profile: ?No results for input(s): INR, PROTIME in the last 168 hours. ?Cardiac Enzymes: ?No results for input(s): CKTOTAL, CKMB, CKMBINDEX, TROPONINI in the last 168 hours. ?BNP (last 3 results) ?No  results for input(s): PROBNP in the last 8760 hours. ?HbA1C: ?No results for input(s): HGBA1C in the last 72 hours. ?CBG: ?Recent Labs  ?Lab 05/16/21 ?0506 05/17/21 ?0507 05/18/21 ?4650 05/19/21 ?3546 05/20/21 ?5681  ?GLUCAP 116* 120* 99 135* 174*  ? ? ?Lipid Profile: ?No results for input(s): CHOL, HDL, LDLCALC, TRIG, CHOLHDL, LDLDIRECT in the last 72 hours. ?Thyroid Function Tests: ?No results for input(s): TSH, T4TOTAL, FREET4, T3FREE, THYROIDAB in the last 72 hours. ?Anemia Panel: ?No results for input(s): VITAMINB12, FOLATE, FERRITIN, TIBC, IRON, RETICCTPCT in the last 72 hours. ?Sepsis Labs: ?No results for input(s): PROCALCITON, LATICACIDVEN in the last 168 hours. ? ?Recent Results (from the past 240 hour(s))  ?MRSA Next Gen by PCR, Nasal     Status: None  ? Collection Time: 05/12/21  5:53 AM  ? Specimen: Nasal Mucosa; Nasal Swab  ?Result Value Ref Range Status  ? MRSA by PCR Next Gen NOT DETECTED NOT DETECTED Final  ?  Comment: (NOTE) ?The GeneXpert MRSA Assay (FDA approved for NASAL specimens only), ?is one component of a comprehensive MRSA  colonization surveillance ?program. It is not intended to diagnose MRSA infection nor to guide ?or monitor treatment for MRSA infections. ?Test performance is not FDA approved in patients less than 2 ye

## 2021-05-20 NOTE — Progress Notes (Signed)
?Daily Progress Note  ? ?Patient Name: Evan Morales       Date: 05/20/2021 ?DOB: 10/26/1945  Age: 76 y.o. MRN#: [id number] ?Attending Physician: Elmarie Shiley, MD ?Primary Care Physician: Ria Bush, MD ?Admit Date: 05/10/2021 ?Length of Stay: 9 days ? ?Reason for Consultation/Follow-up: Establishing goals of care ? ?HPI/Patient Profile:  76 y.o. male  with past medical history of HLD, glaucoma, HTN, CVA (5 years ago) who presented from outside her syncopal events.  He was noted to be hypotensive (60/40) and given a fluid bolus and brought to the ED for evaluation.  Noted poor appetite prior to arrival.  He was admitted on 05/10/2021 with syncope, malnutrition, pulm infiltrate, AKI, generalized weakness, and others.  ?  ?Patient is noted since third ED and/or admission in the past 2 weeks. ?  ?PMT was consulted for goals of care conversation. ? ?Subjective:  ? ?I saw and examined Evan Morales today.  He is lying in bed and is largely unresponsive.  Noted to have increased white count and was started on antibiotics and Foley was changed after noting on CT scan did not appear the last Foley was draining appropriately. ? ?Discussed with his wife at bedside and also attempted to call his son and left a voicemail. ? ?Yesterday, we have been talking about plan for transition for rehab, however, we discussed today significant concerns with how he has worsened again.  Discussed with her plan to continue to monitor but if this is another acute decline, this may be event for which she does not recover.  She expressed understanding. ? ?Review of Systems  ?Constitutional:  Positive for fatigue.  ?     Denies pain in general  ?Respiratory:  Negative for cough and shortness of breath.   ?Gastrointestinal:  Negative for abdominal pain, nausea and vomiting.  ? ?Objective:  ? ?Vital Signs:  ?BP 99/64 (BP Location: Right Arm)   Pulse 81   Temp 99.5 ?F (37.5 ?C) (Oral)   Resp 14   Ht '5\' 11"'$  (1.803 m)   Wt 90.2 kg    SpO2 98%   BMI 27.73 kg/m?  ? ?Physical Exam: ?Physical Exam ?Vitals and nursing note reviewed.  ?Constitutional:   ?   General: He is not in acute distress. ?   Appearance: He is ill-appearing.  ?   Comments: Somnolent  ?HENT:  ?   Head: Normocephalic and atraumatic.  ?Cardiovascular:  ?   Rate and Rhythm: Normal rate.  ?Pulmonary:  ?   Effort: Pulmonary effort is normal. No respiratory distress.  ?Abdominal:  ?   General: Abdomen is flat.  ?   Palpations: Abdomen is soft.  ?Skin: ?   General: Skin is warm and dry.  ? ? ?Palliative Assessment/Data: 10% ? ? ?Assessment & Plan:  ? ?Impression: ?Present on Admission: ? Syncope ? Essential hypertension ? HYPERCHOLESTEROLEMIA ? 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 ? ?SUMMARY OF RECOMMENDATIONS   ?Remain DNR ?Continue to treat the treatable, however he looks significantly worse today.   ?Plan to reassess tomorrow and continue to progress conversation based upon his clinical course. ?PMT will continue to follow ? ?Symptom Management:  ?Per primary team ?PMT is available to assist as needed ? ?Code Status: DNR ? ?Prognosis: Unable to determine ? ?Discharge Planning: To Be Determined ? ?Discussed with: Medical team, nursing team, patient, patient's family ? ?Total time: 45 minutes ? ?Micheline Rough, MD ?[facility] Team ?[phone] ? ?  Thank you for allowing [place] to participate in the care of Evan Morales ?PMT will continue to support holistically. ? ? ?

## 2021-05-20 NOTE — Progress Notes (Addendum)
?Daily Progress Note  ? ?Patient Name: Evan Morales       Date: 05/20/2021 ?DOB: May 02, 1945  Age: 76 y.o. MRN#: [id number] ?Attending Physician: Elmarie Shiley, MD ?Primary Care Physician: Ria Bush, MD ?Admit Date: 05/10/2021 ?Length of Stay: 9 days ? ?Reason for Consultation/Follow-up: Establishing goals of care ? ?HPI/Patient Profile:  76 y.o. male  with past medical history of HLD, glaucoma, HTN, CVA (5 years ago) who presented from outside her syncopal events.  He was noted to be hypotensive (60/40) and given a fluid bolus and brought to the ED for evaluation.  Noted poor appetite prior to arrival.  He was admitted on 05/10/2021 with syncope, malnutrition, pulm infiltrate, AKI, generalized weakness, and others.  ?  ?Patient is noted since third ED and/or admission in the past 2 weeks. ?  ?PMT was consulted for goals of care conversation. ? ?Subjective:  ? ?I met today with patient's wife, his wife son and daughter in law, and patient's son via phone. ? ?We discussed clinical course as well as wishes moving forward in regard to advanced directives and care plan this hospitalization.  We discussed difference between a aggressive medical intervention path and a palliative, comfort focused care path.  Values and goals of care important to patient and family were attempted to be elicited. ?  ?Concept of Hospice and Palliative Care were discussed. ? ?We reviewed the fact that he has had increased and his intake and mental status over the past 48 hours.  Chart review reveals that over the past couple of days he has had 3 boosts daily in addition to up to 85% of some meals. ? ?We discussed options for next steps including consideration for hospice versus seeing if he continues to improve with further rehab.  We discussed burdens and benefits of each of these and discussed all different options for discharge plans from the hospital.  In his current functional status, he cannot be cared for at home even  with hospice support.  I do not think with his increasing intake that he is appropriate at this point for residential hospice.  Family is hopeful he will continue to improve and are open to further attempts at rehab at this point. ? ?We discussed that the hospital can be useful as long as he is getting well enough from care he receives at the hospital to enjoy his time at home, but there is going to come a time in the near future where, electing his hospice benefits may be the best way to promote best quality of life moving forward.. ? ?Family would like to see if he can be transition back to skilled facility to continue with attempts at rehab. He has done well with rehabbing in the past. If he does well and continues to thrive, I encouraged they continue with this plan. If, however he is unable to regain function and he continues to decline, I recommended that he be followed by outpatient palliative care to determine if he may be better served by focusing his care on staying at home with support of organization such as hospice. ? ?Review of Systems  ?Constitutional:  Positive for fatigue.  ?     Denies pain in general  ?Respiratory:  Negative for cough and shortness of breath.   ?Gastrointestinal:  Negative for abdominal pain, nausea and vomiting.  ? ?Objective:  ? ?Vital Signs:  ?BP (!) 175/93 (BP Location: Right Arm)   Pulse 86   Temp 98.9 ?F (37.2 ?C) (  Oral)   Resp 18   Ht _0  (1.803 m)   Wt 90.2 kg   SpO2 96%   BMI 27.73 kg/m?  ? ?Physical Exam: ?Physical Exam ?Vitals and nursing note reviewed.  ?Constitutional:   ?   General: He is not in acute distress. ?   Appearance: He is ill-appearing.  ?HENT:  ?   Head: Normocephalic and atraumatic.  ?Cardiovascular:  ?   Rate and Rhythm: Normal rate.  ?Pulmonary:  ?   Effort: Pulmonary effort is normal. No respiratory distress.  ?Abdominal:  ?   General: Abdomen is flat.  ?   Palpations: Abdomen is soft.  ?Skin: ?   General: Skin is warm and dry.   ?Neurological:  ?   Mental Status: He is alert.  ?Psychiatric:     ?   Mood and Affect: Mood normal.     ?   Behavior: Behavior normal.  ? ? ?Palliative Assessment/Data: 50% ? ? ?Assessment & Plan:  ? ?Impression: ?Present on Admission: ? Syncope ? Essential hypertension ? HYPERCHOLESTEROLEMIA ? 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 ?76 year old male with multiple comorbidities and recent significant decline including weakness, unable to ambulate on his own, frequent falls, multiple ER visits/hospitalizations in the past month.  The patient's wife who is his primary caregiver has also had significant cognitive decline and is having more difficulty caring for him.  At this point it appears he may be approaching end-of-life. Today he is much more interactive, but nursing she feels that progresses. he typically is expected for couple hours and then he regresses.  Regardless, it is unlikely that his wife will be able to adequately care for him at home and is safe situation.  He will remain at high risk for frequent falls, frequent readmissions, and declining quality of life.  I feel he is hospice appropriate at this time given his chronic illnesses, high risk for recurrent falls/injury, readmissions.  However, we will plan to meet with the family tomorrow at 3:00pm to discuss all of this and try to make decisions for how to best move forward and care for him.  If they do elect hospice the daughter-in-law does not feel that he could be adequately cared for at home and I seem to agree. However, he may not be residential hospice appropriate at this point.  Overall prognosis is poor. ? ?SUMMARY OF RECOMMENDATIONS   ?Remain DNR ?Continue to treat the treatable ?If he continues to improve/remains stable, family would like to pursue skilled facility at [facility] for further rehab ?PMT will continue to follow ? ?Symptom Management:  ?Per primary team ?PMT is available to assist as  needed ? ?Code Status: DNR ? ?Prognosis: Unable to determine ? ?Discharge Planning: To Be Determined ? ?Discussed with: Medical team, nursing team, patient, patient's family ? ?Total time: 65 minutes ? ?Micheline Rough, MD ?[place] Team ?[phone] ? ?Thank you for allowing [place] to participate in the care of Evan Morales ?PMT will continue to support holistically. ? ? ?

## 2021-05-21 DIAGNOSIS — F0150 Vascular dementia without behavioral disturbance: Secondary | ICD-10-CM | POA: Diagnosis not present

## 2021-05-21 DIAGNOSIS — E440 Moderate protein-calorie malnutrition: Secondary | ICD-10-CM | POA: Diagnosis not present

## 2021-05-21 DIAGNOSIS — R55 Syncope and collapse: Secondary | ICD-10-CM | POA: Diagnosis not present

## 2021-05-21 DIAGNOSIS — G9341 Metabolic encephalopathy: Secondary | ICD-10-CM | POA: Diagnosis not present

## 2021-05-21 LAB — BASIC METABOLIC PANEL
Anion gap: 8 (ref 5–15)
BUN: 31 mg/dL — ABNORMAL HIGH (ref 8–23)
CO2: 20 mmol/L — ABNORMAL LOW (ref 22–32)
Calcium: 8.3 mg/dL — ABNORMAL LOW (ref 8.9–10.3)
Chloride: 113 mmol/L — ABNORMAL HIGH (ref 98–111)
Creatinine, Ser: 0.68 mg/dL (ref 0.61–1.24)
GFR, Estimated: >60 mL/min (ref >60)
Glucose, Bld: 90 mg/dL (ref 70–99)
Potassium: 3.9 mmol/L (ref 3.5–5.1)
Sodium: 141 mmol/L (ref 135–145)

## 2021-05-21 LAB — GLUCOSE, CAPILLARY: Glucose-Capillary: 87 mg/dL (ref 70–99)

## 2021-05-21 LAB — CBC
HCT: 37.4 % — ABNORMAL LOW (ref 39.0–52.0)
Hemoglobin: 12.3 g/dL — ABNORMAL LOW (ref 13.0–17.0)
MCH: 33 pg (ref 26.0–34.0)
MCHC: 32.9 g/dL (ref 30.0–36.0)
MCV: 100.3 fL — ABNORMAL HIGH (ref 80.0–100.0)
Platelets: 273 10*3/uL (ref 150–400)
RBC: 3.73 MIL/uL — ABNORMAL LOW (ref 4.22–5.81)
RDW: 13.4 % (ref 11.5–15.5)
WBC: 20.5 10*3/uL — ABNORMAL HIGH (ref 4.0–10.5)
nRBC: 0 % (ref 0.0–0.2)

## 2021-05-21 MED ORDER — LACTATED RINGERS IV SOLN: INTRAVENOUS | Status: DC

## 2021-05-21 MED ORDER — SENNOSIDES-DOCUSATE SODIUM 8.6-50 MG PO TABS
1.0000 | ORAL_TABLET | Freq: Two times a day (BID) | ORAL | Status: DC
  Administered 2021-05-22 – 2021-05-23 (×3): 1 via ORAL
  Filled 2021-05-21 (×3): qty 1

## 2021-05-21 NOTE — Care Management Important Message (Signed)
Important Message ? ?Patient Details IM Letter placed in the Patients room. ?Name: Evan Morales ?MRN: [id number] ?Date of Birth: 24-Jul-1945 ? ? ?Medicare Important Message Given:  Yes ? ? ? ? ?Kerin Salen ?05/21/2021, 1:21 PM ?

## 2021-05-21 NOTE — TOC Progression Note (Signed)
Transition of Care (TOC) - Progression Note  ? ? ?Patient Details  ?Name: Evan Morales ?MRN: [id number] ?Date of Birth: 08-10-1945 ? ?Transition of Care (TOC) CM/SW Contact  ?Dessa Phi, RN ?Phone Number: ?05/21/2021, 10:20 AM ? ?Clinical Narrative: Noted Palliative Care Team following-await recc.   ? ? ? ?Expected Discharge Plan: [place] ?Barriers to Discharge: Continued Medical Work up ? ?Expected Discharge Plan and Services ?Expected Discharge Plan: [place] ?  ?Discharge Planning Services: CM Consult ?Post Acute Care Choice: [place] ?Living arrangements for the past 2 months: [place] ?                ?  ?  ?  ?  ?  ?  ?  ?  ?  ?  ? ? ?Social Determinants of Health (SDOH) Interventions ?  ? ?Readmission Risk Interventions ?   ? View : No data to display.  ?  ?  ?  ? ? ?

## 2021-05-21 NOTE — Progress Notes (Signed)
Nutrition Follow-up ? ?DOCUMENTATION CODES:  ? ?Non-severe (moderate) malnutrition in context of chronic illness ? ?INTERVENTION:  ?- continue Boost Breeze TID. ?- monitor for ongoing [place] decisions. ? ? ?NUTRITION DIAGNOSIS:  ? ?Moderate Malnutrition related to chronic illness as evidenced by mild fat depletion, mild muscle depletion. -ongoing ? ?GOAL:  ? ?Patient will meet greater than or equal to 90% of their needs -unmet ? ?MONITOR:  ? ?PO intake, Supplement acceptance, Labs, Weight trends ? ?ASSESSMENT:  ? ?76 year old male with medical history of HTN, HLD, R eye blindness, CVA, degenerative arthritis, pre-diabetes, and glaucoma. He presented to the ED from [facility] after syncopal episode during which BP was 60/40. He had been at SNF x3 days after d/c from [facility] after admission for increased weakness, lethargy, and poor oral intake at home. ? ?Patient noted to be a/o to self only. Palliative Care following for conversations about [place]. Yesterday, PPS was noted as 10%.  ? ?Meal intakes over the past 4 days have been 25% on average, with two outliers of 50% and 80%.  ? ?He has been accepting Boost Breeze ~75%, but at times has not been safe enough for PO intake so it has not been offered.  ? ?Weight has been fluctuating daily since admission on 4/13. No information documented in the edema section of flow sheet this hospitalization.  ?  ? ?Labs reviewed; Cl: 113 mmol/l, Ca: 8.3 mg/dl, BUN: 31 mg/dl. ? ?Medications reviewed; 400 mg megace BID started 4/18, 1 tablet multivitamin with minerals/day, 5 ml mycostatin QID, 1 tablet senokot BID, 250 mcg oral cyanocobalamin/day. ? ?IVF; LR @ 75 ml/hr. ? ? ?Diet Order:   ?Diet Order   ? ?       ?  Diet regular Room service appropriate? Yes; Fluid consistency: Thin  Diet effective now       ?  ? ?  ?  ? ?  ? ? ?EDUCATION NEEDS:  ? ?Not appropriate for education at this time ? ?Skin:  Skin Assessment: Skin Integrity Issues: ?Skin Integrity Issues:: Stage II ?Stage  II: bilateral buttocks ? ?Last BM:  4/24 (type 7 x3, one medium amount and two large amount) ? ?Height:  ? ?Ht Readings from Last 1 Encounters:  ?05/18/21 '5\' 11"'$  (1.803 m)  ? ? ?Weight:  ? ?Wt Readings from Last 1 Encounters:  ?05/21/21 89.7 kg  ? ? ? ?BMI:  Body mass index is 27.58 kg/m?. ? ?Estimated Nutritional Needs:  ?Kcal:  2000-2200 kcal ?Protein:  100-115 grams ?Fluid:  >/= 2.2 L/day ? ? ? ? ?Jarome Matin, MS, RD, LDN ?Registered Dietitian II ?Inpatient Clinical Nutrition ?RD pager # and on-call/weekend pager # available in [place]  ? ?

## 2021-05-21 NOTE — Progress Notes (Signed)
Physical Therapy Treatment ?Patient Details ?Name: Evan Morales ?MRN: [id number] ?DOB: January 20, 1946 ?Today's Date: 05/21/2021 ? ? ?History of Present Illness Pt is 76 yo male admitted 05/10/21 from SNF after syncopal episode (wife reports poor oral intake).  Pt also pulled out foley catheter with gross hematuria. Pt with multiple recent hospital visits 4/5 in ED and sent home then 4/9 in ED , seen by PT and went SNF from ER. Pt has hx of HLD, glaucoma, HTN, CVA, and likely vascular dementia per H and P. ? ?  ?PT Comments  ? ? Patient was limited this session by fatigue and increased weakness compared to session on 05/18/21.  Patient required Max +2 assist for bed mobility and despite being able to stand 4x from EOB was unable to coordinate lateral steps to move from bed>chair. Pt was noted to have heavy Rt lean in standing today and was unable to weight shift Lt to initiate Rt step. Pt also limited by watery BM and incontinence with standing. EOS pt returned to supine and positioned in chair position in bed to encourage pt to be alert and awake during the day. Will continue to progress pt as able in acute setting, continue to recommend SNF for rehab and 24/7 care. ? ?  ?Recommendations for follow up therapy are one component of a multi-disciplinary discharge planning process, led by the attending physician.  Recommendations may be updated based on patient status, additional functional criteria and insurance authorization. ? ?Follow Up Recommendations ? Skilled nursing-short term rehab (<3 hours/day) ?  ?  ?Assistance Recommended at Discharge Frequent or constant Supervision/Assistance  ?Patient can return home with the following Two people to help with walking and/or transfers;Two people to help with bathing/dressing/bathroom;Assistance with cooking/housework;Assistance with feeding;Direct supervision/assist for medications management;Direct supervision/assist for financial management ?  ?Equipment Recommendations ?  BSC/3in1;Wheelchair (measurements PT);Wheelchair cushion (measurements PT);Hospital bed  ?  ?Recommendations for Other Services   ? ? ?  ?Precautions / Restrictions Precautions ?Precautions: Fall ?Restrictions ?Weight Bearing Restrictions: No  ?  ? ?Mobility ? Bed Mobility ?Overal bed mobility: Needs Assistance ?Bed Mobility: Supine to Sit ?  ?  ?Supine to sit: Max assist, +2 for safety/equipment, +2 for physical assistance, HOB elevated ?  ?  ?General bed mobility comments: +2 Max Assist with bed pad to pivot hips and bring LE's off EOB and raise trunk. Max/Total +2 to return to supine and boost in bed at EOS. ?  ? ?Transfers ?Overall transfer level: Needs assistance ?Equipment used: Rolling walker (2 wheels), 2 person hand held assist ?Transfers: Sit to/from Stand ?Sit to Stand: Mod assist, +2 physical assistance, +2 safety/equipment, From elevated surface, Max assist ?  ?  ?  ?  ?  ?General transfer comment: Mod-Max Assist for pt to rise from EOB. Pt completed 4x from EOB and was limited by liquid BM incontinence requiring time to sit on bedpan at EOB. ?  ? ?Ambulation/Gait ?Ambulation/Gait assistance: Mod assist, +2 physical assistance ?Gait Distance (Feet): 2 Feet ?Assistive device: Rolling walker (2 wheels) ?Gait Pattern/deviations: Step-to pattern, Decreased stride length, Knee flexed in stance - right, Knee flexed in stance - left, Shuffle, Trunk flexed, Narrow base of support ?Gait velocity: decr ?  ?  ?General Gait Details: small side step towards HOB, limited by weakness Rt>Lt. Pt leaning Rt and unable to shift to LE to initiate Rt side step. ? ? ?Stairs ?  ?  ?  ?  ?  ? ? ?Wheelchair Mobility ?  ? ?Modified  Rankin (Stroke Patients Only) ?  ? ? ?  ?Balance Overall balance assessment: Needs assistance ?Sitting-balance support: Feet supported ?Sitting balance-Leahy Scale: Fair ?Sitting balance - Comments: min guard for safety with bil UE support ?  ?Standing balance support: Bilateral upper extremity  supported ?Standing balance-Leahy Scale: Zero ?Standing balance comment: Mod +2 with RW ?  ?  ?  ?  ?  ?  ?  ?  ?  ?  ?  ?  ? ?  ?Cognition Arousal/Alertness: Awake/alert ?Behavior During Therapy: Flat affect ?Overall Cognitive Status: Impaired/Different from baseline ?Area of Impairment: Memory, Following commands, Problem solving, Orientation, Attention, Safety/judgement, Awareness ?  ?  ?  ?  ?  ?  ?  ?  ?  ?Current Attention Level: Sustained ?Memory: Decreased short-term memory, Decreased recall of precautions ?Following Commands: Follows one step commands with increased time, Follows one step commands consistently, Follows multi-step commands inconsistently, Follows multi-step commands with increased time ?  ?Awareness: Emergent ?  ?General Comments: pt drowsy with eyes closed but alerted to voices and mroe alert with mobility ?  ?  ? ?  ?Exercises   ? ?  ?General Comments   ?  ?  ? ?Pertinent Vitals/Pain Pain Assessment ?Pain Assessment: PAINAD ?Breathing: normal ?Negative Vocalization: none ?Facial Expression: smiling or inexpressive ?Body Language: relaxed ?Consolability: no need to console ?PAINAD Score: 0 ?Pain Intervention(s): Monitored during session, Limited activity within patient's tolerance, Repositioned  ? ? ?Home Living   ?  ?  ?  ?  ?  ?  ?  ?  ?  ?   ?  ?Prior Function    ?  ?  ?   ? ?PT Goals (current goals can now be found in the care plan section) Acute Rehab PT Goals ?Patient Stated Goal: family agreeable for return to SNF ?PT Goal Formulation: With patient/family ?Time For Goal Achievement: 05/25/21 ?Potential to Achieve Goals: Fair ?Progress towards PT goals: Progressing toward goals ? ?  ?Frequency ? ? ? Min 2X/week ? ? ? ?  ?PT Plan Current plan remains appropriate  ? ? ?Co-evaluation   ?  ?  ?  ?  ? ?  ?AM-PAC PT "6 Clicks" Mobility   ?Outcome Measure ? Help needed turning from your back to your side while in a flat bed without using bedrails?: Total ?Help needed moving from lying on  your back to sitting on the side of a flat bed without using bedrails?: Total ?Help needed moving to and from a bed to a chair (including a wheelchair)?: Total ?Help needed standing up from a chair using your arms (e.g., wheelchair or bedside chair)?: Total ?Help needed to walk in hospital room?: Total ?Help needed climbing 3-5 steps with a railing? : Total ?6 Click Score: 6 ? ?  ?End of Session Equipment Utilized During Treatment: Gait belt ?Activity Tolerance: Treatment limited secondary to agitation ?Patient left: with call bell/phone within reach;with family/visitor present;with nursing/sitter in room;in chair ?Nurse Communication: Mobility status ?PT Visit Diagnosis: Unsteadiness on feet (R26.81);Muscle weakness (generalized) (M62.81);Difficulty in walking, not elsewhere classified (R26.2) ?  ? ? ?Time: 0076-2263 ?PT Time Calculation (min) (ACUTE ONLY): 27 min ? ?Charges:  $Therapeutic Activity: 23-37 mins          ?          ? ?Gwynneth Albright PT, DPT ?Acute Rehabilitation Services ?Office [phone] ?Pager [phone]  ? ? ?Jacques Navy ?05/21/2021, 3:04 PM ? ?

## 2021-05-21 NOTE — Progress Notes (Signed)
?PROGRESS NOTE ? ? ? ?Evan Morales  ERD:[id number] DOB: 01-07-1946 DOA: 05/10/2021 ?PCP: Ria Bush, MD  ? ?Brief Narrative: ?76 year old with past medical history significant for hypertension, hyperlipidemia, glaucoma who presents to the hospital 4/13 from skilled nursing facility after syncopal episode, where he was found to have a blood pressure of 60/40. ?He had been at this facility for about 3 days.  He originally presented to the ED on 4/5 for weakness and was sent home with home health.  He returned to the ED on 4/9 for increased weakness lethargy and poor oral intake at home and was then discharged from the ED to skilled nursing facility. ? ?He presented again 4/13 noted to be hypotensive, also he had pulled out his Foley catheter and was having gross hematuria.  A Foley catheter was reinserted. ? ? ?Assessment & Plan: ?  ?Principal Problem: ?  Syncope ?Active Problems: ?  HYPERCHOLESTEROLEMIA ?  Essential hypertension ?  History of cerebrovascular accident (CVA) with residual deficit ?  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  Hypokalemia ?  Hyperbilirubinemia ?  AKI (acute kidney injury) ([place]) ?  Gross hematuria ?  Pressure injury of skin ?  Weakness ?  Vascular dementia ([place]) ?  Acute urinary retention ?  Chronic indwelling Foley catheter ?  Malnutrition of moderate degree ?  Acute metabolic encephalopathy ?  UTI (urinary tract infection) ? ?Syncope: ?In the setting of dehydration, poor oral intake. ?2D echo grade 2 diastolic dysfunction ?DC fluids, and monitor oral intake.  ? ?FTT; Moderate malnutrition. Poor oral intake.  Patient  with poor oral intake.  Start ed on supplement ?Started on Megace.  ?Palliative care consulted for goals of care. Plan for attempt to Rehab. No scalation of care in regards IV pressors, ICU admission. Ok with antibiotics and fluids.  ?TSH 2 weeks ago normal.  ?B 12: 246; started supplement.  ?Ammonia normal  and cortisol level  11. ?Treat oral Trush ? ?UTI; secondary to chronic foley catheter;  ?UA with more than 50 WBC.  ?Leukocytosis.  ?Continue with  IV ceftriaxone. Urine culture: Proteus Mirabilis.  ? ?Constipation;  ?KUB small bowel dilation.  ?CT abdomen pelvis; sowed moderate stool borden in the colon.  ?Fleet enema 4/23. ?Had BM 4/23. ? ?Acute Metabolic encephalopathy;  ?He was lethargic 4/23. He is more alert, answer questions. Ate breakfast per wife.  ?Suspect related to infection.  ? ?AKI: ?Admitted with a creatinine of 1.7.  After hydration has decreased to 0.7 ? ?Hyperkalemia: Replaced ?Chronic hyperbilirubinemia: Follow outpatient ? ?Hematuria/urinary retention: Foley catheter has been replaced and hematuria resolved ?CT showed distended bladder 4/23, foley catheter exchange yielding  urine, prior catheter had blood clots and was occluded.  ? ?Presume diagnosis of Dementia: started on B12. Prior b12 low normal range out patient.  ?History CVA>  ?Generalized weakness: Plan to transfer to rehab when stable./  ?Hypokalemia; Replaced/  ? ?Pressure Injury 05/10/21 Buttocks Left Stage 2 -  Partial thickness loss of dermis presenting as a shallow open injury with a red, pink wound bed without slough. (Active)  ?05/10/21 1909  ?Location: Buttocks  ?Location Orientation: Left  ?Staging: Stage 2 -  Partial thickness loss of dermis presenting as a shallow open injury with a red, pink wound bed without slough.  ?Wound Description (Comments):   ?Present on Admission: Yes  ?Dressing Type Foam - Lift dressing to assess site every shift 05/20/21 2025  ?   ?Pressure Injury 05/10/21  Buttocks Right Stage 2 -  Partial thickness loss of dermis presenting as a shallow open injury with a red, pink wound bed without slough. (Active)  ?05/10/21 1910  ?Location: Buttocks  ?Location Orientation: Right  ?Staging: Stage 2 -  Partial thickness loss of dermis presenting as a shallow open injury with a red, pink wound bed without slough.  ?Wound Description  (Comments):   ?Present on Admission: Yes  ?Dressing Type Foam - Lift dressing to assess site every shift 05/20/21 2025  ? ? ? ?Nutrition Problem: Moderate Malnutrition ?Etiology: chronic illness ? ? ? ?Signs/Symptoms: mild fat depletion, mild muscle depletion ? ? ? ?Interventions: Boost Breeze, MVI, Liberalize Diet ? ?Estimated body mass index is 27.58 kg/m? as calculated from the following: ?  Height as of this encounter: '5\' 11"'$  (1.803 m). ?  Weight as of this encounter: 89.7 kg. ? ? ?DVT prophylaxis: Lovenox ?Code Status: Full code ?Family Communication: Wife who was at bedside 4/24. And daughter in law 4/20. ?Disposition Plan:  ?Status is: Inpatient ?Remains inpatient appropriate because: patient remain to have poor oral intake. Awaiting palliative care consultation. Patient is high risk for readmission.  ? ? ? ?Consultants:  ?Palliative care.  ? ?Procedures:  ?None ? ?Antimicrobials:  ?None ? ?Subjective: ?He is alert, answer question. Pleasantly confuse.  ?Had BM yesterday. Ate breakfast drinking endure.  ? ?Objective: ?Vitals:  ? 05/21/21 0446 05/21/21 0700 05/21/21 0800 05/21/21 1048  ?BP: 108/65   124/76  ?Pulse: 73     ?Resp: '18 15 12   '$ ?Temp: 98.9 ?F (37.2 ?C)     ?TempSrc: Oral     ?SpO2: 99%     ?Weight: 89.7 kg     ?Height:      ? ? ?Intake/Output Summary (Last 24 hours) at 05/21/2021 1200 ?Last data filed at 05/21/2021 1116 ?Gross per 24 hour  ?Intake 1125.5 ml  ?Output 1175 ml  ?Net -49.5 ml  ? ? ?Filed Weights  ? 05/19/21 0500 05/20/21 0435 05/21/21 0446  ?Weight: 88.4 kg 90.2 kg 89.7 kg  ? ? ?Examination: ? ?General exam: NAD ?Respiratory system: CTA ?Cardiovascular system: S 1, S 2 RRR ?Abdomen; soft, tender lower quadrant.  ? ? ? ? ?Data Reviewed: I have personally reviewed following labs and imaging studies ? ?CBC: ?Recent Labs  ?Lab 05/16/21 ?7673 05/17/21 ?0500 05/19/21 ?0759 05/20/21 ?4193 05/21/21 ?0408  ?WBC 8.5 10.2 11.0* 22.3* 20.5*  ?HGB 13.3 13.0 13.6 14.9 12.3*  ?HCT 39.5 37.9* 39.4 44.6  37.4*  ?MCV 96.8 95.7 96.1 96.7 100.3*  ?PLT 218 235 274 311 273  ? ? ?Basic Metabolic Panel: ?Recent Labs  ?Lab 05/14/21 ?1559 05/16/21 ?7902 05/19/21 ?0759 05/20/21 ?4097 05/21/21 ?0408  ?NA 140 140 137 137 141  ?K 4.5 3.7 3.3* 4.1 3.9  ?CL 111 114* 109 110 113*  ?CO2 25 22 21* 18* 20*  ?GLUCOSE 124* 113* 121* 159* 90  ?BUN 33* 25* 22 29* 31*  ?CREATININE 0.51* 0.73 0.71 1.11 0.68  ?CALCIUM 8.6* 8.4* 8.6* 8.6* 8.3*  ? ? ?GFR: ?Estimated Creatinine Clearance: 83.7 mL/min (by C-G formula based on SCr of 0.68 mg/dL). ?Liver Function Tests: ?No results for input(s): AST, ALT, ALKPHOS, BILITOT, PROT, ALBUMIN in the last 168 hours. ? ?No results for input(s): LIPASE, AMYLASE in the last 168 hours. ?Recent Labs  ?Lab 05/17/21 ?0500 05/20/21 ?1056  ?AMMONIA 26 30  ? ? ?Coagulation Profile: ?No results for input(s): INR, PROTIME in the last 168 hours. ?Cardiac Enzymes: ?No results for  input(s): CKTOTAL, CKMB, CKMBINDEX, TROPONINI in the last 168 hours. ?BNP (last 3 results) ?No results for input(s): PROBNP in the last 8760 hours. ?HbA1C: ?No results for input(s): HGBA1C in the last 72 hours. ?CBG: ?Recent Labs  ?Lab 05/17/21 ?0507 05/18/21 ?1245 05/19/21 ?8099 05/20/21 ?8338 05/21/21 ?0441  ?GLUCAP 120* 99 135* 174* 87  ? ? ?Lipid Profile: ?No results for input(s): CHOL, HDL, LDLCALC, TRIG, CHOLHDL, LDLDIRECT in the last 72 hours. ?Thyroid Function Tests: ?No results for input(s): TSH, T4TOTAL, FREET4, T3FREE, THYROIDAB in the last 72 hours. ?Anemia Panel: ?No results for input(s): VITAMINB12, FOLATE, FERRITIN, TIBC, IRON, RETICCTPCT in the last 72 hours. ?Sepsis Labs: ?No results for input(s): PROCALCITON, LATICACIDVEN in the last 168 hours. ? ?Recent Results (from the past 240 hour(s))  ?MRSA Next Gen by PCR, Nasal     Status: None  ? Collection Time: 05/12/21  5:53 AM  ? Specimen: Nasal Mucosa; Nasal Swab  ?Result Value Ref Range Status  ? MRSA by PCR Next Gen NOT DETECTED NOT DETECTED Final  ?  Comment: (NOTE) ?The  GeneXpert MRSA Assay (FDA approved for NASAL specimens only), ?is one component of a comprehensive MRSA colonization surveillance ?program. It is not intended to diagnose MRSA infection nor to guide ?or moni

## 2021-05-21 NOTE — Progress Notes (Signed)
?  Daily Progress Note  ? ?Patient Name: Evan Morales       Date: 05/21/2021 ?DOB: November 08, 1945  Age: 76 y.o. MRN#: [id number] ?Attending Physician: Elmarie Shiley, MD ?Primary Care Physician: Ria Bush, MD ?Admit Date: 05/10/2021 ?Length of Stay: 10 days ? ?Reason for Consultation/Follow-up: Establishing goals of care ? ?HPI/Patient Profile:  76 y.o. male  with past medical history of HLD, glaucoma, HTN, CVA (5 years ago) who presented from outside her syncopal events.  He was noted to be hypotensive (60/40) and given a fluid bolus and brought to the ED for evaluation.  Noted poor appetite prior to arrival.  He was admitted on 05/10/2021 with syncope, malnutrition, pulm infiltrate, AKI, generalized weakness, and others.  ?  ?Patient is noted since third ED and/or admission in the past 2 weeks. ?  ?PMT was consulted for goals of care conversation. ? ?Subjective:  ? ?I saw and examined Evan Morales today.  He is the most awake and alert that I have seen him.  He really participates in conversation in meaningful way today. ? ?His wife was feeding him lunch at time of my encounter and he had already also consumed boost this morning.  ? ?Discussed with his wife at the bedside and they are hopeful with this improvement that he will continue to grow stronger and would like to continue with trial of rehab with preference for [facility] per wife's report. ? ?Review of Systems  ?Constitutional:  Positive for fatigue.  ?     Denies pain in general  ?Respiratory:  Negative for cough and shortness of breath.   ?Gastrointestinal:  Negative for abdominal pain, nausea and vomiting.  ? ?Objective:  ? ?Vital Signs:  ?BP 118/67 (BP Location: Right Arm)   Pulse 72   Temp 98.2 ?F (36.8 ?C) (Oral)   Resp 17   Ht '5\' 11"'$  (1.803 m)   Wt 89.7 kg   SpO2 97%   BMI 27.58 kg/m?  ? ?Physical Exam: ?Physical Exam ?Vitals and nursing note reviewed.  ?Constitutional:   ?   General: He is not in acute distress. ?   Comments:  Awake and interactive  ?HENT:  ?   Head: Normocephalic and atraumatic.  ?Cardiovascular:  ?   Rate and Rhythm: Normal rate.  ?Pulmonary:  ?   Effort: Pulmonary effort is normal. No respiratory distress.  ?Abdominal:  ?   General: Abdomen is flat.  ?   Palpations: Abdomen is soft.  ?Skin: ?   General: Skin is warm and dry.  ? ? ?Palliative Assessment/Data: 40% ? ? ?Assessment & Plan:  ? ?Impression: ?Present on Admission: ? Syncope ? Essential hypertension ? HYPERCHOLESTEROLEMIA ? 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 ? ?SUMMARY OF RECOMMENDATIONS   ?Remain DNR ?Continue to treat treatable conditions.  He appears much brighter and is awake and alert today. ?With continued improvement, family goal is for rehab at [facility]. ?PMT will continue to follow ? ?Symptom Management:  ?Per primary team ?PMT is available to assist as needed ? ?Code Status: DNR ? ?Discharge Planning: [place] for rehab with Palliative care service follow-up ? ?Discussed with: Medical team, nursing team, patient, patient's family ? ?Total time: 40 minutes ? ?Micheline Rough, MD ?[facility] ?[phone] ? ?Thank you for allowing [place] to participate in the care of Evan Morales ?PMT will continue to support holistically. ? ? ?

## 2021-05-21 NOTE — Progress Notes (Signed)
Chaplain tried to engage in an initial visit with [name] and his wife but both were sleeping at time of visit.  Chaplain will follow-up. ? ? ? 05/21/21 1100  ?Clinical Encounter Type  ?Visited With Patient and family together;Patient not available  ?Visit Type Spiritual support  ?Referral From Palliative care team  ?Consult/Referral To Chaplain  ? ? ?

## 2021-05-22 DIAGNOSIS — R55 Syncope and collapse: Secondary | ICD-10-CM | POA: Diagnosis not present

## 2021-05-22 LAB — CBC
HCT: 36.1 % — ABNORMAL LOW (ref 39.0–52.0)
Hemoglobin: 12 g/dL — ABNORMAL LOW (ref 13.0–17.0)
MCH: 32.2 pg (ref 26.0–34.0)
MCHC: 33.2 g/dL (ref 30.0–36.0)
MCV: 96.8 fL (ref 80.0–100.0)
Platelets: 254 10*3/uL (ref 150–400)
RBC: 3.73 MIL/uL — ABNORMAL LOW (ref 4.22–5.81)
RDW: 13 % (ref 11.5–15.5)
WBC: 15.8 10*3/uL — ABNORMAL HIGH (ref 4.0–10.5)
nRBC: 0 % (ref 0.0–0.2)

## 2021-05-22 LAB — URINE CULTURE: Culture: >=100000 — AB

## 2021-05-22 LAB — BASIC METABOLIC PANEL
Anion gap: 6 (ref 5–15)
BUN: 16 mg/dL (ref 8–23)
CO2: 21 mmol/L — ABNORMAL LOW (ref 22–32)
Calcium: 8.2 mg/dL — ABNORMAL LOW (ref 8.9–10.3)
Chloride: 112 mmol/L — ABNORMAL HIGH (ref 98–111)
Creatinine, Ser: 0.46 mg/dL — ABNORMAL LOW (ref 0.61–1.24)
GFR, Estimated: >60 mL/min (ref >60)
Glucose, Bld: 113 mg/dL — ABNORMAL HIGH (ref 70–99)
Potassium: 3.1 mmol/L — ABNORMAL LOW (ref 3.5–5.1)
Sodium: 139 mmol/L (ref 135–145)

## 2021-05-22 LAB — GLUCOSE, CAPILLARY: Glucose-Capillary: 115 mg/dL — ABNORMAL HIGH (ref 70–99)

## 2021-05-22 MED ORDER — POLYETHYLENE GLYCOL 3350 17 G PO PACK
17.0000 g | PACK | Freq: Every day | ORAL | Status: DC
  Administered 2021-05-22: 17 g via ORAL
  Filled 2021-05-22 (×2): qty 1

## 2021-05-22 MED ORDER — POTASSIUM CHLORIDE 20 MEQ PO PACK
40.0000 meq | PACK | Freq: Two times a day (BID) | ORAL | Status: AC
  Administered 2021-05-22 (×2): 40 meq via ORAL
  Filled 2021-05-22 (×2): qty 2

## 2021-05-22 MED ORDER — AMOXICILLIN 250 MG PO CAPS
500.0000 mg | ORAL_CAPSULE | Freq: Three times a day (TID) | ORAL | Status: DC
  Administered 2021-05-22 – 2021-05-23 (×4): 500 mg via ORAL
  Filled 2021-05-22 (×4): qty 2

## 2021-05-22 NOTE — Progress Notes (Signed)
?PROGRESS NOTE ? ? ? ?Evan Morales  ZOX:[id number] DOB: 05-10-1945 DOA: 05/10/2021 ?PCP: Ria Bush, MD  ? ?Brief Narrative: ?76 year old with past medical history significant for hypertension, hyperlipidemia, glaucoma who presents to the hospital 4/13 from skilled nursing facility after syncopal episode, where he was found to have a blood pressure of 60/40. ?He had been at this facility for about 3 days.  He originally presented to the ED on 4/5 for weakness and was sent home with home health.  He returned to the ED on 4/9 for increased weakness lethargy and poor oral intake at home and was then discharged from the ED to skilled nursing facility. ? ?He presented again 4/13 noted to be hypotensive, also he had pulled out his Foley catheter and was having gross hematuria.  A Foley catheter was reinserted. ? ?Hospital course complicated by UTI, constipation, acute metabolic encephalopathy. ?Patient over the last 2 days have become more alert and following commands.  He has also increased oral intake.  Plan is for rehab.  ? ? ?Assessment & Plan: ?  ?Principal Problem: ?  Syncope ?Active Problems: ?  HYPERCHOLESTEROLEMIA ?  Essential hypertension ?  History of cerebrovascular accident (CVA) with residual deficit ?  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  Hypokalemia ?  Hyperbilirubinemia ?  AKI (acute kidney injury) ([place]) ?  Gross hematuria ?  Pressure injury of skin ?  Weakness ?  Vascular dementia ([place]) ?  Acute urinary retention ?  Chronic indwelling Foley catheter ?  Malnutrition of moderate degree ?  Acute metabolic encephalopathy ?  UTI (urinary tract infection) ? ? ?FTT; Moderate malnutrition. Poor oral intake.  Patient  with poor oral intake.  Started on supplement ?Started on Megace.  ?Palliative care consulted for goals of care. Plan for attempt to Rehab. No scalation of care in regards IV pressors, ICU admission. Ok with antibiotics and fluids.  ?TSH 2 weeks ago  normal.  ?B 12: 246; started supplement.  ?Ammonia normal  and cortisol level 11. ?Treat oral Trush ?Improving.  ? ?Syncope: ?In the setting of dehydration, poor oral intake. ?2D echo grade 2 diastolic dysfunction ?DC fluids, and monitor oral intake.  ? ?UTI; Secondary to chronic foley catheter;  ?UA with more than 50 WBC.  ?Leukocytosis.  ?Treated with   IV ceftriaxone. Urine culture: Proteus Mirabilis. Transition to oral Amoxicillin for total 7 days treatment.  ?WBC trending down.  ? ?Constipation;  ?KUB small bowel dilation.  ?CT abdomen pelvis; sowed moderate stool borden in the colon.  ?Fleet enema 4/23. ?Had BM 4/23. ? ?Acute Metabolic encephalopathy;  ?He was lethargic 4/23. He is more alert, answer questions. Ate breakfast per wife.  ?Suspect related to infection.  ? ?AKI: ?Admitted with a creatinine of 1.7.  After hydration has decreased to 0.7 ? ?Hyperkalemia: Replaced ?Chronic hyperbilirubinemia: Follow outpatient ? ?Hematuria/urinary retention: Foley catheter has been replaced and hematuria resolved ?CT showed distended bladder 4/23, foley catheter exchange yielding  urine, prior catheter had blood clots and was occluded.  ? ?Presume diagnosis of Dementia: started on B12. Prior b12 low normal range out patient.  ?History CVA>  ?Generalized weakness: Plan to transfer to rehab when stable./  ?Hypokalemia; replete orally.  ? ?Pressure Injury 05/10/21 Buttocks Left Stage 2 -  Partial thickness loss of dermis presenting as a shallow open injury with a red, pink wound bed without slough. (Active)  ?05/10/21 1909  ?Location: Buttocks  ?Location Orientation: Left  ?Staging: Stage  2 -  Partial thickness loss of dermis presenting as a shallow open injury with a red, pink wound bed without slough.  ?Wound Description (Comments):   ?Present on Admission: Yes  ?Dressing Type Foam - Lift dressing to assess site every shift 05/22/21 0900  ?   ?Pressure Injury 05/10/21 Buttocks Right Stage 2 -  Partial thickness loss of  dermis presenting as a shallow open injury with a red, pink wound bed without slough. (Active)  ?05/10/21 1910  ?Location: Buttocks  ?Location Orientation: Right  ?Staging: Stage 2 -  Partial thickness loss of dermis presenting as a shallow open injury with a red, pink wound bed without slough.  ?Wound Description (Comments):   ?Present on Admission: Yes  ?Dressing Type Foam - Lift dressing to assess site every shift 05/22/21 0900  ? ? ? ?Nutrition Problem: Moderate Malnutrition ?Etiology: chronic illness ? ? ? ?Signs/Symptoms: mild fat depletion, mild muscle depletion ? ? ? ?Interventions: Boost Breeze, MVI, Liberalize Diet ? ?Estimated body mass index is 27.58 kg/m? as calculated from the following: ?  Height as of this encounter: '5\' 11"'$  (1.803 m). ?  Weight as of this encounter: 89.7 kg. ? ? ?DVT prophylaxis: Lovenox ?Code Status: Full code ?Family Communication: Wife who was at bedside 4/24. And daughter in law 4/20. ?Disposition Plan:  ?Status is: Inpatient ?Remains inpatient appropriate because: SNF when bed available.  ? ? ? ?Consultants:  ?Palliative care.  ? ?Procedures:  ?None ? ?Antimicrobials:  ?None ? ?Subjective: ?He is alert,he needs to use bathroom. Denies pain.  ? ? ?Objective: ?Vitals:  ? 05/21/21 1700 05/21/21 1800 05/21/21 2036 05/22/21 0536  ?BP:   140/77 137/79  ?Pulse:   76 71  ?Resp: '16 15 19 20  '$ ?Temp:   97.9 ?F (36.6 ?C) 97.8 ?F (36.6 ?C)  ?TempSrc:   Oral Oral  ?SpO2:   97% 98%  ?Weight:      ?Height:      ? ? ?Intake/Output Summary (Last 24 hours) at 05/22/2021 1252 ?Last data filed at 05/22/2021 1236 ?Gross per 24 hour  ?Intake 1797.34 ml  ?Output 2125 ml  ?Net -327.66 ml  ? ? ?Filed Weights  ? 05/19/21 0500 05/20/21 0435 05/21/21 0446  ?Weight: 88.4 kg 90.2 kg 89.7 kg  ? ? ?Examination: ? ?General exam: NAD ?Respiratory system: CTA ?Cardiovascular system: S 1, S 2 RRR ?Abdomen; Soft, NT, ND ? ? ? ? ?Data Reviewed: I have personally reviewed following labs and imaging  studies ? ?CBC: ?Recent Labs  ?Lab 05/17/21 ?0500 05/19/21 ?0759 05/20/21 ?3614 05/21/21 ?0408 05/22/21 ?4315  ?WBC 10.2 11.0* 22.3* 20.5* 15.8*  ?HGB 13.0 13.6 14.9 12.3* 12.0*  ?HCT 37.9* 39.4 44.6 37.4* 36.1*  ?MCV 95.7 96.1 96.7 100.3* 96.8  ?PLT 235 274 311 273 254  ? ? ?Basic Metabolic Panel: ?Recent Labs  ?Lab 05/16/21 ?4008 05/19/21 ?0759 05/20/21 ?6761 05/21/21 ?0408 05/22/21 ?9509  ?NA 140 137 137 141 139  ?K 3.7 3.3* 4.1 3.9 3.1*  ?CL 114* 109 110 113* 112*  ?CO2 22 21* 18* 20* 21*  ?GLUCOSE 113* 121* 159* 90 113*  ?BUN 25* 22 29* 31* 16  ?CREATININE 0.73 0.71 1.11 0.68 0.46*  ?CALCIUM 8.4* 8.6* 8.6* 8.3* 8.2*  ? ? ?GFR: ?Estimated Creatinine Clearance: 83.7 mL/min (A) (by C-G formula based on SCr of 0.46 mg/dL (L)). ?Liver Function Tests: ?No results for input(s): AST, ALT, ALKPHOS, BILITOT, PROT, ALBUMIN in the last 168 hours. ? ?No results for input(s): LIPASE, AMYLASE in  the last 168 hours. ?Recent Labs  ?Lab 05/17/21 ?0500 05/20/21 ?1056  ?AMMONIA 26 30  ? ? ?Coagulation Profile: ?No results for input(s): INR, PROTIME in the last 168 hours. ?Cardiac Enzymes: ?No results for input(s): CKTOTAL, CKMB, CKMBINDEX, TROPONINI in the last 168 hours. ?BNP (last 3 results) ?No results for input(s): PROBNP in the last 8760 hours. ?HbA1C: ?No results for input(s): HGBA1C in the last 72 hours. ?CBG: ?Recent Labs  ?Lab 05/18/21 ?0981 05/19/21 ?1914 05/20/21 ?7829 05/21/21 ?0441 05/22/21 ?0533  ?GLUCAP 99 135* 174* 87 115*  ? ? ?Lipid Profile: ?No results for input(s): CHOL, HDL, LDLCALC, TRIG, CHOLHDL, LDLDIRECT in the last 72 hours. ?Thyroid Function Tests: ?No results for input(s): TSH, T4TOTAL, FREET4, T3FREE, THYROIDAB in the last 72 hours. ?Anemia Panel: ?No results for input(s): VITAMINB12, FOLATE, FERRITIN, TIBC, IRON, RETICCTPCT in the last 72 hours. ?Sepsis Labs: ?No results for input(s): PROCALCITON, LATICACIDVEN in the last 168 hours. ? ?Recent Results (from the past 240 hour(s))  ?Urine Culture      Status: Abnormal  ? Collection Time: 05/19/21  5:42 AM  ? Specimen: Urine, Clean Catch  ?Result Value Ref Range Status  ? Specimen Description   Final  ?  URINE, CLEAN CATCH ?Performed at [facility], [address]

## 2021-05-22 NOTE — TOC Progression Note (Addendum)
Transition of Care (TOC) - Progression Note  ? ? ?Patient Details  ?Name: Krew Hortman ?MRN: [id number] ?Date of Birth: Jul 19, 1945 ? ?Transition of Care (TOC) CM/SW Contact  ?Dessa Phi, RN ?Phone Number: ?05/22/2021, 12:36 PM ? ?Clinical Narrative:  Spoke to son Saralyn Pilar about d/c plans-agree to SNF-prefer [facility], & PT[facility]-left vm w/[employer] tel#[phone] call back for initiating auth for SNF-[facility] w/Palliative Care Services, & PTAR. ?-1:58p-received call from [facility] rep Connie-initiating auth for [facility] w/PCS, & PTAR-await outcome. ? ? ? ?Expected Discharge Plan: [place] ?Barriers to Discharge: Continued Medical Work up ? ?Expected Discharge Plan and Services ?Expected Discharge Plan: [place] ?  ?Discharge Planning Services: CM Consult ?Post Acute Care Choice: [place] ?Living arrangements for the past 2 months: [place] ?                ?  ?  ?  ?  ?  ?  ?  ?  ?  ?  ? ? ?Social Determinants of Health (SDOH) Interventions ?  ? ?Readmission Risk Interventions ?   ? View : No data to display.  ?  ?  ?  ? ? ?

## 2021-05-22 NOTE — Plan of Care (Signed)
  Problem: Nutrition: Goal: Adequate nutrition will be maintained Outcome: Progressing   Problem: Pain Managment: Goal: General experience of comfort will improve Outcome: Progressing   Problem: Safety: Goal: Ability to remain free from injury will improve Outcome: Progressing   

## 2021-05-23 DIAGNOSIS — G9341 Metabolic encephalopathy: Secondary | ICD-10-CM | POA: Diagnosis not present

## 2021-05-23 DIAGNOSIS — Z8673 Personal history of transient ischemic attack (TIA), and cerebral infarction without residual deficits: Secondary | ICD-10-CM

## 2021-05-23 DIAGNOSIS — R55 Syncope and collapse: Secondary | ICD-10-CM | POA: Diagnosis not present

## 2021-05-23 DIAGNOSIS — R41841 Cognitive communication deficit: Secondary | ICD-10-CM | POA: Diagnosis not present

## 2021-05-23 DIAGNOSIS — E876 Hypokalemia: Secondary | ICD-10-CM

## 2021-05-23 DIAGNOSIS — K5900 Constipation, unspecified: Secondary | ICD-10-CM | POA: Diagnosis not present

## 2021-05-23 DIAGNOSIS — N390 Urinary tract infection, site not specified: Secondary | ICD-10-CM | POA: Diagnosis not present

## 2021-05-23 DIAGNOSIS — E440 Moderate protein-calorie malnutrition: Secondary | ICD-10-CM | POA: Diagnosis not present

## 2021-05-23 DIAGNOSIS — R410 Disorientation, unspecified: Secondary | ICD-10-CM | POA: Diagnosis not present

## 2021-05-23 DIAGNOSIS — Z743 Need for continuous supervision: Secondary | ICD-10-CM | POA: Diagnosis not present

## 2021-05-23 DIAGNOSIS — L89302 Pressure ulcer of unspecified buttock, stage 2: Secondary | ICD-10-CM | POA: Diagnosis not present

## 2021-05-23 DIAGNOSIS — M6281 Muscle weakness (generalized): Secondary | ICD-10-CM | POA: Diagnosis not present

## 2021-05-23 DIAGNOSIS — R2689 Other abnormalities of gait and mobility: Secondary | ICD-10-CM | POA: Diagnosis not present

## 2021-05-23 DIAGNOSIS — I10 Essential (primary) hypertension: Secondary | ICD-10-CM | POA: Diagnosis not present

## 2021-05-23 DIAGNOSIS — H40012 Open angle with borderline findings, low risk, left eye: Secondary | ICD-10-CM | POA: Diagnosis not present

## 2021-05-23 DIAGNOSIS — T83511A Infection and inflammatory reaction due to indwelling urethral catheter, initial encounter: Secondary | ICD-10-CM

## 2021-05-23 DIAGNOSIS — R278 Other lack of coordination: Secondary | ICD-10-CM | POA: Diagnosis not present

## 2021-05-23 DIAGNOSIS — R279 Unspecified lack of coordination: Secondary | ICD-10-CM | POA: Diagnosis not present

## 2021-05-23 DIAGNOSIS — N179 Acute kidney failure, unspecified: Secondary | ICD-10-CM | POA: Diagnosis not present

## 2021-05-23 DIAGNOSIS — R310 Gross hematuria: Secondary | ICD-10-CM | POA: Diagnosis not present

## 2021-05-23 DIAGNOSIS — F01B0 Vascular dementia, moderate, without behavioral disturbance, psychotic disturbance, mood disturbance, and anxiety: Secondary | ICD-10-CM | POA: Diagnosis not present

## 2021-05-23 DIAGNOSIS — R1312 Dysphagia, oropharyngeal phase: Secondary | ICD-10-CM | POA: Diagnosis not present

## 2021-05-23 LAB — CBC
HCT: 36.9 % — ABNORMAL LOW (ref 39.0–52.0)
Hemoglobin: 12.2 g/dL — ABNORMAL LOW (ref 13.0–17.0)
MCH: 32.4 pg (ref 26.0–34.0)
MCHC: 33.1 g/dL (ref 30.0–36.0)
MCV: 97.9 fL (ref 80.0–100.0)
Platelets: 280 10*3/uL (ref 150–400)
RBC: 3.77 MIL/uL — ABNORMAL LOW (ref 4.22–5.81)
RDW: 13.1 % (ref 11.5–15.5)
WBC: 12.2 10*3/uL — ABNORMAL HIGH (ref 4.0–10.5)
nRBC: 0 % (ref 0.0–0.2)

## 2021-05-23 LAB — BASIC METABOLIC PANEL
Anion gap: 5 (ref 5–15)
BUN: 13 mg/dL (ref 8–23)
CO2: 22 mmol/L (ref 22–32)
Calcium: 8.5 mg/dL — ABNORMAL LOW (ref 8.9–10.3)
Chloride: 113 mmol/L — ABNORMAL HIGH (ref 98–111)
Creatinine, Ser: 0.54 mg/dL — ABNORMAL LOW (ref 0.61–1.24)
GFR, Estimated: >60 mL/min (ref >60)
Glucose, Bld: 103 mg/dL — ABNORMAL HIGH (ref 70–99)
Potassium: 3.7 mmol/L (ref 3.5–5.1)
Sodium: 140 mmol/L (ref 135–145)

## 2021-05-23 LAB — GLUCOSE, CAPILLARY: Glucose-Capillary: 108 mg/dL — ABNORMAL HIGH (ref 70–99)

## 2021-05-23 MED ORDER — MELATONIN 3 MG PO TABS: 3.0000 mg | ORAL_TABLET | Freq: Every evening | ORAL | 0 refills | Status: DC | PRN

## 2021-05-23 MED ORDER — AMOXICILLIN 500 MG PO CAPS: 500.0000 mg | ORAL_CAPSULE | Freq: Three times a day (TID) | ORAL | 0 refills | Status: AC

## 2021-05-23 MED ORDER — SENNOSIDES-DOCUSATE SODIUM 8.6-50 MG PO TABS: 1.0000 | ORAL_TABLET | Freq: Two times a day (BID) | ORAL | Status: AC

## 2021-05-23 MED ORDER — ADULT MULTIVITAMIN W/MINERALS CH: 1.0000 | ORAL_TABLET | Freq: Every day | ORAL | Status: AC

## 2021-05-23 NOTE — Assessment & Plan Note (Addendum)
In the setting of dehydration and UTI.  He could have some autonomic dysregulation as well.  TTE with LVEF of 60 to 65% and G2-DD.  Hydrated with IV fluid.  Now with good p.o. intake.  Treated with IV antibiotics for UTI. ?-Ensure adequate hydration ?-May consider TED hose/compression hose when up ?-Continue fall precaution ?

## 2021-05-23 NOTE — Assessment & Plan Note (Signed)
Stable.  Continue home meds and PT/OT ?

## 2021-05-23 NOTE — Assessment & Plan Note (Signed)
Patient was hypotensive on presentation.  Now normotensive. ?-Continue home lisinopril ?

## 2021-05-23 NOTE — Assessment & Plan Note (Signed)
Foley catheter replaced on 05/19/2021. ?

## 2021-05-23 NOTE — TOC Progression Note (Signed)
Transition of Care (TOC) - Progression Note  ? ? ?Patient Details  ?Name: Evan Morales ?MRN: [id number] ?Date of Birth: 1945-04-16 ? ?Transition of Care (TOC) CM/SW Contact  ?Dessa Phi, RN ?Phone Number: ?05/23/2021, 9:54 AM ? ?Clinical Narrative:Received auth await if [facility] can accept today.OEUM#[id number]. PTAR WERX#[id number].    ? ? ? ?Expected Discharge Plan: [place] ?Barriers to Discharge: Continued Medical Work up ? ?Expected Discharge Plan and Services ?Expected Discharge Plan: [place] ?  ?Discharge Planning Services: CM Consult ?Post Acute Care Choice: [place] ?Living arrangements for the past 2 months: [place] ?                ?  ?  ?  ?  ?  ?  ?  ?  ?  ?  ? ? ?Social Determinants of Health (SDOH) Interventions ?  ? ?Readmission Risk Interventions ?   ? View : No data to display.  ?  ?  ?  ? ? ?

## 2021-05-23 NOTE — Discharge Summary (Signed)
? ?Physician Discharge Summary  ?Evan Morales EHU:[id number] DOB: 05/22/1945 DOA: 05/10/2021 ? ?PCP: Ria Bush, MD ? ?Admit date: 05/10/2021 ?Discharge date: 05/23/2021 ?Admitted From: SNF ?Disposition: SNF ?Recommendations for Outpatient Follow-up:  ?Follow up with PCP in 1 to 2 weeks ?Please obtain CBC/BMP in 1 to 2 weeks ?Check blood pressure and adjust meds as appropriate ?Please follow up on the following pending results:  ? ? ?Discharge Condition: Stable ?CODE STATUS: DNR/DNI ? ? ?Hospital course ?76 year old M with PMH of vascular dementia, HTN, HLD, glaucoma and chronic Foley catheter brought to ED from SNF due to syncope and hypotension to 60/40, and admitted for syncope, metabolic encephalopathy, hypotension, catheter associated UTI, dehydration and constipation.  Patient was started on IV fluid for dehydration and AKI.  He was also started on IV ceftriaxone for UTI.  Foley catheter exchanged.  Urine culture grew pansensitive Proteus mirabilis.  Antibiotics de-escalated to p.o. amoxicillin.  Encephalopathy resolved with treatment of dehydration and UTI.  Other encephalopathy work-ups including TSH, B12, ammonia and cortisol level unrevealing.  Therapy recommended return to SNF.  He is discharged on p.o. amoxicillin to complete treatment course through 05/27/2021  ? ?See individual problem list below for more on hospital course. ? ?Problems addressed during this hospitalization ?Problem  ?Syncope  ?Acute Metabolic Encephalopathy  ?Catheter-Associated Urinary Tract Infection (Hcc)  ?Aki (Acute Kidney Injury) (Hcc)  ?Malnutrition of moderate degree  ?Weakness  ?Chronic Indwelling Foley Catheter  ?Constipation  ?History of Cva (Cerebrovascular Accident)  ? 02/2012: Low density in the left occipital lobe most consistent with acute or subacute infarction. No evidence of hemorrhage. ?Presented with R paresthesias and memory impairment which has persisted ? ?  ?Essential Hypertension  ?Acute Urinary  Retention (Resolved)  ?HYPERCHOLESTEROLEMIA (Resolved)  ?  ?Assessment and Plan: ?* Syncope ?In the setting of dehydration and UTI.  He could have some autonomic dysregulation as well.  TTE with LVEF of 60 to 65% and G2-DD.  Hydrated with IV fluid.  Now with good p.o. intake.  Treated with IV antibiotics for UTI. ?-Ensure adequate hydration ?-May consider TED hose/compression hose when up ?-Continue fall precaution ? ?Catheter-associated urinary tract infection ([place]) ?Urine culture with pansensitive Proteus mirabilis except to [place].  Foley catheter replaced on 4/22.  Received IV ceftriaxone in house.  Discharged on p.o. amoxicillin through 05/27/2021 to complete 7 days course. ? ?Acute metabolic encephalopathy ?Multifactorial including dehydration, infection, possible delirium and underlying dementia.  Seems to have resolved.  He is oriented to self, person and place but not time.  Basic encephalopathy work-up including TSH, B12 and ammonia unrevealing. ?-Reorientation and delirium precautions ? ?AKI (acute kidney injury) ([place]) ?Recent Labs  ?  05/12/21 ?0530 05/13/21 ?0502 05/14/21 ?0453 05/14/21 ?1559 05/16/21 ?7858 05/19/21 ?0759 05/20/21 ?8502 05/21/21 ?0408 05/22/21 ?7741 05/23/21 ?0507  ?BUN 20 28* 33* 33* 25* 22 29* 31* 16 13  ?CREATININE 0.68 0.63 0.75 0.51* 0.73 0.71 1.11 0.68 0.46* 0.54*  ?Resolved. ?-Recheck renal function in 1 to 2 weeks ? ? ?Malnutrition of moderate degree ?As evidenced by poor p.o. intake and significant muscle mass and subcu fat loss. ?Nutrition Problem: Moderate Malnutrition ?Etiology: chronic illness ?Signs/Symptoms: mild fat depletion, mild muscle depletion ?Interventions: Boost Breeze, MVI, Liberalize Diet ? ?Chronic indwelling Foley catheter ?Foley catheter replaced on 05/19/2021. ? ?Weakness ?Improved.  Continue PT/OT at SNF. ? ?Constipation ?Resolved. ?-Continue bowel regimen ? ?History of CVA (cerebrovascular accident) ?Stable.  Continue home meds and PT/OT ? ?Essential  hypertension ?Patient was hypotensive on presentation.  Now normotensive. ?-Continue home lisinopril ? ? ?Pressure skin injury ?Pressure Injury 05/10/21 Buttocks Left Stage 2 -  Partial thickness loss of dermis presenting as a shallow open injury with a red, pink wound bed without slough. (Active)  ?05/10/21 1909  ?Location: Buttocks  ?Location Orientation: Left  ?Staging: Stage 2 -  Partial thickness loss of dermis presenting as a shallow open injury with a red, pink wound bed without slough.  ?Wound Description (Comments):   ?Present on Admission: Yes  ?Dressing Type Foam - Lift dressing to assess site every shift 05/22/21 2220  ?   ?Pressure Injury 05/10/21 Buttocks Right Stage 2 -  Partial thickness loss of dermis presenting as a shallow open injury with a red, pink wound bed without slough. (Active)  ?05/10/21 1910  ?Location: Buttocks  ?Location Orientation: Right  ?Staging: Stage 2 -  Partial thickness loss of dermis presenting as a shallow open injury with a red, pink wound bed without slough.  ?Wound Description (Comments):   ?Present on Admission: Yes  ?Dressing Type Foam - Lift dressing to assess site every shift 05/22/21 2220  ? ? ?Vital signs ?Vitals:  ? 05/23/21 0500 05/23/21 0544 05/23/21 0809 05/23/21 1248  ?BP:  (!) 141/80 105/84 103/61  ?Pulse:  67  66  ?Temp:  98 ?F (36.7 ?C)  98.6 ?F (37 ?C)  ?Resp:  16  16  ?Height:      ?Weight: 89.5 kg     ?SpO2:  99%  99%  ?TempSrc:  Oral  Oral  ?BMI (Calculated): 27.53     ?  ? ?Discharge exam ? ?GENERAL: No apparent distress.  Nontoxic. ?HEENT: MMM.  Vision and hearing grossly intact.  ?NECK: Supple.  No apparent JVD.  ?RESP:  No IWOB.  Fair aeration bilaterally. ?CVS:  RRR. Heart sounds normal.  ?ABD/GI/GU: BS+. Abd soft, NTND.  Indwelling Foley catheter in place. ?MSK/EXT:  Moves extremities. No apparent deformity. No edema.  ?SKIN: See above ?NEURO: Awake and alert.  Oriented to self, person and place.  No apparent focal neuro deficit. ?PSYCH: Calm.  Normal affect.  ? ?Discharge Instructions ?Discharge Instructions   ? ? Diet general   Complete by: As directed ?  ? Discharge wound care:   Complete by: As directed ?  ? Foam dressing to offload pressure/weight and frequent turning  ? Increase activity slowly   Complete by: As directed ?  ? ?  ? ?Allergies as of 05/23/2021   ?No Known Allergies ?  ? ?  ?Medication List  ?  ? ?STOP taking these medications   ? ?meclizine 12.5 MG tablet ?Commonly known as: ANTIVERT ?  ? ?  ? ?TAKE these medications   ? ?amoxicillin 500 MG capsule ?Commonly known as: AMOXIL ?Take 1 capsule (500 mg total) by mouth every 8 (eight) hours for 4 days. ?  ?atorvastatin 10 MG tablet ?Commonly known as: LIPITOR ?Take 10 mg by mouth every evening. ?  ?brimonidine 0.2 % ophthalmic solution ?Commonly known as: ALPHAGAN ?Place 1 drop into the right eye 2 (two) times daily. ?  ?celecoxib 200 MG capsule ?Commonly known as: CeleBREX ?Take 1 capsule (200 mg total) by mouth 2 (two) times daily. ?  ?clopidogrel 75 MG tablet ?Commonly known as: PLAVIX ?TAKE 1 TABLET BY MOUTH EVERY DAY ?  ?diclofenac Sodium 1 % Gel ?Commonly known as: VOLTAREN ?Apply 4 g topically 4 (four) times daily. ?  ?dorzolamide-timolol 22.3-6.8 MG/ML ophthalmic solution ?Commonly known as: COSOPT ?Place 1 drop into the  right eye 2 (two) times daily. ?  ?lisinopril 20 MG tablet ?Commonly known as: ZESTRIL ?Take 20 mg by mouth daily. ?  ?melatonin 3 MG Tabs tablet ?Take 1 tablet (3 mg total) by mouth at bedtime as needed. ?  ?Metamucil Smooth Texture 58.6 % powder ?Generic drug: psyllium ?Take 1 packet by mouth daily. ?  ?multivitamin with minerals Tabs tablet ?Take 1 tablet by mouth daily. ?  ?polyethylene glycol powder 17 GM/SCOOP powder ?Commonly known as: GaviLAX ?TAKE 1/2 TO A WHOLE CAPFUL MIXED IN 8 OZ FLUID AS NEEDED FOR CONSTIPATION, HOLD FOR DIARRHEA. ?What changed:  ?how much to take ?how to take this ?when to take this ?reasons to take this ?additional instructions ?   ?senna-docusate 8.6-50 MG tablet ?Commonly known as: Senokot-S ?Take 1 tablet by mouth 2 (two) times daily. ?  ?tamsulosin 0.4 MG Caps capsule ?Commonly known as: FLOMAX ?Take 1 capsule (0.4 mg total) by mouth daily.

## 2021-05-23 NOTE — Assessment & Plan Note (Signed)
As evidenced by poor p.o. intake and significant muscle mass and subcu fat loss. ?Nutrition Problem: Moderate Malnutrition ?Etiology: chronic illness ?Signs/Symptoms: mild fat depletion, mild muscle depletion ?Interventions: Boost Breeze, MVI, Liberalize Diet ?

## 2021-05-23 NOTE — Plan of Care (Signed)
Report called to Evan Morales, Therapist, sports at [facility]. PTAR scheduled by CM. I checked in with PTAR at 1600.  ? ? ?Problem: Education: ?Goal: Knowledge of General Education information will improve ?Description: Including pain rating scale, medication(s)/side effects and non-pharmacologic comfort measures ?Outcome: Completed/Met ?  ?Problem: Health Behavior/Discharge Planning: ?Goal: Ability to manage health-related needs will improve ?Outcome: Completed/Met ?  ?Problem: Clinical Measurements: ?Goal: Ability to maintain clinical measurements within normal limits will improve ?Outcome: Completed/Met ?Goal: Will remain free from infection ?Outcome: Completed/Met ?Goal: Diagnostic test results will improve ?Outcome: Completed/Met ?Goal: Respiratory complications will improve ?Outcome: Completed/Met ?Goal: Cardiovascular complication will be avoided ?Outcome: Completed/Met ?  ?Problem: Activity: ?Goal: Risk for activity intolerance will decrease ?Outcome: Completed/Met ?  ?Problem: Nutrition: ?Goal: Adequate nutrition will be maintained ?Outcome: Completed/Met ?  ?Problem: Coping: ?Goal: Level of anxiety will decrease ?Outcome: Completed/Met ?  ?Problem: Elimination: ?Goal: Will not experience complications related to bowel motility ?Outcome: Completed/Met ?Goal: Will not experience complications related to urinary retention ?Outcome: Completed/Met ?  ?Problem: Pain Managment: ?Goal: General experience of comfort will improve ?Outcome: Completed/Met ?  ?Problem: Safety: ?Goal: Ability to remain free from injury will improve ?Outcome: Completed/Met ?  ?Problem: Skin Integrity: ?Goal: Risk for impaired skin integrity will decrease ?Outcome: Completed/Met ?  ?Problem: Education: ?Goal: Knowledge of condition and prescribed therapy will improve ?Outcome: Completed/Met ?  ?Problem: Cardiac: ?Goal: Will achieve and/or maintain adequate cardiac output ?Outcome: Completed/Met ?  ?Problem: Physical Regulation: ?Goal: Complications related  to the disease process, condition or treatment will be avoided or minimized ?Outcome: Completed/Met ?  ?

## 2021-05-23 NOTE — Assessment & Plan Note (Signed)
Urine culture with pansensitive Proteus mirabilis except to [place].  Foley catheter replaced on 4/22.  Received IV ceftriaxone in house.  Discharged on p.o. amoxicillin through 05/27/2021 to complete 7 days course. ?

## 2021-05-23 NOTE — Assessment & Plan Note (Signed)
Recent Labs  ?  05/12/21 ?0530 05/13/21 ?0502 05/14/21 ?0453 05/14/21 ?1559 05/16/21 ?4712 05/19/21 ?0759 05/20/21 ?5271 05/21/21 ?0408 05/22/21 ?2929 05/23/21 ?0507  ?BUN 20 28* 33* 33* 25* 22 29* 31* 16 13  ?CREATININE 0.68 0.63 0.75 0.51* 0.73 0.71 1.11 0.68 0.46* 0.54*  ?Resolved. ?-Recheck renal function in 1 to 2 weeks ? ?

## 2021-05-23 NOTE — Assessment & Plan Note (Signed)
Multifactorial including dehydration, infection, possible delirium and underlying dementia.  Seems to have resolved.  He is oriented to self, person and place but not time.  Basic encephalopathy work-up including TSH, B12 and ammonia unrevealing. ?-Reorientation and delirium precautions ?

## 2021-05-23 NOTE — TOC Transition Note (Addendum)
Transition of Care (TOC) - CM/SW Discharge Note ? ? ?Patient Details  ?Name: Evan Morales ?MRN: [id number] ?Date of Birth: 02-Aug-1945 ? ?Transition of Care [facility]) CM/SW Contact:  ?Dessa Phi, RN ?Phone Number: ?05/23/2021, 11:30 AM ? ? ?Clinical Narrative:  d/c today to [facility] spoke to DON Norma-going to rm#[id number],nsg report tel# [phone](call report # [phone])-PTAR for 5p pick up sharp.Nsg aware to call PTAR after 5p to ensure timely pick up. No further CM needs.  ?-1:35p-[facility] rep Aud[facility] can accept sooner-Nsg aware to call report.PTAR called for p/u. No further CM needs. ? ? ? ?Final next level of care: [place] ?Barriers to Discharge: No Barriers Identified ? ? ?Patient Goals and CMS Choice ?Patient states their goals for this hospitalization and ongoing recovery are:: Rehab ?CMS Medicare.gov Compare Post Acute Care list provided to:: Patient Represenative (must comment) (spouse [name]) ?Choice offered to / list presented to : Spouse ? ?Discharge Placement ?  ?           ?Patient chooses bed at: [facility] ?Patient to be transferred to facility by: PTAR ?Name of family member notified: Marie(spouse)Patrick(son) ?Patient and family notified of of transfer: 05/23/21 ? ?Discharge Plan and Services ?  ?Discharge Planning Services: CM Consult ?Post Acute Care Choice: [place]          ?  ?  ?  ?  ?  ?  ?  ?  ?  ?  ? ?Social Determinants of Health (SDOH) Interventions ?  ? ? ?Readmission Risk Interventions ?   ? View : No data to display.  ?  ?  ?  ? ? ? ? ? ?

## 2021-05-23 NOTE — Hospital Course (Signed)
76 year old M with PMH of vascular dementia, HTN, HLD, glaucoma and chronic Foley catheter brought to ED from SNF due to syncope and hypotension to 60/40, and admitted for syncope, metabolic encephalopathy, hypotension, catheter associated UTI, dehydration and constipation.  Patient was started on IV fluid for dehydration and AKI.  He was also started on IV ceftriaxone for UTI.  Foley catheter exchanged.  Urine culture grew pansensitive Proteus mirabilis.  Antibiotics de-escalated to p.o. amoxicillin.  Encephalopathy resolved with treatment of dehydration and UTI.  Other encephalopathy work-ups including TSH, B12, ammonia and cortisol level unrevealing.  Therapy recommended return to SNF.  He is discharged on p.o. amoxicillin to complete treatment course through 05/27/2021 ?

## 2021-05-23 NOTE — Assessment & Plan Note (Signed)
Improved.  Continue PT/OT at SNF. ?

## 2021-05-23 NOTE — Progress Notes (Signed)
Physical Therapy Treatment ?Patient Details ?Name: Evan Morales ?MRN: [id number] ?DOB: 08/19/1945 ?Today's Date: 05/23/2021 ? ? ?History of Present Illness Pt is 76 yo male admitted 05/10/21 from SNF after syncopal episode (wife reports poor oral intake).  Pt also pulled out foley catheter with gross hematuria. Pt with multiple recent hospital visits 4/5 in ED and sent home then 4/9 in ED , seen by PT and went SNF from ER. Pt has hx of HLD, glaucoma, HTN, CVA, and likely vascular dementia per H and P. ? ?  ?PT Comments  ? ? +2 max assist for supine to sit, +2 mod/max assist for sit to stand with RW from elevated bed x 3 trials, pt unable to weight shift to advance LEs to ambulate, attempted side stepping at edge of bed, he was unable to perform this either. Pt somewhat lethargic, eyes closed much of session but did open eyes with verbal/tactile stimuli.  ?   ?Recommendations for follow up therapy are one component of a multi-disciplinary discharge planning process, led by the attending physician.  Recommendations may be updated based on patient status, additional functional criteria and insurance authorization. ? ?Follow Up Recommendations ? Skilled nursing-short term rehab (<3 hours/day) ?  ?  ?Assistance Recommended at Discharge Frequent or constant Supervision/Assistance  ?Patient can return home with the following Two people to help with walking and/or transfers;Two people to help with bathing/dressing/bathroom;Assistance with cooking/housework;Assistance with feeding;Direct supervision/assist for medications management;Direct supervision/assist for financial management ?  ?Equipment Recommendations ? BSC/3in1;Wheelchair (measurements PT);Wheelchair cushion (measurements PT);Hospital bed  ?  ?Recommendations for Other Services   ? ? ?  ?Precautions / Restrictions Precautions ?Precautions: Fall ?Restrictions ?Weight Bearing Restrictions: No  ?  ? ?Mobility ? Bed Mobility ?Overal bed mobility: Needs  Assistance ?Bed Mobility: Supine to Sit ?  ?  ?Supine to sit: Max assist, +2 for safety/equipment, +2 for physical assistance, HOB elevated ?Sit to supine: +2 for physical assistance, Total assist ?  ?General bed mobility comments: +2 Max Assist with bed pad to pivot hips and bring LE's off EOB and raise trunk. Max/Total +2 to return to supine and boost in bed at EOS. ?  ? ?Transfers ?Overall transfer level: Needs assistance ?Equipment used: Rolling walker (2 wheels) ?Transfers: Sit to/from Stand ?Sit to Stand: Mod assist, +2 physical assistance, +2 safety/equipment, From elevated surface, Max assist ?  ?  ?  ?  ?  ?General transfer comment: mod/max A to power up from elevated bed, sit to stand x 3 trials, attempted marching in place but pt unable, attempted side stepping and pt had great difficulty advancing RLE, he did wiggle his R foot on the floor to advance it a couple inches ?  ? ?Ambulation/Gait ?  ?  ?  ?  ?  ?  ?  ?General Gait Details: unable to initiate R side step ? ? ?Stairs ?  ?  ?  ?  ?  ? ? ?Wheelchair Mobility ?  ? ?Modified Rankin (Stroke Patients Only) ?  ? ? ?  ?Balance Overall balance assessment: Needs assistance ?Sitting-balance support: Feet supported, Bilateral upper extremity supported ?Sitting balance-Leahy Scale: Poor ?Sitting balance - Comments: posterior lean requiring mod A when he didn't have BUE support. With BUE support he could maintain trunk in neutral ?  ?Standing balance support: Bilateral upper extremity supported ?Standing balance-Leahy Scale: Poor ?Standing balance comment: Mod +2 with RW ?  ?  ?  ?  ?  ?  ?  ?  ?  ?  ?  ?  ? ?  ?  Cognition Arousal/Alertness: Awake/alert ?Behavior During Therapy: Flat affect ?Overall Cognitive Status: Impaired/Different from baseline ?Area of Impairment: Memory, Following commands, Problem solving, Orientation, Attention, Safety/judgement, Awareness ?  ?  ?  ?  ?  ?  ?  ?  ?  ?Current Attention Level: Sustained ?Memory: Decreased short-term  memory, Decreased recall of precautions ?Following Commands: Follows one step commands with increased time, Follows one step commands consistently, Follows multi-step commands inconsistently, Follows multi-step commands with increased time ?  ?Awareness: Emergent ?  ?General Comments: pt drowsy with eyes closed but alerted to voices and more alert with mobility ?  ?  ? ?  ?Exercises General Exercises - Lower Extremity ?Ankle Circles/Pumps: AROM, Both, 10 reps, Supine ?Heel Slides: AAROM, Both, 10 reps, Supine ? ?  ?General Comments   ?  ?  ? ?Pertinent Vitals/Pain Pain Assessment ?Faces Pain Scale: No hurt ?Breathing: normal ?Negative Vocalization: none ?Facial Expression: smiling or inexpressive ?Body Language: relaxed ?Consolability: no need to console ?PAINAD Score: 0  ? ? ?Home Living   ?  ?  ?  ?  ?  ?  ?  ?  ?  ?   ?  ?Prior Function    ?  ?  ?   ? ?PT Goals (current goals can now be found in the care plan section) Acute Rehab PT Goals ?Patient Stated Goal: family agreeable for return to SNF ?PT Goal Formulation: With patient/family ?Time For Goal Achievement: 05/25/21 ?Potential to Achieve Goals: Fair ?Progress towards PT goals: Progressing toward goals ? ?  ?Frequency ? ? ? Min 2X/week ? ? ? ?  ?PT Plan Current plan remains appropriate  ? ? ?Co-evaluation   ?  ?  ?  ?  ? ?  ?AM-PAC PT "6 Clicks" Mobility   ?Outcome Measure ? Help needed turning from your back to your side while in a flat bed without using bedrails?: Total ?Help needed moving from lying on your back to sitting on the side of a flat bed without using bedrails?: Total ?Help needed moving to and from a bed to a chair (including a wheelchair)?: Total ?Help needed standing up from a chair using your arms (e.g., wheelchair or bedside chair)?: Total ?Help needed to walk in hospital room?: Total ?Help needed climbing 3-5 steps with a railing? : Total ?6 Click Score: 6 ? ?  ?End of Session Equipment Utilized During Treatment: Gait belt ?Activity  Tolerance: Patient tolerated treatment well ?Patient left: in bed;with bed alarm set;with call bell/phone within reach;with family/visitor present ?Nurse Communication: Mobility status ?PT Visit Diagnosis: Unsteadiness on feet (R26.81);Muscle weakness (generalized) (M62.81);Difficulty in walking, not elsewhere classified (R26.2) ?  ? ? ?Time: [phone] ?PT Time Calculation (min) (ACUTE ONLY): 19 min ? ?Charges:  $Therapeutic Activity: 8-22 mins          ?          ?Blondell Reveal Kistler PT 05/23/2021  ?Acute Rehabilitation Services ?Pager [phone] ?Office [phone] ? ? ?

## 2021-05-23 NOTE — Assessment & Plan Note (Signed)
Resolved.   ?-Continue bowel regimen ?

## 2021-05-24 NOTE — Telephone Encounter (Signed)
Looks like pt was discharged to SNF yesterday.  ?Can we call pt for update on how he's doing, recommend hosp f/u OV after discharged from SNF.  ?

## 2021-05-24 NOTE — Telephone Encounter (Signed)
Lvm asking pt to call back.  Need an update on pt since being discharged from nursing facility.  Also, pt needs hosp f/u OV scheduled.  ?

## 2021-05-25 DIAGNOSIS — Z8673 Personal history of transient ischemic attack (TIA), and cerebral infarction without residual deficits: Secondary | ICD-10-CM | POA: Diagnosis not present

## 2021-05-25 DIAGNOSIS — I10 Essential (primary) hypertension: Secondary | ICD-10-CM | POA: Diagnosis not present

## 2021-05-25 DIAGNOSIS — F01B0 Vascular dementia, moderate, without behavioral disturbance, psychotic disturbance, mood disturbance, and anxiety: Secondary | ICD-10-CM | POA: Diagnosis not present

## 2021-05-25 DIAGNOSIS — N390 Urinary tract infection, site not specified: Secondary | ICD-10-CM | POA: Diagnosis not present

## 2021-05-25 DIAGNOSIS — R55 Syncope and collapse: Secondary | ICD-10-CM | POA: Diagnosis not present

## 2021-05-25 NOTE — Telephone Encounter (Signed)
Pt spouse missed a call from the nurse and was calling back. She states she will call us back when she finds out when he will be discharged, and will schedule the hospital f/u.  ?

## 2021-05-25 NOTE — Telephone Encounter (Signed)
Lvm asking pt to call back.  Need an update on pt since being discharged from nursing facility.  Also, pt needs hosp f/u OV scheduled.  ?

## 2021-05-28 ENCOUNTER — Inpatient Hospital Stay (HOSPITAL_COMMUNITY)
Admission: EM | Admit: 2021-05-28 | Discharge: 2021-05-31 | DRG: 887 | Disposition: A | Discharge status: Skilled Nursing Facility | Payer: PPO | Source: Skilled Nursing Facility | Attending: Internal Medicine | Admitting: Internal Medicine

## 2021-05-28 ENCOUNTER — Other Ambulatory Visit: Payer: Self-pay

## 2021-05-28 ENCOUNTER — Telehealth: Payer: Self-pay

## 2021-05-28 ENCOUNTER — Emergency Department (HOSPITAL_COMMUNITY): Payer: PPO

## 2021-05-28 ENCOUNTER — Encounter (HOSPITAL_COMMUNITY): Payer: Self-pay

## 2021-05-28 ENCOUNTER — Ambulatory Visit: Payer: PPO | Admitting: Family Medicine

## 2021-05-28 ENCOUNTER — Observation Stay (HOSPITAL_COMMUNITY): Payer: PPO

## 2021-05-28 DIAGNOSIS — Z7902 Long term (current) use of antithrombotics/antiplatelets: Secondary | ICD-10-CM | POA: Diagnosis not present

## 2021-05-28 DIAGNOSIS — E785 Hyperlipidemia, unspecified: Secondary | ICD-10-CM | POA: Diagnosis not present

## 2021-05-28 DIAGNOSIS — F01518 Vascular dementia, unspecified severity, with other behavioral disturbance: Secondary | ICD-10-CM | POA: Diagnosis present

## 2021-05-28 DIAGNOSIS — R279 Unspecified lack of coordination: Secondary | ICD-10-CM | POA: Diagnosis not present

## 2021-05-28 DIAGNOSIS — M6281 Muscle weakness (generalized): Secondary | ICD-10-CM | POA: Diagnosis not present

## 2021-05-28 DIAGNOSIS — Z79899 Other long term (current) drug therapy: Secondary | ICD-10-CM | POA: Diagnosis not present

## 2021-05-28 DIAGNOSIS — Z66 Do not resuscitate: Secondary | ICD-10-CM | POA: Diagnosis not present

## 2021-05-28 DIAGNOSIS — Z2239 Carrier of other specified bacterial diseases: Secondary | ICD-10-CM | POA: Diagnosis not present

## 2021-05-28 DIAGNOSIS — G4710 Hypersomnia, unspecified: Secondary | ICD-10-CM | POA: Diagnosis not present

## 2021-05-28 DIAGNOSIS — R5381 Other malaise: Secondary | ICD-10-CM | POA: Diagnosis not present

## 2021-05-28 DIAGNOSIS — E876 Hypokalemia: Secondary | ICD-10-CM | POA: Diagnosis not present

## 2021-05-28 DIAGNOSIS — R1312 Dysphagia, oropharyngeal phase: Secondary | ICD-10-CM | POA: Diagnosis not present

## 2021-05-28 DIAGNOSIS — Z978 Presence of other specified devices: Secondary | ICD-10-CM | POA: Diagnosis not present

## 2021-05-28 DIAGNOSIS — E878 Other disorders of electrolyte and fluid balance, not elsewhere classified: Secondary | ICD-10-CM | POA: Diagnosis present

## 2021-05-28 DIAGNOSIS — T38895A Adverse effect of other hormones and synthetic substitutes, initial encounter: Secondary | ICD-10-CM | POA: Diagnosis not present

## 2021-05-28 DIAGNOSIS — R55 Syncope and collapse: Secondary | ICD-10-CM | POA: Diagnosis not present

## 2021-05-28 DIAGNOSIS — Z96651 Presence of right artificial knee joint: Secondary | ICD-10-CM | POA: Diagnosis present

## 2021-05-28 DIAGNOSIS — Z20822 Contact with and (suspected) exposure to covid-19: Secondary | ICD-10-CM | POA: Diagnosis not present

## 2021-05-28 DIAGNOSIS — Z947 Corneal transplant status: Secondary | ICD-10-CM

## 2021-05-28 DIAGNOSIS — B961 Klebsiella pneumoniae [K. pneumoniae] as the cause of diseases classified elsewhere: Secondary | ICD-10-CM | POA: Diagnosis present

## 2021-05-28 DIAGNOSIS — Z87891 Personal history of nicotine dependence: Secondary | ICD-10-CM | POA: Diagnosis not present

## 2021-05-28 DIAGNOSIS — G9341 Metabolic encephalopathy: Secondary | ICD-10-CM | POA: Diagnosis not present

## 2021-05-28 DIAGNOSIS — F01B0 Vascular dementia, moderate, without behavioral disturbance, psychotic disturbance, mood disturbance, and anxiety: Secondary | ICD-10-CM | POA: Diagnosis not present

## 2021-05-28 DIAGNOSIS — Z743 Need for continuous supervision: Secondary | ICD-10-CM | POA: Diagnosis not present

## 2021-05-28 DIAGNOSIS — L89312 Pressure ulcer of right buttock, stage 2: Secondary | ICD-10-CM | POA: Diagnosis not present

## 2021-05-28 DIAGNOSIS — E440 Moderate protein-calorie malnutrition: Secondary | ICD-10-CM | POA: Diagnosis present

## 2021-05-28 DIAGNOSIS — Z8673 Personal history of transient ischemic attack (TIA), and cerebral infarction without residual deficits: Secondary | ICD-10-CM | POA: Diagnosis not present

## 2021-05-28 DIAGNOSIS — Z820 Family history of epilepsy and other diseases of the nervous system: Secondary | ICD-10-CM

## 2021-05-28 DIAGNOSIS — R079 Chest pain, unspecified: Secondary | ICD-10-CM | POA: Diagnosis not present

## 2021-05-28 DIAGNOSIS — N390 Urinary tract infection, site not specified: Secondary | ICD-10-CM | POA: Diagnosis not present

## 2021-05-28 DIAGNOSIS — R410 Disorientation, unspecified: Secondary | ICD-10-CM | POA: Diagnosis not present

## 2021-05-28 DIAGNOSIS — R531 Weakness: Secondary | ICD-10-CM

## 2021-05-28 DIAGNOSIS — K5909 Other constipation: Secondary | ICD-10-CM | POA: Diagnosis not present

## 2021-05-28 DIAGNOSIS — R4182 Altered mental status, unspecified: Secondary | ICD-10-CM | POA: Diagnosis not present

## 2021-05-28 DIAGNOSIS — Z6827 Body mass index (BMI) 27.0-27.9, adult: Secondary | ICD-10-CM

## 2021-05-28 DIAGNOSIS — I10 Essential (primary) hypertension: Secondary | ICD-10-CM | POA: Diagnosis present

## 2021-05-28 DIAGNOSIS — G9340 Encephalopathy, unspecified: Secondary | ICD-10-CM | POA: Diagnosis not present

## 2021-05-28 DIAGNOSIS — J189 Pneumonia, unspecified organism: Secondary | ICD-10-CM | POA: Diagnosis not present

## 2021-05-28 DIAGNOSIS — M25522 Pain in left elbow: Secondary | ICD-10-CM

## 2021-05-28 DIAGNOSIS — Z83511 Family history of glaucoma: Secondary | ICD-10-CM

## 2021-05-28 DIAGNOSIS — R2689 Other abnormalities of gait and mobility: Secondary | ICD-10-CM | POA: Diagnosis not present

## 2021-05-28 DIAGNOSIS — E8729 Other acidosis: Secondary | ICD-10-CM | POA: Diagnosis not present

## 2021-05-28 DIAGNOSIS — R509 Fever, unspecified: Secondary | ICD-10-CM | POA: Diagnosis not present

## 2021-05-28 DIAGNOSIS — R945 Abnormal results of liver function studies: Secondary | ICD-10-CM | POA: Diagnosis not present

## 2021-05-28 DIAGNOSIS — E782 Mixed hyperlipidemia: Secondary | ICD-10-CM | POA: Diagnosis not present

## 2021-05-28 DIAGNOSIS — M7989 Other specified soft tissue disorders: Secondary | ICD-10-CM | POA: Diagnosis not present

## 2021-05-28 DIAGNOSIS — M719 Bursopathy, unspecified: Secondary | ICD-10-CM | POA: Diagnosis present

## 2021-05-28 DIAGNOSIS — R278 Other lack of coordination: Secondary | ICD-10-CM | POA: Diagnosis not present

## 2021-05-28 DIAGNOSIS — R7401 Elevation of levels of liver transaminase levels: Secondary | ICD-10-CM | POA: Diagnosis present

## 2021-05-28 DIAGNOSIS — H409 Unspecified glaucoma: Secondary | ICD-10-CM | POA: Diagnosis present

## 2021-05-28 DIAGNOSIS — R41841 Cognitive communication deficit: Secondary | ICD-10-CM | POA: Diagnosis not present

## 2021-05-28 DIAGNOSIS — M109 Gout, unspecified: Secondary | ICD-10-CM | POA: Diagnosis not present

## 2021-05-28 DIAGNOSIS — N400 Enlarged prostate without lower urinary tract symptoms: Secondary | ICD-10-CM | POA: Diagnosis not present

## 2021-05-28 DIAGNOSIS — G894 Chronic pain syndrome: Secondary | ICD-10-CM | POA: Diagnosis not present

## 2021-05-28 DIAGNOSIS — R404 Transient alteration of awareness: Secondary | ICD-10-CM | POA: Diagnosis not present

## 2021-05-28 DIAGNOSIS — R7989 Other specified abnormal findings of blood chemistry: Secondary | ICD-10-CM | POA: Diagnosis present

## 2021-05-28 DIAGNOSIS — R748 Abnormal levels of other serum enzymes: Secondary | ICD-10-CM | POA: Diagnosis present

## 2021-05-28 DIAGNOSIS — H4089 Other specified glaucoma: Secondary | ICD-10-CM | POA: Diagnosis not present

## 2021-05-28 DIAGNOSIS — T83511A Infection and inflammatory reaction due to indwelling urethral catheter, initial encounter: Secondary | ICD-10-CM | POA: Diagnosis not present

## 2021-05-28 LAB — COMPREHENSIVE METABOLIC PANEL
ALT: 101 U/L — ABNORMAL HIGH (ref 0–44)
AST: 71 U/L — ABNORMAL HIGH (ref 15–41)
Albumin: 2.2 g/dL — ABNORMAL LOW (ref 3.5–5.0)
Alkaline Phosphatase: 45 U/L (ref 38–126)
Anion gap: 6 (ref 5–15)
BUN: 17 mg/dL (ref 8–23)
CO2: 16 mmol/L — ABNORMAL LOW (ref 22–32)
Calcium: 6 mg/dL — CL (ref 8.9–10.3)
Chloride: 117 mmol/L — ABNORMAL HIGH (ref 98–111)
Creatinine, Ser: 0.43 mg/dL — ABNORMAL LOW (ref 0.61–1.24)
GFR, Estimated: >60 mL/min (ref >60)
Glucose, Bld: 82 mg/dL (ref 70–99)
Potassium: 3 mmol/L — ABNORMAL LOW (ref 3.5–5.1)
Sodium: 139 mmol/L (ref 135–145)
Total Bilirubin: 1.2 mg/dL (ref 0.3–1.2)
Total Protein: 5.3 g/dL — ABNORMAL LOW (ref 6.5–8.1)

## 2021-05-28 LAB — CBC WITH DIFFERENTIAL/PLATELET
Abs Immature Granulocytes: 0.04 10*3/uL (ref 0.00–0.07)
Basophils Absolute: 0 10*3/uL (ref 0.0–0.1)
Basophils Relative: 0 %
Eosinophils Absolute: 0.3 10*3/uL (ref 0.0–0.5)
Eosinophils Relative: 3 %
HCT: 40.5 % (ref 39.0–52.0)
Hemoglobin: 13.6 g/dL (ref 13.0–17.0)
Immature Granulocytes: 0 %
Lymphocytes Relative: 9 %
Lymphs Abs: 0.9 10*3/uL (ref 0.7–4.0)
MCH: 32.8 pg (ref 26.0–34.0)
MCHC: 33.6 g/dL (ref 30.0–36.0)
MCV: 97.6 fL (ref 80.0–100.0)
Monocytes Absolute: 0.8 10*3/uL (ref 0.1–1.0)
Monocytes Relative: 7 %
Neutro Abs: 8.3 10*3/uL — ABNORMAL HIGH (ref 1.7–7.7)
Neutrophils Relative %: 81 %
Platelets: 359 10*3/uL (ref 150–400)
RBC: 4.15 MIL/uL — ABNORMAL LOW (ref 4.22–5.81)
RDW: 13.2 % (ref 11.5–15.5)
WBC: 10.3 10*3/uL (ref 4.0–10.5)
nRBC: 0 % (ref 0.0–0.2)

## 2021-05-28 LAB — URINALYSIS, ROUTINE W REFLEX MICROSCOPIC
Bilirubin Urine: NEGATIVE
Glucose, UA: NEGATIVE mg/dL
Hgb urine dipstick: NEGATIVE
Ketones, ur: NEGATIVE mg/dL
Leukocytes,Ua: NEGATIVE
Nitrite: NEGATIVE
Protein, ur: 30 mg/dL — AB
Specific Gravity, Urine: 1.021 (ref 1.005–1.030)
pH: 5 (ref 5.0–8.0)

## 2021-05-28 LAB — RESP PANEL BY RT-PCR (FLU A&B, COVID) ARPGX2
Influenza A by PCR: NEGATIVE
Influenza B by PCR: NEGATIVE
SARS Coronavirus 2 by RT PCR: NEGATIVE

## 2021-05-28 LAB — MAGNESIUM: Magnesium: 1.9 mg/dL (ref 1.7–2.4)

## 2021-05-28 LAB — SEDIMENTATION RATE: Sed Rate: 98 mm/hr — ABNORMAL HIGH (ref 0–16)

## 2021-05-28 LAB — LACTIC ACID, PLASMA
Lactic Acid, Venous: 1.6 mmol/L (ref 0.5–1.9)
Lactic Acid, Venous: 1.6 mmol/L (ref 0.5–1.9)

## 2021-05-28 LAB — C-REACTIVE PROTEIN: CRP: 12.7 mg/dL — ABNORMAL HIGH (ref <1.0)

## 2021-05-28 LAB — PROCALCITONIN: Procalcitonin: <0.1 ng/mL

## 2021-05-28 LAB — CBG MONITORING, ED: Glucose-Capillary: 101 mg/dL — ABNORMAL HIGH (ref 70–99)

## 2021-05-28 MED ORDER — LACTATED RINGERS IV SOLN: INTRAVENOUS | Status: DC

## 2021-05-28 MED ORDER — TAMSULOSIN HCL 0.4 MG PO CAPS
0.4000 mg | ORAL_CAPSULE | Freq: Every day | ORAL | Status: DC
Start: 2021-05-29 — End: 2021-05-31
  Administered 2021-05-29 – 2021-05-30 (×2): 0.4 mg via ORAL
  Filled 2021-05-28 (×2): qty 1

## 2021-05-28 MED ORDER — ONDANSETRON HCL 4 MG PO TABS: 4.0000 mg | ORAL_TABLET | Freq: Four times a day (QID) | ORAL | Status: DC | PRN

## 2021-05-28 MED ORDER — ACETAMINOPHEN 325 MG PO TABS
650.0000 mg | ORAL_TABLET | Freq: Four times a day (QID) | ORAL | Status: DC | PRN
Start: 2021-05-28 — End: 2021-05-31

## 2021-05-28 MED ORDER — ACETAMINOPHEN 650 MG RE SUPP
650.0000 mg | Freq: Four times a day (QID) | RECTAL | Status: DC | PRN
Start: 2021-05-28 — End: 2021-05-31

## 2021-05-28 MED ORDER — POTASSIUM CHLORIDE 10 MEQ/100ML IV SOLN
10.0000 meq | INTRAVENOUS | Status: AC
  Administered 2021-05-28 (×3): 10 meq via INTRAVENOUS
  Filled 2021-05-28 (×3): qty 100

## 2021-05-28 MED ORDER — CEFTRIAXONE SODIUM 1 G IJ SOLR
1.0000 g | Freq: Once | INTRAMUSCULAR | Status: AC
  Administered 2021-05-28: 1 g via INTRAVENOUS
  Filled 2021-05-28: qty 10

## 2021-05-28 MED ORDER — LACTATED RINGERS IV BOLUS
1000.0000 mL | Freq: Once | INTRAVENOUS | Status: AC
Start: 2021-05-28 — End: 2021-05-28
  Administered 2021-05-28: 1000 mL via INTRAVENOUS

## 2021-05-28 MED ORDER — CLOPIDOGREL BISULFATE 75 MG PO TABS
75.0000 mg | ORAL_TABLET | Freq: Every day | ORAL | Status: DC
  Administered 2021-05-29 – 2021-05-30 (×2): 75 mg via ORAL
  Filled 2021-05-28 (×2): qty 1

## 2021-05-28 MED ORDER — ONDANSETRON HCL 4 MG/2ML IJ SOLN: 4.0000 mg | Freq: Four times a day (QID) | INTRAMUSCULAR | Status: DC | PRN

## 2021-05-28 MED ORDER — DORZOLAMIDE HCL-TIMOLOL MAL 2-0.5 % OP SOLN
1.0000 [drp] | Freq: Two times a day (BID) | OPHTHALMIC | Status: DC
  Administered 2021-05-28 – 2021-05-30 (×5): 1 [drp] via OPHTHALMIC
  Filled 2021-05-28: qty 10

## 2021-05-28 MED ORDER — ENOXAPARIN SODIUM 40 MG/0.4ML IJ SOSY
40.0000 mg | PREFILLED_SYRINGE | INTRAMUSCULAR | Status: DC
  Administered 2021-05-28 – 2021-05-30 (×3): 40 mg via SUBCUTANEOUS
  Filled 2021-05-28 (×3): qty 0.4

## 2021-05-28 MED ORDER — POTASSIUM CHLORIDE CRYS ER 20 MEQ PO TBCR
40.0000 meq | EXTENDED_RELEASE_TABLET | Freq: Once | ORAL | Status: AC
  Administered 2021-05-28: 40 meq via ORAL
  Filled 2021-05-28: qty 2

## 2021-05-28 MED ORDER — BRIMONIDINE TARTRATE 0.2 % OP SOLN
1.0000 [drp] | Freq: Two times a day (BID) | OPHTHALMIC | Status: DC
  Administered 2021-05-28 – 2021-05-30 (×5): 1 [drp] via OPHTHALMIC
  Filled 2021-05-28: qty 5

## 2021-05-28 MED ORDER — CALCIUM GLUCONATE-NACL 2-0.675 GM/100ML-% IV SOLN
2.0000 g | Freq: Once | INTRAVENOUS | Status: AC
Start: 2021-05-28 — End: 2021-05-28
  Administered 2021-05-28: 2000 mg via INTRAVENOUS
  Filled 2021-05-28: qty 100
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
## 2021-05-28 NOTE — ED Triage Notes (Signed)
Pt BIB [facility] from [facility]. Wife reports patient being up and active this morning and slowly declining. Upon EMS arrival , patient was lethargic. EMS started an IV and gave 400 NS.  ? ?Vital signs were:  ?116/70 ?76- HR ?98.3 Temp ?22-24 RR ? ? ?

## 2021-05-28 NOTE — ED Notes (Signed)
Patient transported to CT 

## 2021-05-28 NOTE — ED Provider Notes (Signed)
?  Physical Exam  ?BP 125/68   Pulse 74   Temp 98.9 ?F (37.2 ?C) (Rectal)   Resp 14   Ht '5\' 11"'$  (1.803 m)   Wt 89.5 kg   SpO2 96%   BMI 27.52 kg/m?  ? ?Physical Exam ? ?Procedures  ?Marland KitchenCritical Care ?Performed by: Varney Biles, MD ?Authorized by: Varney Biles, MD  ? ?Critical care provider statement:  ?  Critical care time (minutes):  48 ?  Critical care was necessary to treat or prevent imminent or life-threatening deterioration of the following conditions:  CNS failure or compromise and metabolic crisis ?  Critical care was time spent personally by me on the following activities:  Development of treatment plan with patient or surrogate, discussions with consultants, evaluation of patient's response to treatment, examination of patient, ordering and review of laboratory studies, ordering and review of radiographic studies, ordering and performing treatments and interventions, pulse oximetry, re-evaluation of patient's condition and review of old charts ? ?ED Course / MDM  ?  ?Medical Decision Making ?Amount and/or Complexity of Data Reviewed ?Labs: ordered. ?Radiology: ordered. ? ?Risk ?Prescription drug management. ?Decision regarding hospitalization. ? ? ?76 year old male comes in with chief complaint of altered mental status. ? ?Patient was seen by [facility] arrival, initial work-up has been initiated. ?Plan is to follow-up on the results and manage, treat and decide on disposition accordingly. ? ?I have reviewed patient's previous work-up.  Patient was just discharged from the hospital on 4-26.  He was admitted to the hospital because of syncope and hypotension.  Patient was also found to have metabolic encephalopathy at that time and catheter associated UTI -status post antibiotics. ? ?According to the wife, patient has been at his baseline since he was discharged.  The only thing she has noticed is that he is not eating as well.  Someone at the nursing home assessed the patient and felt like he was  more sleepy than usual, and sent him to the emergency room. ? ?Patient is somnolent.  He is arousable, but not answering any of my questions, he is noted to have a left elbow erythema, edema consistent with most likely bursitis. ? ?CT scan of the brain and elbow x-ray visualized independently. ?CT scan negative for any acute bleed.  Chest x-ray, elbow x-ray negative for pneumothorax, fracture.  There is some soft tissue swelling over the left elbow. ? ?Patient's blood work reveals hypokalemia with K of 3 and hypocalcemia with potassium of 3.  Patient also has hyperchloremia, low bicarb.  His albumin is low, therefore suspect that the ionized calcium is slightly higher, yet in the setting of altered mental status -hypocalcemia could be contributing. ? ? Date: 05/28/2021 ? Rate: 65 ? Rhythm: normal sinus rhythm ? QRS Axis: normal ? Intervals: normal ? ST/T Wave abnormalities: normal ? Conduction Disutrbances: none ? Narrative Interpretation: unremarkable ? ?Urine is in process. ?I suspect that there could be infectious bursitis, we will start him on ceftriaxone. ? ? ? ? ? ? ?  ?Varney Biles, MD ?05/28/21 1733 ? ?
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
## 2021-05-28 NOTE — Telephone Encounter (Signed)
Pt's wife called stating he is in [facility] and she does not see any improvement in him. He doesn't want to get up out of bed. Can't move his feet. She does not know what to do. Asked if Dr Darnell Level had any recommendations. ?

## 2021-05-29 DIAGNOSIS — E876 Hypokalemia: Secondary | ICD-10-CM | POA: Diagnosis present

## 2021-05-29 DIAGNOSIS — Z7902 Long term (current) use of antithrombotics/antiplatelets: Secondary | ICD-10-CM | POA: Diagnosis not present

## 2021-05-29 DIAGNOSIS — E782 Mixed hyperlipidemia: Secondary | ICD-10-CM | POA: Diagnosis not present

## 2021-05-29 DIAGNOSIS — Z8673 Personal history of transient ischemic attack (TIA), and cerebral infarction without residual deficits: Secondary | ICD-10-CM | POA: Diagnosis not present

## 2021-05-29 DIAGNOSIS — Z947 Corneal transplant status: Secondary | ICD-10-CM | POA: Diagnosis not present

## 2021-05-29 DIAGNOSIS — T38895A Adverse effect of other hormones and synthetic substitutes, initial encounter: Secondary | ICD-10-CM | POA: Diagnosis present

## 2021-05-29 DIAGNOSIS — E440 Moderate protein-calorie malnutrition: Secondary | ICD-10-CM | POA: Diagnosis not present

## 2021-05-29 DIAGNOSIS — M719 Bursopathy, unspecified: Secondary | ICD-10-CM | POA: Diagnosis present

## 2021-05-29 DIAGNOSIS — N390 Urinary tract infection, site not specified: Secondary | ICD-10-CM | POA: Diagnosis not present

## 2021-05-29 DIAGNOSIS — Z96651 Presence of right artificial knee joint: Secondary | ICD-10-CM | POA: Diagnosis present

## 2021-05-29 DIAGNOSIS — F01518 Vascular dementia, unspecified severity, with other behavioral disturbance: Secondary | ICD-10-CM | POA: Diagnosis present

## 2021-05-29 DIAGNOSIS — B961 Klebsiella pneumoniae [K. pneumoniae] as the cause of diseases classified elsewhere: Secondary | ICD-10-CM | POA: Diagnosis present

## 2021-05-29 DIAGNOSIS — R531 Weakness: Secondary | ICD-10-CM | POA: Diagnosis not present

## 2021-05-29 DIAGNOSIS — E785 Hyperlipidemia, unspecified: Secondary | ICD-10-CM | POA: Diagnosis present

## 2021-05-29 DIAGNOSIS — G4710 Hypersomnia, unspecified: Secondary | ICD-10-CM | POA: Diagnosis present

## 2021-05-29 DIAGNOSIS — Z20822 Contact with and (suspected) exposure to covid-19: Secondary | ICD-10-CM | POA: Diagnosis present

## 2021-05-29 DIAGNOSIS — Z79899 Other long term (current) drug therapy: Secondary | ICD-10-CM | POA: Diagnosis not present

## 2021-05-29 DIAGNOSIS — Z87891 Personal history of nicotine dependence: Secondary | ICD-10-CM | POA: Diagnosis not present

## 2021-05-29 DIAGNOSIS — Z66 Do not resuscitate: Secondary | ICD-10-CM | POA: Diagnosis present

## 2021-05-29 DIAGNOSIS — N400 Enlarged prostate without lower urinary tract symptoms: Secondary | ICD-10-CM | POA: Diagnosis not present

## 2021-05-29 DIAGNOSIS — R7401 Elevation of levels of liver transaminase levels: Secondary | ICD-10-CM | POA: Diagnosis present

## 2021-05-29 DIAGNOSIS — H4089 Other specified glaucoma: Secondary | ICD-10-CM | POA: Diagnosis not present

## 2021-05-29 DIAGNOSIS — R5381 Other malaise: Secondary | ICD-10-CM | POA: Diagnosis present

## 2021-05-29 DIAGNOSIS — E878 Other disorders of electrolyte and fluid balance, not elsewhere classified: Secondary | ICD-10-CM | POA: Diagnosis present

## 2021-05-29 DIAGNOSIS — Z2239 Carrier of other specified bacterial diseases: Secondary | ICD-10-CM | POA: Diagnosis not present

## 2021-05-29 DIAGNOSIS — M109 Gout, unspecified: Secondary | ICD-10-CM | POA: Diagnosis present

## 2021-05-29 DIAGNOSIS — L89312 Pressure ulcer of right buttock, stage 2: Secondary | ICD-10-CM | POA: Diagnosis present

## 2021-05-29 DIAGNOSIS — R55 Syncope and collapse: Secondary | ICD-10-CM | POA: Diagnosis not present

## 2021-05-29 DIAGNOSIS — Z978 Presence of other specified devices: Secondary | ICD-10-CM | POA: Diagnosis not present

## 2021-05-29 DIAGNOSIS — I10 Essential (primary) hypertension: Secondary | ICD-10-CM | POA: Diagnosis present

## 2021-05-29 DIAGNOSIS — T83511A Infection and inflammatory reaction due to indwelling urethral catheter, initial encounter: Secondary | ICD-10-CM | POA: Diagnosis not present

## 2021-05-29 DIAGNOSIS — G894 Chronic pain syndrome: Secondary | ICD-10-CM | POA: Diagnosis not present

## 2021-05-29 DIAGNOSIS — K5909 Other constipation: Secondary | ICD-10-CM | POA: Diagnosis not present

## 2021-05-29 DIAGNOSIS — E8729 Other acidosis: Secondary | ICD-10-CM | POA: Diagnosis present

## 2021-05-29 DIAGNOSIS — M25522 Pain in left elbow: Secondary | ICD-10-CM | POA: Diagnosis not present

## 2021-05-29 DIAGNOSIS — F01B0 Vascular dementia, moderate, without behavioral disturbance, psychotic disturbance, mood disturbance, and anxiety: Secondary | ICD-10-CM | POA: Diagnosis not present

## 2021-05-29 LAB — COMPREHENSIVE METABOLIC PANEL
ALT: 128 U/L — ABNORMAL HIGH (ref 0–44)
AST: 79 U/L — ABNORMAL HIGH (ref 15–41)
Albumin: 2.6 g/dL — ABNORMAL LOW (ref 3.5–5.0)
Alkaline Phosphatase: 59 U/L (ref 38–126)
Anion gap: 7 (ref 5–15)
BUN: 21 mg/dL (ref 8–23)
CO2: 20 mmol/L — ABNORMAL LOW (ref 22–32)
Calcium: 8.6 mg/dL — ABNORMAL LOW (ref 8.9–10.3)
Chloride: 113 mmol/L — ABNORMAL HIGH (ref 98–111)
Creatinine, Ser: 0.68 mg/dL (ref 0.61–1.24)
GFR, Estimated: >60 mL/min (ref >60)
Glucose, Bld: 94 mg/dL (ref 70–99)
Potassium: 4 mmol/L (ref 3.5–5.1)
Sodium: 140 mmol/L (ref 135–145)
Total Bilirubin: 1 mg/dL (ref 0.3–1.2)
Total Protein: 6.3 g/dL — ABNORMAL LOW (ref 6.5–8.1)

## 2021-05-29 LAB — CBC
HCT: 36.4 % — ABNORMAL LOW (ref 39.0–52.0)
Hemoglobin: 12 g/dL — ABNORMAL LOW (ref 13.0–17.0)
MCH: 32 pg (ref 26.0–34.0)
MCHC: 33 g/dL (ref 30.0–36.0)
MCV: 97.1 fL (ref 80.0–100.0)
Platelets: 300 10*3/uL (ref 150–400)
RBC: 3.75 MIL/uL — ABNORMAL LOW (ref 4.22–5.81)
RDW: 13.1 % (ref 11.5–15.5)
WBC: 10.1 10*3/uL (ref 4.0–10.5)
nRBC: 0 % (ref 0.0–0.2)

## 2021-05-29 LAB — MRSA NEXT GEN BY PCR, NASAL: MRSA by PCR Next Gen: NOT DETECTED

## 2021-05-29 MED ORDER — LACTATED RINGERS IV SOLN: INTRAVENOUS | Status: AC

## 2021-05-29 MED ORDER — CHLORHEXIDINE GLUCONATE CLOTH 2 % EX PADS
6.0000 | MEDICATED_PAD | Freq: Every day | CUTANEOUS | Status: DC
  Administered 2021-05-29 – 2021-05-30 (×2): 6 via TOPICAL

## 2021-05-29 MED ORDER — ORAL CARE MOUTH RINSE
15.0000 mL | Freq: Two times a day (BID) | OROMUCOSAL | Status: DC
  Administered 2021-05-29 – 2021-05-30 (×4): 15 mL via OROMUCOSAL

## 2021-05-29 NOTE — Plan of Care (Signed)

## 2021-05-29 NOTE — Progress Notes (Signed)
[facility] ? ?PROGRESS NOTE ? ?Evan Morales WLN:[id number] DOB: 1945/05/11 DOA: 05/28/2021 ?PCP: Ria Bush, MD ? ? ?Brief HPI:   ? 76 y.o. male, with history of vascular dementia, hypertension, hyperlipidemia, gout,, chronic Foley catheter who was recently admitted to the hospital from 05/10/2021 to 05/23/2021 for syncope, hypotension with blood pressure 74/08, metabolic encephalopathy, hypotension, catheter associated UTI, dehydration, constipation.  At that time he was started on IV fluids for dehydration and acute kidney injury.  He was also started on IV ceftriaxone for UTI.  Foley catheter was exchanged and patient was sent back to the skilled facility. ?  ?As per facility patient was very somnolent, he was evaluated by provider over there and recommended to go to the hospital for further evaluation.  History is very limited as patient is very somnolent but easily arousable.  He denies any complaints.  ?  ?In the ED patient was found to have hypokalemia, hypocalcemia, chest x-ray showed possibility of pneumonia versus interstitial edema.  Lactic acid is 1.6.  WBC 10.3. ?  ?No history of nausea vomiting or diarrhea ?No history of shortness of breath chest pain ?Patient has Foley catheter in place and draining well ? ? ? ?Subjective  ? ?Patient seen and examined, he is more alert today.  Communicating. ? ? Assessment/Plan:  ? ?Hypersomnolence ?-Resolved ?-Likely medication induced, melatonin stopped ?-UA is clear, chest x-ray shows possibility of  pneumonia, procalcitonin less than 0.10; completed 5 days of antibiotics post discharge on 05/27/2021 ?-CT head negative ?-Follow urine and blood culture results ?-We will not prescribe melatonin on discharge ? ?Catheter associated UTI ?-Patient completed antibiotics on 05/27/2021 ? ?Transaminitis ?-LFTs are elevated AST 79, ALT 128 ?-Abdominal ultrasound showed no acute abnormality ?-Lipitor on hold ? ?Hyperchloremic metabolic acidosis ?-Improving on LR  at 75 mL/h ?-Continue IV fluids for 24 more hours ? ?Malnutrition of moderate degree ?-Dietitian consulted ? ?Hypokalemia ?-Replete ? ?Hypocalcemia ?-Calcium improved to 8.6 today ? ?Hypertension ?-Blood pressure is soft, continue to hold lisinopril ? ?Elbow swelling/bursitis ?-Supportive care, if no improvement consider orthopedic evaluation ? ? ?Medications ? ?  ? brimonidine  1 drop Right Eye BID  ? clopidogrel  75 mg Oral Daily  ? dorzolamide-timolol  1 drop Right Eye BID  ? enoxaparin (LOVENOX) injection  40 mg Subcutaneous Q24H  ? mouth rinse  15 mL Mouth Rinse BID  ? tamsulosin  0.4 mg Oral Daily  ? ? ? Data Reviewed:  ? ?CBG: ? ?Recent Labs  ?Lab 05/23/21 ?0617 05/28/21 ?1404  ?GLUCAP 108* 101*  ? ? ?SpO2: 96 %  ? ? ?Vitals:  ? 05/28/21 1850 05/28/21 2257 05/29/21 0345 05/29/21 0820  ?BP: 125/76 108/79 (!) 109/58 118/80  ?Pulse: 69 76 64 75  ?Resp: '16 15  18  '$ ?Temp: 98.1 ?F (36.7 ?C) 98 ?F (36.7 ?C) 99.2 ?F (37.3 ?C) 98.4 ?F (36.9 ?C)  ?TempSrc: Oral Oral Axillary Oral  ?SpO2: 97% 100% 96% 96%  ?Weight:      ?Height:      ? ? ? ? ?Data Reviewed: ? ?Basic Metabolic Panel: ?Recent Labs  ?Lab 05/23/21 ?0507 05/28/21 ?1500 05/29/21 ?0422  ?NA 140 139 140  ?K 3.7 3.0* 4.0  ?CL 113* 117* 113*  ?CO2 22 16* 20*  ?GLUCOSE 103* 82 94  ?BUN '13 17 21  '$ ?CREATININE 0.54* 0.43* 0.68  ?CALCIUM 8.5* 6.0* 8.6*  ?MG  --  1.9  --   ? ? ?CBC: ?Recent Labs  ?Lab 05/23/21 ?0507 05/28/21 ?  1422 05/29/21 ?0422  ?WBC 12.2* 10.3 10.1  ?NEUTROABS  --  8.3*  --   ?HGB 12.2* 13.6 12.0*  ?HCT 36.9* 40.5 36.4*  ?MCV 97.9 97.6 97.1  ?PLT 280 359 300  ? ? ?LFT ?Recent Labs  ?Lab 05/28/21 ?1500 05/29/21 ?0422  ?AST 71* 79*  ?ALT 101* 128*  ?ALKPHOS 45 59  ?BILITOT 1.2 1.0  ?PROT 5.3* 6.3*  ?ALBUMIN 2.2* 2.6*  ? ?  ?Antibiotics: ?Anti-infectives (From admission, onward)  ? ? Start     Dose/Rate Route Frequency Ordered Stop  ? 05/28/21 1745  cefTRIAXone (ROCEPHIN) 1 g in sodium chloride 0.9 % 100 mL IVPB       ? 1 g ?200 mL/hr over 30 Minutes  Intravenous  Once 05/28/21 1733 05/28/21 1930  ? ?  ? ? ? ?DVT prophylaxis: Lovenox ? ?Code Status: Full code ? ?Family Communication: No family at bedside ? ? ?CONSULTS  ? ? ?Objective  ? ? ?Physical Examination: ? ? ?General-appears in no acute distress ?Heart-S1-S2, regular, no murmur auscultated ?Lungs-clear to auscultation bilaterally, no wheezing or crackles auscultated ?Abdomen-soft, nontender, no organomegaly ?Extremities-no edema in the lower extremities ?Neuro-alert, oriented x2, following commands ? ? ?Status is: Inpatient: Hypersomnolence ? ? ? ?Pressure Injury 05/10/21 Buttocks Left Stage 2 -  Partial thickness loss of dermis presenting as a shallow open injury with a red, pink wound bed without slough. (Active)  ?05/10/21 1909  ?Location: Buttocks  ?Location Orientation: Left  ?Staging: Stage 2 -  Partial thickness loss of dermis presenting as a shallow open injury with a red, pink wound bed without slough.  ?Wound Description (Comments):   ?Present on Admission: Yes  ?   ?Pressure Injury 05/10/21 Buttocks Right Stage 2 -  Partial thickness loss of dermis presenting as a shallow open injury with a red, pink wound bed without slough. (Active)  ?05/10/21 1910  ?Location: Buttocks  ?Location Orientation: Right  ?Staging: Stage 2 -  Partial thickness loss of dermis presenting as a shallow open injury with a red, pink wound bed without slough.  ?Wound Description (Comments):   ?Present on Admission: Yes  ? ? ? ? ? ? ?Evan Morales ?  ?[facility] ?If 7PM-7AM, please contact night-coverage at [email or web address], ?Office  [phone] ? ? ?05/29/2021, 2:02 PM  LOS: 0 days  ? ? ? ? ? ? ? ? ? ? ?  ?

## 2021-05-29 NOTE — TOC Progression Note (Signed)
Transition of Care (TOC) - Progression Note  ? ? ?Patient Details  ?Name: Finnean Cerami ?MRN: [id number] ?Date of Birth: 08/18/1945 ? ?Transition of Care (TOC) CM/SW Contact  ?Purcell Mouton, RN ?Phone Number: ?05/29/2021, 1:49 PM ? ?Clinical Narrative:    ?Started insurance authorization for pt to return to [facility]  ? ? ?Expected Discharge Plan: [place] ?Barriers to Discharge: No Barriers Identified ? ?Expected Discharge Plan and Services ?Expected Discharge Plan: [place] ?  ?  ?Post Acute Care Choice: Home Health, [place] ?Living arrangements for the past 2 months: [place] ?                ?  ?  ?  ?  ?  ?  ?  ?  ?  ?  ? ? ?Social Determinants of Health (SDOH) Interventions ?  ? ?Readmission Risk Interventions ?   ? View : No data to display.  ?  ?  ?  ? ? ?

## 2021-05-30 DIAGNOSIS — E440 Moderate protein-calorie malnutrition: Secondary | ICD-10-CM

## 2021-05-30 DIAGNOSIS — Z978 Presence of other specified devices: Secondary | ICD-10-CM

## 2021-05-30 DIAGNOSIS — T83511A Infection and inflammatory reaction due to indwelling urethral catheter, initial encounter: Secondary | ICD-10-CM | POA: Diagnosis not present

## 2021-05-30 DIAGNOSIS — R531 Weakness: Secondary | ICD-10-CM | POA: Diagnosis not present

## 2021-05-30 DIAGNOSIS — G4710 Hypersomnia, unspecified: Secondary | ICD-10-CM | POA: Diagnosis not present

## 2021-05-30 DIAGNOSIS — N390 Urinary tract infection, site not specified: Secondary | ICD-10-CM

## 2021-05-30 LAB — URINE CULTURE: Culture: >=100000 — AB

## 2021-05-30 LAB — COMPREHENSIVE METABOLIC PANEL
ALT: 125 U/L — ABNORMAL HIGH (ref 0–44)
AST: 79 U/L — ABNORMAL HIGH (ref 15–41)
Albumin: 2.5 g/dL — ABNORMAL LOW (ref 3.5–5.0)
Alkaline Phosphatase: 58 U/L (ref 38–126)
Anion gap: 6 (ref 5–15)
BUN: 21 mg/dL (ref 8–23)
CO2: 20 mmol/L — ABNORMAL LOW (ref 22–32)
Calcium: 8.3 mg/dL — ABNORMAL LOW (ref 8.9–10.3)
Chloride: 112 mmol/L — ABNORMAL HIGH (ref 98–111)
Creatinine, Ser: 0.61 mg/dL (ref 0.61–1.24)
GFR, Estimated: >60 mL/min (ref >60)
Glucose, Bld: 93 mg/dL (ref 70–99)
Potassium: 3.4 mmol/L — ABNORMAL LOW (ref 3.5–5.1)
Sodium: 138 mmol/L (ref 135–145)
Total Bilirubin: 1.3 mg/dL — ABNORMAL HIGH (ref 0.3–1.2)
Total Protein: 6.1 g/dL — ABNORMAL LOW (ref 6.5–8.1)

## 2021-05-30 LAB — CBC
HCT: 35.9 % — ABNORMAL LOW (ref 39.0–52.0)
Hemoglobin: 11.9 g/dL — ABNORMAL LOW (ref 13.0–17.0)
MCH: 32.3 pg (ref 26.0–34.0)
MCHC: 33.1 g/dL (ref 30.0–36.0)
MCV: 97.6 fL (ref 80.0–100.0)
Platelets: 286 10*3/uL (ref 150–400)
RBC: 3.68 MIL/uL — ABNORMAL LOW (ref 4.22–5.81)
RDW: 12.8 % (ref 11.5–15.5)
WBC: 9.1 10*3/uL (ref 4.0–10.5)
nRBC: 0 % (ref 0.0–0.2)

## 2021-05-30 MED ORDER — ATORVASTATIN CALCIUM 10 MG PO TABS: 10.0000 mg | ORAL_TABLET | Freq: Every evening | ORAL | Status: AC

## 2021-05-30 MED ORDER — POTASSIUM CHLORIDE 20 MEQ PO PACK
40.0000 meq | PACK | Freq: Once | ORAL | Status: AC
  Administered 2021-05-30: 40 meq via ORAL
  Filled 2021-05-30: qty 2

## 2021-05-30 MED ORDER — POTASSIUM CHLORIDE 20 MEQ PO PACK: 20.0000 meq | PACK | Freq: Every day | ORAL | 0 refills | Status: AC

## 2021-05-30 NOTE — Hospital Course (Addendum)
76 y.o. male with past medical history of vascular dementia, hypertension, hyperlipidemia, gout, chronic Foley catheter presented to the hospital with increased somnolence.  Of note patient was recently admitted to the hospital between 05/10/2021 to 05/23/2021 for syncope, hypotension with blood pressure of 66/59, metabolic encephalopathy, hypotension, catheter associated UTI, dehydration, constipation.  At that time patient was given IV fluids for acute kidney injury and dehydration and ceftriaxone for UTI.  Foley catheter was exchanged and patient was sent back to the skilled facility.  At the facility patient was noted to be somnolent and was sent back to the hospital.  On this presentation patient was noted to be hypokalemic with hypocalcemia and chest x-ray showed possibility of pneumonia versus interstitial edema.  WBC was 10.3.  Patient was then admitted hospital for further evaluation and treatment. ? ?Assessment and Plan ? ?Principal Problem: ?  Hypersomnolence ?Active Problems: ?  Catheter-associated urinary tract infection ([place]) ?  Weakness ?  Chronic indwelling Foley catheter ?  Malnutrition of moderate degree ?  Hypokalemia ?  ?Hypersomnolence ?Has resolved at this time.  Patient is at his baseline.  Thought to be secondary to medication induced.  Melatonin has been discontinued.  Urinalysis was negative.  Chest x-ray showed possibility of pneumonia but procalcitonin was less than 0.10.  Patient has completed 5-day course of antibiotic was discharged on 05/27/2021.  CT head was negative.  Urine cultures showed Klebsiella pneumonia.  No plans to continue melatonin on discharge. ?  ?Catheter associated UTI ?Urine culture this time showing Klebsiella pneumoniae but patient is asymptomatic has chronic indwelling Foley catheter.  Likely colonization.  Recently treated with antibiotic. ? ?Elevated LFTs ?AST ALT elevated.  Ultrasound of the abdomen without any acute abnormality.  Lipitor on hold.  Could hold for  few days on discharge. ?  ?Hyperchloremic metabolic acidosis ?Received IV fluids with Ringer lactate. ? ?Malnutrition of moderate degree, present on admission.  Dietitian has been consulted.  Encourage oral intake. ?  ?Hypokalemia ?We will replenish orally.  Might need potassium supplements for next few days on discharge.  We will check BMP in AM. ?  ?Hypocalcemia ?Mild.  Has improved.  We will continue to monitor. ?  ?Hypertension ?On lisinopril as outpatient.  Will resume on discharge. ?  ?Elbow swelling/bursitis ?-Supportive care, no acute issues at this time. ? ?Weakness and debility.  Patient has been seen by physical therapy recommended skilled nursing facility placement on discharge. ?   ?

## 2021-05-30 NOTE — Progress Notes (Signed)
Pt placed in mittens to prevent pulling off/out  necessary medical equipment ie bandages, IV tubing/lines and foley catheter. ?

## 2021-05-30 NOTE — Progress Notes (Signed)
Report called to [place] at [facility]. Wife aware of transfer.  ?

## 2021-05-30 NOTE — TOC Progression Note (Signed)
Transition of Care (TOC) - Progression Note  ? ? ?Patient Details  ?Name: Evan Morales ?MRN: [id number] ?Date of Birth: Mar 24, 1945 ? ?Transition of Care (TOC) CM/SW Contact  ?Purcell Mouton, RN ?Phone Number: ?05/30/2021, 3:07 PM ? ?Clinical Narrative:    ? ?Spoke with pt's daughter in law and [facility], Therapist, sports at [employer] Palliative will see pt at [facility]. Daughter aware that pt will return to [facility].  ? ?Expected Discharge Plan: [place] ?Barriers to Discharge: No Barriers Identified ? ?Expected Discharge Plan and Services ?Expected Discharge Plan: [place] ?  ?  ?Post Acute Care Choice: Home Health, [place] ?Living arrangements for the past 2 months: [place] ?Expected Discharge Date: 05/30/21               ?  ?  ?  ?  ?  ?  ?  ?  ?  ?  ? ? ?Social Determinants of Health (SDOH) Interventions ?  ? ?Readmission Risk Interventions ?   ? View : No data to display.  ?  ?  ?  ? ? ?

## 2021-05-30 NOTE — Progress Notes (Signed)
?PROGRESS NOTE ? ? ? ?Evan Morales  MWU:[id number] DOB: [birth year] DOA: 05/28/2021 ?PCP: Ria Bush, MD  ? ? ?Brief Narrative:  ?76 y.o. male with past medical history of vascular dementia, hypertension, hyperlipidemia, gout, chronic Foley catheter presented to the hospital with increased somnolence.  Of note patient was recently admitted to the hospital between 05/10/2021 to 05/23/2021 for syncope, hypotension with blood pressure of 72/53, metabolic encephalopathy, hypotension, catheter associated UTI, dehydration, constipation.  At that time patient was given IV fluids for acute kidney injury and dehydration and ceftriaxone for UTI.  Foley catheter was exchanged and patient was sent back to the skilled facility.  At the facility patient was noted to be somnolent and was sent back to the hospital.  On this presentation patient was noted to be hypokalemic with hypocalcemia and chest x-ray showed possibility of pneumonia versus interstitial edema.  WBC was 10.3.  Patient was then admitted hospital for further evaluation and treatment. ? ?Assessment and Plan ? ?Principal Problem: ?  Hypersomnolence ?Active Problems: ?  Catheter-associated urinary tract infection ([place]) ?  Weakness ?  Chronic indwelling Foley catheter ?  Malnutrition of moderate degree ?  Hypokalemia ?  ?Hypersomnolence ?Has resolved at this time.  Patient is at his baseline.  Thought to be secondary to medication induced.  Melatonin has been discontinued.  Urinalysis was negative.  Chest x-ray showed possibility of pneumonia but procalcitonin was less than 0.10.  Patient has completed 5-day course of antibiotic was discharged on 05/27/2021.  CT head was negative.  Urine cultures showed Klebsiella pneumonia.  No plans to continue melatonin on discharge. ?  ?Catheter associated UTI ?Urine culture this time showing Klebsiella pneumoniae but patient is asymptomatic has chronic indwelling Foley catheter.  Likely colonization.  Recently treated with  antibiotic. ? ?Elevated LFTs ?AST ALT elevated.  Ultrasound of the abdomen without any acute abnormality.  Lipitor on hold.  Could hold for few days on discharge. ?  ?Hyperchloremic metabolic acidosis ?Received IV fluids with Ringer lactate. ? ?Malnutrition of moderate degree, present on admission.  Dietitian has been consulted.  Encourage oral intake. ?  ?Hypokalemia ?We will replenish orally.  Might need potassium supplements for next few days on discharge.  We will check BMP in AM. ?  ?Hypocalcemia ?Mild.  Has improved.  We will continue to monitor. ?  ?Hypertension ?On lisinopril as outpatient.  Will resume on discharge. ?  ?Elbow swelling/bursitis ?-Supportive care, no acute issues at this time. ? ?Weakness and debility.  Patient has been seen by physical therapy recommended skilled nursing facility placement on discharge. ?    ? ? DVT prophylaxis: enoxaparin (LOVENOX) injection 40 mg Start: 05/28/21 2000 ? ? ?Code Status:   ?  Code Status: DNR ? ?Disposition: Skilled nursing facility ?Status is: Inpatient ?Remains inpatient appropriate because: Need for skilled nursing facility ? ? Family Communication: Spoke with the patient at bedside ? ?Consultants:  ?None ? ?Procedures:  ?None ? ?Antimicrobials:  ?Rocephin IV ? ?Anti-infectives (From admission, onward)  ? ? Start     Dose/Rate Route Frequency Ordered Stop  ? 05/28/21 1745  cefTRIAXone (ROCEPHIN) 1 g in sodium chloride 0.9 % 100 mL IVPB       ? 1 g ?200 mL/hr over 30 Minutes Intravenous  Once 05/28/21 1733 05/28/21 1930  ? ?  ? ? ? ?Subjective: ?Today, patient was seen and examined at bedside.  Complains of right knee discomfort.  Slow speech but alert awake and communicative. ? ?Objective: ?Vitals:  ?  05/29/21 0345 05/29/21 0820 05/29/21 2109 05/30/21 0704  ?BP: (!) 109/58 118/80 (!) 148/86 (!) 143/85  ?Pulse: 64 75 74 72  ?Resp:  '18 20 20  '$ ?Temp: 99.2 ?F (37.3 ?C) 98.4 ?F (36.9 ?C) 98 ?F (36.7 ?C) 98 ?F (36.7 ?C)  ?TempSrc: Axillary Oral    ?SpO2: 96% 96%  99% 100%  ?Weight:      ?Height:      ? ? ?Intake/Output Summary (Last 24 hours) at 05/30/2021 1407 ?Last data filed at 05/30/2021 1036 ?Gross per 24 hour  ?Intake 630 ml  ?Output 1450 ml  ?Net -820 ml  ? ?Filed Weights  ? 05/28/21 1407  ?Weight: 89.5 kg  ? ? ?Physical Examination: ?General:  Average built, not in obvious distress ?HENT:   No scleral pallor or icterus noted. Oral mucosa is moist.  ?Chest:  Clear breath sounds.  Diminished breath sounds bilaterally. No crackles or wheezes.  ?CVS: S1 &S2 heard. No murmur.  Regular rate and rhythm. ?Abdomen: Soft, nontender, nondistended.  Bowel sounds are heard.   ?Extremities: No cyanosis, clubbing or edema.  Peripheral pulses are palpable.  Right knee with scar.  No effusion or redness. ?Psych: Alert, awake and Communicative, slow to respond, oriented x3. ?CNS:  No cranial nerve deficits.  Moves all extremities. ?Skin: Warm and dry.   ? ?Data Reviewed:  ? ?CBC: ?Recent Labs  ?Lab 05/28/21 ?1422 05/29/21 ?0422 05/30/21 ?0623  ?WBC 10.3 10.1 9.1  ?NEUTROABS 8.3*  --   --   ?HGB 13.6 12.0* 11.9*  ?HCT 40.5 36.4* 35.9*  ?MCV 97.6 97.1 97.6  ?PLT 359 300 286  ? ? ?Basic Metabolic Panel: ?Recent Labs  ?Lab 05/28/21 ?1500 05/29/21 ?0422 05/30/21 ?7628  ?NA 139 140 138  ?K 3.0* 4.0 3.4*  ?CL 117* 113* 112*  ?CO2 16* 20* 20*  ?GLUCOSE 82 94 93  ?BUN '17 21 21  '$ ?CREATININE 0.43* 0.68 0.61  ?CALCIUM 6.0* 8.6* 8.3*  ?MG 1.9  --   --   ? ? ?Liver Function Tests: ?Recent Labs  ?Lab 05/28/21 ?1500 05/29/21 ?0422 05/30/21 ?3151  ?AST 71* 79* 79*  ?ALT 101* 128* 125*  ?ALKPHOS 45 59 58  ?BILITOT 1.2 1.0 1.3*  ?PROT 5.3* 6.3* 6.1*  ?ALBUMIN 2.2* 2.6* 2.5*  ? ? ? ?Radiology Studies: ?DG Elbow Complete Left ? ?Result Date: 05/28/2021 ?CLINICAL DATA:  Elbow pain, swelling, erythema EXAM: LEFT ELBOW - COMPLETE 3+ VIEW COMPARISON:  None. FINDINGS: Frontal, bilateral oblique, lateral views are obtained. Evidence of prior healed proximal radial and ulnar diaphyseal fractures. No acute displaced  fractures. Alignment is anatomic. 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 There is no evidence of joint effusion. Extensive soft tissue swelling surrounds the entirety of the left elbow. IMPRESSION: 1. Diffuse soft tissue swelling. 2. Severe osteoarthritis. 3. No acute displaced fracture. Electronically Signed   By: Randa Ngo M.D.   On: 05/28/2021 15:42  ? ?CT Head Wo Contrast ? ?Result Date: 05/28/2021 ?CLINICAL DATA:  Delirium lethargy EXAM: CT HEAD WITHOUT CONTRAST TECHNIQUE: Contiguous axial images were obtained from the base of the skull through the vertex without intravenous contrast. RADIATION DOSE REDUCTION: This exam was performed according to the departmental dose-optimization program which includes automated exposure control, adjustment of the mA and/or kV according to patient size and/or use of iterative reconstruction technique. COMPARISON:  CT head 05/02/2021 FINDINGS: Brain: There is no acute intracranial hemorrhage, extra-axial  fluid collection, or acute infarct. Encephalomalacia in the left occipital lobe related to a prior infarct with associated ex vacuo dilatation of the left occipital horn are unchanged. Parenchymal volume is otherwise within normal limits. The ventricles are otherwise normal in size. Patchy hypodensity in the subcortical and periventricular white matter likely reflects sequela of chronic white matter microangiopathy. There is no mass lesion.  There is no mass effect or midline shift. Vascular: No hyperdense vessel or unexpected calcification. Skull: Normal. Negative for fracture or focal lesion. Sinuses/Orbits: The paranasal sinuses are clear. A left lens implant is noted. The globes and orbits are otherwise unremarkable. Other: None. IMPRESSION: 1. No acute intracranial pathology. 2. 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 Electronically Signed    By: Valetta Mole M.D.   On: 05/28/2021 16:07  ? ?US Abdomen Limited ? ?Result Date: 05/28/2021 ?CLINICAL DATA:  Elevated LFTs EXAM: ULTRASOUND ABDOMEN LIMITED RIGHT UPPER QUADRANT COMPARISON:  05/19/2021 F

## 2021-05-30 NOTE — Discharge Summary (Signed)
? ?Physician Discharge Summary  ?Evan Morales CLE:[id number] DOB: 1945/02/22 DOA: 05/28/2021 ? ?PCP: Ria Bush, MD ? ?Admit date: 05/28/2021 ?Discharge date: 05/30/2021 ? ?Admitted From: Skilled nursing facility ? ?Discharge disposition: Skilled nursing facility ? ? ?Recommendations for Outpatient Follow-Up:  ? ?Follow up with your primary care provider at the skilled nursing facility in 3 to 5 days for ?Check CBC, BMP, magnesium in the next visit ?Patient would benefit from palliative care services at the skilled nursing facility.  Please consider palliative care services as well  ? ?Discharge Diagnosis:  ? ?Principal Problem: ?  Hypersomnolence ?Active Problems: ?  Catheter-associated urinary tract infection ([place]) ?  Weakness ?  Chronic indwelling Foley catheter ?  Malnutrition of moderate degree ?  Hypokalemia ? ?Discharge Condition: Improved. ? ?Diet recommendation:   Regular. ? ?Wound care: None. ? ?Code status: DNR ? ?History of Present Illness:  ? ?76 y.o. male with past medical history of vascular dementia, hypertension, hyperlipidemia, gout, chronic Foley catheter presented to the hospital with increased somnolence.  Of note patient was recently admitted to the hospital between 05/10/2021 to 05/23/2021 for syncope, hypotension with blood pressure of 94/49, metabolic encephalopathy, hypotension, catheter associated UTI, dehydration, constipation.  At that time patient was given IV fluids for acute kidney injury and dehydration and ceftriaxone for UTI.  Foley catheter was exchanged and patient was sent back to the skilled facility.  At the facility patient was noted to be somnolent and was sent back to the hospital.  On this presentation patient was noted to be hypokalemic with hypocalcemia and chest x-ray showed possibility of pneumonia versus interstitial edema.  WBC was 10.3.  Patient was then admitted hospital for further evaluation and treatment. ? ? ?Hospital Course:  ? ?Following conditions were  addressed during hospitalization as listed below, ? ?Hypersomnolence ?Has resolved at this time.  Patient is at his baseline.  Thought to be secondary to medication induced.  Melatonin has been discontinued.  Urinalysis was negative.  Chest x-ray showed possibility of pneumonia but procalcitonin was less than 0.10.  Patient has completed 5-day course of antibiotic was discharged on 05/27/2021.  CT head was negative.  Urine cultures showed Klebsiella pneumonia.  No plans to continue melatonin on discharge. ?  ?Catheter associated UTI ?Urine culture this time showing Klebsiella pneumoniae but patient is asymptomatic and has chronic indwelling Foley catheter.  Likely colonization.  Recently treated with antibiotic. ?  ?Elevated LFTs ?AST ALT elevated.  Ultrasound of the abdomen without any acute abnormality.  Lipitor on hold.  Could hold for few days on discharge. ?  ?Hyperchloremic metabolic acidosis ?Received IV fluids with Ringer lactate. ?  ?Malnutrition of moderate degree, present on admission.  Dietitian has been consulted.  Encourage oral intake. ?  ?Hypokalemia ?We will replenish orally.  Might need potassium supplements for next few days on discharge.  ?  ?Hypocalcemia ?Mild.  Has improved.  We will continue to monitor. ?  ?Hypertension ?On lisinopril as outpatient.  Will resume on discharge. ?  ?Elbow swelling/bursitis ?-Supportive care, no acute issues at this time. ?  ?Weakness and debility.  Patient has been seen by physical therapy recommended skilled nursing facility placement on discharge. ?    ?Disposition.  At this time, patient is stable for disposition to skilled nursing facility.  Spoke with the patient's sister-in-law on the phone, she wishes palliative care services on discharge ? ?Medical Consultants:  ? ?None. ? ?Procedures: ?  ? ?None ?Subjective:  ? ?Today, patient was seen  and examined at bedside.  Appears to be at his baseline.  Complains of mild knee pain. ? ?Discharge Exam:  ? ?Vitals:  ?  05/30/21 0704 05/30/21 1414  ?BP: (!) 143/85 (!) 162/92  ?Pulse: 72 82  ?Resp: 20 18  ?Temp: 98 ?F (36.7 ?C) 98.6 ?F (37 ?C)  ?SpO2: 100% 99%  ? ?Vitals:  ? 05/29/21 0820 05/29/21 2109 05/30/21 0704 05/30/21 1414  ?BP: 118/80 (!) 148/86 (!) 143/85 (!) 162/92  ?Pulse: 75 74 72 82  ?Resp: '18 20 20 18  '$ ?Temp: 98.4 ?F (36.9 ?C) 98 ?F (36.7 ?C) 98 ?F (36.7 ?C) 98.6 ?F (37 ?C)  ?TempSrc: Oral   Oral  ?SpO2: 96% 99% 100% 99%  ?Weight:      ?Height:      ? ?General:  Average built, not in obvious distress ?HENT:   No scleral pallor or icterus noted. Oral mucosa is moist.  ?Chest:  Clear breath sounds.  Diminished breath sounds bilaterally. No crackles or wheezes.  ?CVS: S1 &S2 heard. No murmur.  Regular rate and rhythm. ?Abdomen: Soft, nontender, nondistended.  Bowel sounds are heard.   ?Extremities: No cyanosis, clubbing or edema.  Peripheral pulses are palpable.  Right knee with scar.  No effusion or redness. ?Psych: Alert, awake and communicative, slow to respond, oriented. ?CNS:  No cranial nerve deficits.  Moves all extremities. ?Skin: Warm and dry.   ? ?The results of significant diagnostics from this hospitalization (including imaging, microbiology, ancillary and laboratory) are listed below for reference.   ? ? ?Diagnostic Studies:  ? ?DG Elbow Complete Left ? ?Result Date: 05/28/2021 ?CLINICAL DATA:  Elbow pain, swelling, erythema EXAM: LEFT ELBOW - COMPLETE 3+ VIEW COMPARISON:  None. FINDINGS: Frontal, bilateral oblique, lateral views are obtained. Evidence of prior healed proximal radial and ulnar diaphyseal fractures. No acute displaced fractures. Alignment is anatomic. 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 There is no evidence of joint effusion. Extensive soft tissue swelling surrounds the entirety of the left elbow. IMPRESSION: 1. Diffuse soft tissue swelling. 2. Severe osteoarthritis. 3. No acute  displaced fracture. Electronically Signed   By: Randa Ngo M.D.   On: 05/28/2021 15:42  ? ?CT Head Wo Contrast ? ?Result Date: 05/28/2021 ?CLINICAL DATA:  Delirium lethargy EXAM: CT HEAD WITHOUT CONTRAST TECHNIQUE: Contiguous axial images were obtained from the base of the skull through the vertex without intravenous contrast. RADIATION DOSE REDUCTION: This exam was performed according to the departmental dose-optimization program which includes automated exposure control, adjustment of the mA and/or kV according to patient size and/or use of iterative reconstruction technique. COMPARISON:  CT head 05/02/2021 FINDINGS: Brain: There is no acute intracranial hemorrhage, extra-axial fluid collection, or acute infarct. Encephalomalacia in the left occipital lobe related to a prior infarct with associated ex vacuo dilatation of the left occipital horn are unchanged. Parenchymal volume is otherwise within normal limits. The ventricles are otherwise normal in size. Patchy hypodensity in the subcortical and periventricular white matter likely reflects sequela of chronic white matter microangiopathy. There is no mass lesion.  There is no mass effect or midline shift. Vascular: No hyperdense vessel or unexpected calcification. Skull: Normal. Negative for fracture or focal lesion. Sinuses/Orbits: The paranasal sinuses are clear. A left lens implant is noted. The globes and orbits are otherwise unremarkable. Other: None. IMPRESSION: 1. No acute intracranial pathology. 2. 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 Electronically Signed  By: Valetta Mole M.D.   On: 05/28/2021 16:07  ? ?US Abdomen Limited ? ?Result Date: 05/28/2021 ?CLINICAL DATA:  Elevated LFTs EXAM: ULTRASOUND ABDOMEN LIMITED RIGHT UPPER QUADRANT COMPARISON:  05/19/2021 FINDINGS: Gallbladder: Gallbladder is well distended without wall thickening or pericholecystic fluid. Negative sonographic Murphy's sign is noted. Common bile duct: Diameter: 2.8 mm.  Liver: No focal lesion identified. Within normal limits in parenchymal echogenicity. Portal vein is patent on color Doppler imaging with normal direction of blood flow towards the liver. Other: None.

## 2021-05-30 NOTE — Evaluation (Signed)
Physical Therapy Evaluation ?Patient Details ?Name: Evan Morales ?MRN: [id number] ?DOB: Jul 24, 1945 ?Today's Date: 05/30/2021 ? ?History of Present Illness ? 76 y.o. male, with history of vascular dementia, hypertension, hyperlipidemia, gout,, chronic Foley catheter who was recently admitted to the hospital from 05/10/2021 to 05/23/2021 for syncope, hypotension with blood pressure 51/76, metabolic encephalopathy, hypotension, catheter associated UTI, dehydration, constipation. Pt brought in from facility due to somnolence and found to have hypokalemia, hypocalcemia, chest x-ray showed possibility of pneumonia versus interstitial edema. ?  ?Clinical Impression ? [name] is 76 y.o. male recently discharged to SNF for rehab and readmitted for above HPI and diagnosis. Patient is currently limited by functional impairments below (see PT problem list). Currently pt is able to follow cues and more alert/aware. He continues to require extra time to initiate mobility and Max+2 for bed mobility and sit<>stand transfers. Pt is motivated and expresses a lot of gratitude to therapy for helping him mobilize. Patient will benefit from continued skilled PT interventions to address impairments and progress independence with mobility, recommending pt return to SNF for rehab. Acute PT will follow and progress as able.    ?   ? ?Recommendations for follow up therapy are one component of a multi-disciplinary discharge planning process, led by the attending physician.  Recommendations may be updated based on patient status, additional functional criteria and insurance authorization. ? ?Follow Up Recommendations Skilled nursing-short term rehab (<3 hours/day) ? ?  ?Assistance Recommended at Discharge Frequent or constant Supervision/Assistance  ?Patient can return home with the following ? Two people to help with walking and/or transfers;Two people to help with bathing/dressing/bathroom;Assistance with  cooking/housework;Assistance with feeding;Direct supervision/assist for medications management;Direct supervision/assist for financial management;Help with stairs or ramp for entrance;Assist for transportation ? ?  ?Equipment Recommendations BSC/3in1;Wheelchair (measurements PT);Wheelchair cushion (measurements PT);Hospital bed (if he goes home)  ?Recommendations for Other Services ? OT consult  ?  ?Functional Status Assessment Patient has had a recent decline in their functional status and/or demonstrates limited ability to make significant improvements in function in a reasonable and predictable amount of time  ? ?  ?Precautions / Restrictions Precautions ?Precautions: Fall ?Restrictions ?Weight Bearing Restrictions: No  ? ?  ? ?Mobility ? Bed Mobility ?Overal bed mobility: Needs Assistance ?Bed Mobility: Supine to Sit, Sit to Supine ?  ?  ?Supine to sit: Max assist, +2 for safety/equipment, +2 for physical assistance, HOB elevated ?Sit to supine: +2 for physical assistance, Total assist, +2 for safety/equipment ?  ?General bed mobility comments: Patient attempted to initiate bringing LE's off EOB but required Max Assist to bring both off EOB and Max assist to raise trunk up and pivot with bed pad. Once sitting assist provided to place hands on bed and steady balance. pt able to hold with min guard for safety. Max+2/Total assist to prevent ?  ? ?Transfers ?Overall transfer level: Needs assistance ?Equipment used: 2 person hand held assist, Ambulation equipment used ?Transfers: Sit to/from Stand ?Sit to Stand: Max assist, +2 physical assistance, +2 safety/equipment, From elevated surface ?  ?  ?  ?  ?  ?General transfer comment: Max+2 to power up from elevated EOB with 2HHA. pt required bed significantly elevated and pt's posture kyphotic with limited ability to achieve upright posture. Mod+2 to rise to [facility] but pt unable to achieve full upright position for Stedy paddles to fold down. ?Transfer via Lift  Equipment: Stedy ? ?Ambulation/Gait ?  ?  ?  ?  ?  ?  ?  ?  ? ?  Stairs ?  ?  ?  ?  ?  ? ?Wheelchair Mobility ?  ? ?Modified Rankin (Stroke Patients Only) ?  ? ?  ? ?Balance Overall balance assessment: Needs assistance ?Sitting-balance support: Feet supported, Bilateral upper extremity supported ?Sitting balance-Leahy Scale: Fair ?Sitting balance - Comments: pt required assist to stabilize balance at EOB initially and progressed to min guard for safety ?Postural control: Right lateral lean (at start but improved) ?Standing balance support: Bilateral upper extremity supported ?Standing balance-Leahy Scale: Zero ?Standing balance comment: Max+2 ?  ?  ?  ?  ?  ?  ?  ?  ?  ?  ?  ?   ? ? ? ?Pertinent Vitals/Pain Pain Assessment ?Pain Assessment: Faces ?Faces Pain Scale: Hurts little more ?Pain Location: Rt knee with mobility ?Pain Descriptors / Indicators: Discomfort ?Pain Intervention(s): Monitored during session, Limited activity within patient's tolerance, Repositioned  ? ? ?Home Living Family/patient expects to be discharged to:: Skilled nursing facility ?Living Arrangements: Spouse/significant other ?Available Help at Discharge: Family;Available PRN/intermittently ?Type of Home: House ?Home Access: Stairs to enter ?Entrance Stairs-Rails: None ?Entrance Stairs-Number of Steps: 3 ?  ?Home Layout: One level ?Home Equipment: Conservation officer, nature (2 wheels);Cane - single point ?Additional Comments: Doorways in house are not w/c accessible; Obtained from prior visit - pt unable to provide  ?  ?Prior Function Prior Level of Function : Needs assist ?  ?  ?  ?  ?  ?  ?Mobility Comments: Per admission on 4/9: Uses RW, slow pace, requiring increased assist in past weeks. 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 pt was admitted again 4/13-4/26 and discharged to SNF for rehab, returned on 05/28/21. ?ADLs Comments: Per admission on 4/9: Prior to last week, pt performing ADL's at slower pace, but has had a decline  and requiring assist. PT now needing assitance to feed himself and has a tendency to keep his eyes closed ?  ? ? ?Hand Dominance  ? Dominant Hand: Right ? ?  ?Extremity/Trunk Assessment  ? Upper Extremity Assessment ?Upper Extremity Assessment: Generalized weakness;Defer to OT evaluation ?  ? ?Lower Extremity Assessment ?Lower Extremity Assessment: Generalized weakness ?RLE Deficits / Details: grossly 2+/5 for less ?LLE Deficits / Details: grossly 2+/5 for less ?  ? ?Cervical / Trunk Assessment ?Cervical / Trunk Assessment: Kyphotic  ?Communication  ? Communication: No difficulties  ?Cognition Arousal/Alertness: Awake/alert ?Behavior During Therapy: Flat affect ?Overall Cognitive Status: No family/caregiver present to determine baseline cognitive functioning ?Area of Impairment: Memory, Following commands, Problem solving, Orientation, Attention, Safety/judgement, Awareness ?  ?  ?  ?  ?  ?  ?  ?  ?Orientation Level: Disoriented to, Situation ?Current Attention Level: Sustained ?Memory: Decreased short-term memory, Decreased recall of precautions ?Following Commands: Follows one step commands with increased time ?  ?Awareness: Emergent ?Problem Solving: Decreased initiation, Difficulty sequencing, Requires verbal cues, Requires tactile cues, Slow processing ?General Comments: pt drowsy with eyes closed but alerted to voices and more alert with mobility. pleasant and overall and more aware than pt was last admission. ?  ?  ? ?  ?General Comments   ? ?  ?Exercises    ? ?Assessment/Plan  ?  ?PT Assessment Patient needs continued PT services  ?PT Problem List Decreased strength;Decreased activity tolerance;Decreased balance;Decreased mobility;Decreased cognition;Decreased safety awareness;Decreased coordination;Decreased knowledge of use of DME ? ?   ?  ?PT Treatment Interventions DME instruction;Gait training;Functional mobility training;Therapeutic exercise;Therapeutic activities;Balance training;Patient/family  education;Cognitive remediation;Wheelchair mobility training   ? ?  PT Goals (Current goals can be found in the Care Plan section)  ?Acute Rehab PT Goals ?Patient Stated Goal: family agreeable for return to SNF ?PT Goal Formulation: With pati

## 2021-05-30 NOTE — Telephone Encounter (Signed)
Patient 's wife states she is returning call, could not find out who called ? ?However, she did say that her son (a paramedic) answered her question below in reference to whether Dr. Darnell Level. Could come to the hospital and see him or at [facility] place ? ?Wife states she will schedule hospital follow-up once the patient has been set up at [facility] place which should be occurring today  ?

## 2021-05-31 DIAGNOSIS — I10 Essential (primary) hypertension: Secondary | ICD-10-CM | POA: Diagnosis not present

## 2021-05-31 DIAGNOSIS — Z7982 Long term (current) use of aspirin: Secondary | ICD-10-CM | POA: Diagnosis not present

## 2021-05-31 DIAGNOSIS — M2550 Pain in unspecified joint: Secondary | ICD-10-CM | POA: Diagnosis not present

## 2021-05-31 DIAGNOSIS — Z7401 Bed confinement status: Secondary | ICD-10-CM | POA: Diagnosis not present

## 2021-05-31 DIAGNOSIS — R279 Unspecified lack of coordination: Secondary | ICD-10-CM | POA: Diagnosis not present

## 2021-05-31 DIAGNOSIS — R051 Acute cough: Secondary | ICD-10-CM | POA: Diagnosis not present

## 2021-05-31 DIAGNOSIS — N400 Enlarged prostate without lower urinary tract symptoms: Secondary | ICD-10-CM | POA: Diagnosis not present

## 2021-05-31 DIAGNOSIS — R41841 Cognitive communication deficit: Secondary | ICD-10-CM | POA: Diagnosis not present

## 2021-05-31 DIAGNOSIS — E876 Hypokalemia: Secondary | ICD-10-CM | POA: Diagnosis not present

## 2021-05-31 DIAGNOSIS — L89626 Pressure-induced deep tissue damage of left heel: Secondary | ICD-10-CM | POA: Diagnosis not present

## 2021-05-31 DIAGNOSIS — Z8673 Personal history of transient ischemic attack (TIA), and cerebral infarction without residual deficits: Secondary | ICD-10-CM | POA: Diagnosis not present

## 2021-05-31 DIAGNOSIS — Z79899 Other long term (current) drug therapy: Secondary | ICD-10-CM | POA: Diagnosis not present

## 2021-05-31 DIAGNOSIS — F0390 Unspecified dementia without behavioral disturbance: Secondary | ICD-10-CM | POA: Diagnosis not present

## 2021-05-31 DIAGNOSIS — R0789 Other chest pain: Secondary | ICD-10-CM | POA: Diagnosis not present

## 2021-05-31 DIAGNOSIS — R55 Syncope and collapse: Secondary | ICD-10-CM | POA: Diagnosis not present

## 2021-05-31 DIAGNOSIS — R079 Chest pain, unspecified: Secondary | ICD-10-CM | POA: Diagnosis not present

## 2021-05-31 DIAGNOSIS — R278 Other lack of coordination: Secondary | ICD-10-CM | POA: Diagnosis not present

## 2021-05-31 DIAGNOSIS — G9341 Metabolic encephalopathy: Secondary | ICD-10-CM | POA: Diagnosis not present

## 2021-05-31 DIAGNOSIS — R5383 Other fatigue: Secondary | ICD-10-CM | POA: Diagnosis not present

## 2021-05-31 DIAGNOSIS — J189 Pneumonia, unspecified organism: Secondary | ICD-10-CM | POA: Diagnosis not present

## 2021-05-31 DIAGNOSIS — G894 Chronic pain syndrome: Secondary | ICD-10-CM | POA: Diagnosis not present

## 2021-05-31 DIAGNOSIS — H4089 Other specified glaucoma: Secondary | ICD-10-CM | POA: Diagnosis not present

## 2021-05-31 DIAGNOSIS — I200 Unstable angina: Secondary | ICD-10-CM | POA: Diagnosis not present

## 2021-05-31 DIAGNOSIS — Z743 Need for continuous supervision: Secondary | ICD-10-CM | POA: Diagnosis not present

## 2021-05-31 DIAGNOSIS — R2689 Other abnormalities of gait and mobility: Secondary | ICD-10-CM | POA: Diagnosis not present

## 2021-05-31 DIAGNOSIS — F01B0 Vascular dementia, moderate, without behavioral disturbance, psychotic disturbance, mood disturbance, and anxiety: Secondary | ICD-10-CM | POA: Diagnosis not present

## 2021-05-31 DIAGNOSIS — K5909 Other constipation: Secondary | ICD-10-CM | POA: Diagnosis not present

## 2021-05-31 DIAGNOSIS — E440 Moderate protein-calorie malnutrition: Secondary | ICD-10-CM | POA: Diagnosis not present

## 2021-05-31 DIAGNOSIS — I959 Hypotension, unspecified: Secondary | ICD-10-CM | POA: Diagnosis not present

## 2021-05-31 DIAGNOSIS — E782 Mixed hyperlipidemia: Secondary | ICD-10-CM | POA: Diagnosis not present

## 2021-05-31 DIAGNOSIS — I201 Angina pectoris with documented spasm: Secondary | ICD-10-CM | POA: Diagnosis not present

## 2021-05-31 DIAGNOSIS — N390 Urinary tract infection, site not specified: Secondary | ICD-10-CM | POA: Diagnosis not present

## 2021-05-31 DIAGNOSIS — L89616 Pressure-induced deep tissue damage of right heel: Secondary | ICD-10-CM | POA: Diagnosis not present

## 2021-05-31 DIAGNOSIS — R4182 Altered mental status, unspecified: Secondary | ICD-10-CM | POA: Diagnosis not present

## 2021-05-31 DIAGNOSIS — M6281 Muscle weakness (generalized): Secondary | ICD-10-CM | POA: Diagnosis not present

## 2021-05-31 DIAGNOSIS — R1312 Dysphagia, oropharyngeal phase: Secondary | ICD-10-CM | POA: Diagnosis not present

## 2021-05-31 DIAGNOSIS — Z7902 Long term (current) use of antithrombotics/antiplatelets: Secondary | ICD-10-CM | POA: Diagnosis not present

## 2021-05-31 LAB — BASIC METABOLIC PANEL
Anion gap: 8 (ref 5–15)
BUN: 11 mg/dL (ref 8–23)
CO2: 20 mmol/L — ABNORMAL LOW (ref 22–32)
Calcium: 8.4 mg/dL — ABNORMAL LOW (ref 8.9–10.3)
Chloride: 107 mmol/L (ref 98–111)
Creatinine, Ser: 0.52 mg/dL — ABNORMAL LOW (ref 0.61–1.24)
GFR, Estimated: >60 mL/min (ref >60)
Glucose, Bld: 95 mg/dL (ref 70–99)
Potassium: 4.6 mmol/L (ref 3.5–5.1)
Sodium: 135 mmol/L (ref 135–145)

## 2021-05-31 NOTE — Progress Notes (Signed)
At this time, no updates or calls from [facility].  Patient remains here resting quietly. ?

## 2021-05-31 NOTE — Progress Notes (Signed)
CRN called [facility] concerning tranfer of this patient back to SNF [facility]).  [facility] stated that they would arrive around midnight to pick up patient. ? ?10:36pm CRN called [facility] to update them. ?

## 2021-05-31 NOTE — Telephone Encounter (Signed)
I'm sorry to hear this. ?I have been following along through hospital records.  ?There was mention of palliative care evaluation when at SNF which I think would be helpful - is this being set up?  ?

## 2021-05-31 NOTE — Progress Notes (Signed)
[facility] here to transport patient to [facility].  All patient belongings sent with patient.  Family was made aware that the patient was transferring.  ?

## 2021-06-01 ENCOUNTER — Emergency Department (HOSPITAL_COMMUNITY): Payer: PPO

## 2021-06-01 ENCOUNTER — Encounter (HOSPITAL_COMMUNITY): Payer: Self-pay

## 2021-06-01 ENCOUNTER — Emergency Department (HOSPITAL_COMMUNITY)
Admission: EM | Admit: 2021-06-01 | Discharge: 2021-06-02 | Disposition: A | Discharge status: Skilled Nursing Facility | Payer: PPO | Attending: Emergency Medicine | Admitting: Emergency Medicine

## 2021-06-01 ENCOUNTER — Other Ambulatory Visit: Payer: Self-pay

## 2021-06-01 DIAGNOSIS — I201 Angina pectoris with documented spasm: Secondary | ICD-10-CM | POA: Diagnosis not present

## 2021-06-01 DIAGNOSIS — I959 Hypotension, unspecified: Secondary | ICD-10-CM | POA: Diagnosis not present

## 2021-06-01 DIAGNOSIS — F01B0 Vascular dementia, moderate, without behavioral disturbance, psychotic disturbance, mood disturbance, and anxiety: Secondary | ICD-10-CM | POA: Diagnosis not present

## 2021-06-01 DIAGNOSIS — Z7982 Long term (current) use of aspirin: Secondary | ICD-10-CM | POA: Diagnosis not present

## 2021-06-01 DIAGNOSIS — R079 Chest pain, unspecified: Secondary | ICD-10-CM | POA: Diagnosis not present

## 2021-06-01 DIAGNOSIS — Z8673 Personal history of transient ischemic attack (TIA), and cerebral infarction without residual deficits: Secondary | ICD-10-CM | POA: Diagnosis not present

## 2021-06-01 DIAGNOSIS — N390 Urinary tract infection, site not specified: Secondary | ICD-10-CM | POA: Diagnosis not present

## 2021-06-01 DIAGNOSIS — F0390 Unspecified dementia without behavioral disturbance: Secondary | ICD-10-CM | POA: Insufficient documentation

## 2021-06-01 DIAGNOSIS — I10 Essential (primary) hypertension: Secondary | ICD-10-CM | POA: Diagnosis not present

## 2021-06-01 DIAGNOSIS — Z7902 Long term (current) use of antithrombotics/antiplatelets: Secondary | ICD-10-CM | POA: Diagnosis not present

## 2021-06-01 DIAGNOSIS — R55 Syncope and collapse: Secondary | ICD-10-CM | POA: Diagnosis not present

## 2021-06-01 DIAGNOSIS — R0789 Other chest pain: Secondary | ICD-10-CM | POA: Insufficient documentation

## 2021-06-01 DIAGNOSIS — Z79899 Other long term (current) drug therapy: Secondary | ICD-10-CM | POA: Insufficient documentation

## 2021-06-01 DIAGNOSIS — I200 Unstable angina: Secondary | ICD-10-CM | POA: Diagnosis not present

## 2021-06-01 LAB — COMPREHENSIVE METABOLIC PANEL
ALT: 106 U/L — ABNORMAL HIGH (ref 0–44)
AST: 58 U/L — ABNORMAL HIGH (ref 15–41)
Albumin: 2.4 g/dL — ABNORMAL LOW (ref 3.5–5.0)
Alkaline Phosphatase: 62 U/L (ref 38–126)
Anion gap: 8 (ref 5–15)
BUN: 16 mg/dL (ref 8–23)
CO2: 20 mmol/L — ABNORMAL LOW (ref 22–32)
Calcium: 8.4 mg/dL — ABNORMAL LOW (ref 8.9–10.3)
Chloride: 108 mmol/L (ref 98–111)
Creatinine, Ser: 0.86 mg/dL (ref 0.61–1.24)
GFR, Estimated: >60 mL/min (ref >60)
Glucose, Bld: 137 mg/dL — ABNORMAL HIGH (ref 70–99)
Potassium: 4.1 mmol/L (ref 3.5–5.1)
Sodium: 136 mmol/L (ref 135–145)
Total Bilirubin: 0.7 mg/dL (ref 0.3–1.2)
Total Protein: 6.1 g/dL — ABNORMAL LOW (ref 6.5–8.1)

## 2021-06-01 LAB — CBC WITH DIFFERENTIAL/PLATELET
Abs Immature Granulocytes: 0.04 10*3/uL (ref 0.00–0.07)
Basophils Absolute: 0 10*3/uL (ref 0.0–0.1)
Basophils Relative: 1 %
Eosinophils Absolute: 0.3 10*3/uL (ref 0.0–0.5)
Eosinophils Relative: 3 %
HCT: 38.4 % — ABNORMAL LOW (ref 39.0–52.0)
Hemoglobin: 13.1 g/dL (ref 13.0–17.0)
Immature Granulocytes: 1 %
Lymphocytes Relative: 14 %
Lymphs Abs: 1.1 10*3/uL (ref 0.7–4.0)
MCH: 32.6 pg (ref 26.0–34.0)
MCHC: 34.1 g/dL (ref 30.0–36.0)
MCV: 95.5 fL (ref 80.0–100.0)
Monocytes Absolute: 0.6 10*3/uL (ref 0.1–1.0)
Monocytes Relative: 7 %
Neutro Abs: 5.9 10*3/uL (ref 1.7–7.7)
Neutrophils Relative %: 74 %
Platelets: 376 10*3/uL (ref 150–400)
RBC: 4.02 MIL/uL — ABNORMAL LOW (ref 4.22–5.81)
RDW: 12.5 % (ref 11.5–15.5)
WBC: 7.9 10*3/uL (ref 4.0–10.5)
nRBC: 0 % (ref 0.0–0.2)

## 2021-06-01 LAB — TROPONIN I (HIGH SENSITIVITY)
Troponin I (High Sensitivity): 9 ng/L (ref <18)
Troponin I (High Sensitivity): 6 ng/L (ref <18)

## 2021-06-01 LAB — LACTIC ACID, PLASMA: Lactic Acid, Venous: 1.6 mmol/L (ref 0.5–1.9)

## 2021-06-01 LAB — CBG MONITORING, ED: Glucose-Capillary: 137 mg/dL — ABNORMAL HIGH (ref 70–99)

## 2021-06-01 MED ORDER — SODIUM CHLORIDE 0.9 % IV BOLUS
1000.0000 mL | Freq: Once | INTRAVENOUS | Status: AC
  Administered 2021-06-01: 1000 mL via INTRAVENOUS

## 2021-06-01 NOTE — Telephone Encounter (Signed)
Spoke to patient's wife and was advised that the person that she needs to talk to at [employer] is at lunch. ?Patient's wife stated that she will call back and let Dr. Darnell Level know what she finds out about palliative care evaluation. ?

## 2021-06-01 NOTE — Telephone Encounter (Signed)
Patient's wife notified as instructed by telephone and verbalized understanding. Patient's wife stated that something was mentioned to her about palliative care when he was leaving the hospital. Patient' wife stated that she has not heard anything about palliative care since getting to [facility]. Patient's wife stated that she is going to the nurses station now and find out the status.  Patient's wife stated that she has not seen any improvement since him getting to SNF.  ?Advised patient's wife that I will call her back to find out the status of palliative care evaluation. ?

## 2021-06-01 NOTE — ED Provider Notes (Signed)
?[place] ?Provider Note ? ? ?CSN: [id number] ?Arrival date & time: 06/01/21  1601 ? ?  ? ?History ? ?Chief Complaint  ?Patient presents with  ? Chest Pain  ? Hypotension  ? ? ?Evan Morales is a 76 y.o. male.  Presenting to ER due to concern for chest pain.  Patient is currently residing at [facility].  His wife reports that he had offered to get him ice cream which is a favorite food of his but he said he did not want the ice cream because he was experiencing chest discomfort.  Patient reports that the chest discomfort has resolved.  Patient has no medical complaints at this time. ? ?History is limited due to patient's dementia. ? ?Per review of chart -  past medical history of vascular dementia, CVA, hypertension, hyperlipidemia, gout.  Patient was admitted in March with concern for hypertension, encephalopathy, cauti.  Recently admitted for hypersomnolence. ? ?Wife at bedside states that patient is in the same clinical state, mental state as when he was discharged from the hospital a couple days ago.  She reports that he has had significant overall functional decline over the past couple months. ? ?Additional history obtained from EMS, they report that patient was initially hypotensive but this resolved after IVF.  Given aspirin for chest pain. ? ?HPI ? ?  ? ?Home Medications ?Prior to Admission medications   ?Medication Sig Start Date End Date Taking? Authorizing Provider  ?atorvastatin (LIPITOR) 10 MG tablet Take 1 tablet (10 mg total) by mouth every evening. 06/01/21   Pokhrel, Corrie Mckusick, MD  ?BIOFREEZE 4 % GEL Apply 1 application. topically See admin instructions. Apply to both knees 2 times a day    [provider]  ?brimonidine (ALPHAGAN) 0.2 % ophthalmic solution Place 1 drop into the right eye in the morning and at bedtime. 04/06/18   [provider]  ?celecoxib (CELEBREX) 200 MG capsule Take 1 capsule (200 mg total) by mouth 2 (two) times  daily. ?Patient not taking: Reported on 05/28/2021 03/14/21   Jessy Oto, MD  ?clopidogrel (PLAVIX) 75 MG tablet TAKE 1 TABLET BY MOUTH EVERY DAY ?Patient taking differently: Take 75 mg by mouth daily. 01/15/21   Ria Bush, MD  ?diclofenac Sodium (VOLTAREN) 1 % GEL Apply 4 g topically 4 (four) times daily. ?Patient not taking: Reported on 05/28/2021 03/14/21   Jessy Oto, MD  ?dorzolamide-timolol (COSOPT) 22.3-6.8 MG/ML ophthalmic solution Place 1 drop into the right eye in the morning and at bedtime. 02/21/16   [provider]  ?lisinopril (ZESTRIL) 20 MG tablet Take 20 mg by mouth daily.    [provider]  ?Multiple Vitamin (MULTIVITAMIN WITH MINERALS) TABS tablet Take 1 tablet by mouth daily. ?Patient not taking: Reported on 05/28/2021 05/23/21   Mercy Riding, MD  ?Multiple Vitamins-Minerals (SENIOR MULTIVITAMIN PLUS PO) Take 1 tablet by mouth daily with breakfast.    [provider]  ?polyethylene glycol powder (GAVILAX) 17 GM/SCOOP powder TAKE 1/2 TO A WHOLE CAPFUL MIXED IN 8 OZ FLUID AS NEEDED FOR CONSTIPATION, HOLD FOR DIARRHEA. ?Patient not taking: Reported on 05/28/2021 05/28/19   Ria Bush, MD  ?potassium chloride (KLOR-CON) 20 MEQ packet Take 20 mEq by mouth daily for 3 days. 05/30/21 06/02/21  Pokhrel, Corrie Mckusick, MD  ?psyllium (METAMUCIL SMOOTH TEXTURE) 58.6 % powder Take 1 packet by mouth daily. ?Patient not taking: Reported on 05/28/2021 02/26/19   Ria Bush, MD  ?senna-docusate (SENOKOT-S) 8.6-50 MG tablet Take  1 tablet by mouth 2 (two) times daily. 05/23/21   Mercy Riding, MD  ?SYSTANE ULTRA PF 0.4-0.3 % SOLN Place 1 drop into the left eye in the morning and at bedtime.    [provider]  ?tamsulosin (FLOMAX) 0.4 MG CAPS capsule Take 1 capsule (0.4 mg total) by mouth daily. 05/08/21   Sherwood Gambler, MD  ?   ? ?Allergies    ?Patient has no known allergies.   ? ?Review of Systems   ?Review of Systems  ?Unable to perform ROS: Dementia  ? ?Physical  Exam ?Updated Vital Signs ?BP (!) 157/89 (BP Location: Left Arm)   Pulse 72   Temp 97.8 ?F (36.6 ?C) (Oral)   Resp 18   Ht '5\' 11"'$  (1.803 m)   Wt 89 kg   SpO2 97%   BMI 27.37 kg/m?  ?Physical Exam ?Vitals and nursing note reviewed.  ?Constitutional:   ?   General: He is not in acute distress. ?   Comments: Elderly, frail appearing  ?HENT:  ?   Head: Normocephalic and atraumatic.  ?Eyes:  ?   Conjunctiva/sclera: Conjunctivae normal.  ?Cardiovascular:  ?   Rate and Rhythm: Normal rate and regular rhythm.  ?   Heart sounds: No murmur heard. ?Pulmonary:  ?   Effort: Pulmonary effort is normal. No respiratory distress.  ?   Breath sounds: Normal breath sounds.  ?Abdominal:  ?   Palpations: Abdomen is soft.  ?   Tenderness: There is no abdominal tenderness.  ?Musculoskeletal:     ?   General: No swelling.  ?   Cervical back: Neck supple.  ?Skin: ?   General: Skin is warm and dry.  ?   Capillary Refill: Capillary refill takes less than 2 seconds.  ?Neurological:  ?   Mental Status: He is alert.  ?   Comments: Patient is alert, answers basic questions, oriented to person and place, baseline per facility report and wife  ?Psychiatric:     ?   Mood and Affect: Mood normal.  ? ? ?ED Results / Procedures / Treatments   ?Labs ?(all labs ordered are listed, but only abnormal results are displayed) ?Labs Reviewed  ?CBC WITH DIFFERENTIAL/PLATELET - Abnormal; Notable for the following components:  ?    Result Value  ? RBC 4.02 (*)   ? HCT 38.4 (*)   ? All other components within normal limits  ?COMPREHENSIVE METABOLIC PANEL - Abnormal; Notable for the following components:  ? CO2 20 (*)   ? Glucose, Bld 137 (*)   ? Calcium 8.4 (*)   ? Total Protein 6.1 (*)   ? Albumin 2.4 (*)   ? AST 58 (*)   ? ALT 106 (*)   ? All other components within normal limits  ?CBG MONITORING, ED - Abnormal; Notable for the following components:  ? Glucose-Capillary 137 (*)   ? All other components within normal limits  ?LACTIC ACID, PLASMA  ?TROPONIN  I (HIGH SENSITIVITY)  ?TROPONIN I (HIGH SENSITIVITY)  ? ? ?EKG ?EKG Interpretation ? ?Date/Time:  Friday Jun 01 2021 16:16:14 EDT ?Ventricular Rate:  74 ?PR Interval:  208 ?QRS Duration: 103 ?QT Interval:  388 ?QTC Calculation: 431 ?R Axis:   44 ?Text Interpretation: Sinus rhythm Anterior infarct, old Confirmed by Madalyn Rob [phone]) on 06/01/2021 5:34:56 PM ? ?Radiology ?DG Chest Portable 1 View ? ?Result Date: 06/01/2021 ?CLINICAL DATA:  Chest pain EXAM: PORTABLE CHEST 1 VIEW COMPARISON:  05/28/2021, 05/19/2021 FINDINGS: The heart size and  mediastinal contours are within normal limits. Both lungs are clear. The visualized skeletal structures are unremarkable. Low lung volumes. IMPRESSION: No active disease. Electronically Signed   By: Donavan Foil M.D.   On: 06/01/2021 17:29   ? ?Procedures ?Procedures  ? ? ?Medications Ordered in ED ?Medications  ?sodium chloride 0.9 % bolus 1,000 mL (0 mLs Intravenous Stopped 06/01/21 2130)  ? ? ?ED Course/ Medical Decision Making/ A&P ?  ?                        ?Medical Decision Making ?Amount and/or Complexity of Data Reviewed ?Labs: ordered. ?Radiology: ordered. ? ? ?76 year old gentleman with extensive medical history including history of vascular dementia presented to ER due to concern for episode of chest pain.  EMS also had reported initial soft BP which resolved with some IVF.  He denied any chest pain upon arrival in ER and was observed for a few hours and had no recurrence of chest pain or discomfort.  His EKG does not demonstrate any acute ischemic change and his troponin is within normal limits x2.  Therefore doubt acute coronary syndrome.  His CXR does not demonstrate any pneumonia.  I independently reviewed myself.  Per wife, patient is at his baseline.  She does note significant overall decline over the last few months.  Completed extensive chart review and review of recent discharge summaries.  Patient did not have any documented low blood pressures while in ER  today.  He does not have any anemia, no significant electrolyte derangement.  Feel he is stable to go back to his skilled nursing facility at this time.  Advised follow-up with primary care doctor, discharged home. ? ? ?

## 2021-06-01 NOTE — ED Triage Notes (Addendum)
Pt arrives via EMS [facility]. Pt reports chest pain that started today. Pt denies sob. Pt is oriented to name, dob, and knows he is at the hospital. Pt unsure of date/time. This is his reported baseline. EMS states he initially had a systolic bp in the 75O, which improved after ivf. Ems administered '324mg'$  of asa.  ?

## 2021-06-01 NOTE — Discharge Instructions (Signed)
Follow-up with your regular doctor.  Come back to ER if you are having any recurrent chest pain or do any difficulty in breathing or other new concerning symptom. ?

## 2021-06-02 DIAGNOSIS — R5383 Other fatigue: Secondary | ICD-10-CM | POA: Diagnosis not present

## 2021-06-02 DIAGNOSIS — M2550 Pain in unspecified joint: Secondary | ICD-10-CM | POA: Diagnosis not present

## 2021-06-02 DIAGNOSIS — Z7401 Bed confinement status: Secondary | ICD-10-CM | POA: Diagnosis not present

## 2021-06-02 LAB — CULTURE, BLOOD (ROUTINE X 2)
Culture: NO GROWTH
Culture: NO GROWTH
Special Requests: ADEQUATE
Special Requests: ADEQUATE

## 2021-06-02 NOTE — ED Notes (Signed)
[place] made aware that patient will be coming back to facility. PTAR here to safely transport patient back to facility. All required paper work sent with patient prior to leaving department. Patient stable at time of discharge.  ?

## 2021-06-04 ENCOUNTER — Telehealth: Payer: Self-pay

## 2021-06-04 DIAGNOSIS — R55 Syncope and collapse: Secondary | ICD-10-CM | POA: Diagnosis not present

## 2021-06-04 DIAGNOSIS — Z8673 Personal history of transient ischemic attack (TIA), and cerebral infarction without residual deficits: Secondary | ICD-10-CM | POA: Diagnosis not present

## 2021-06-04 DIAGNOSIS — F01B0 Vascular dementia, moderate, without behavioral disturbance, psychotic disturbance, mood disturbance, and anxiety: Secondary | ICD-10-CM | POA: Diagnosis not present

## 2021-06-04 DIAGNOSIS — I10 Essential (primary) hypertension: Secondary | ICD-10-CM | POA: Diagnosis not present

## 2021-06-04 DIAGNOSIS — N390 Urinary tract infection, site not specified: Secondary | ICD-10-CM | POA: Diagnosis not present

## 2021-06-04 NOTE — Telephone Encounter (Signed)
Unable to reach pts wife at any contact #; sending note to Dr Darnell Level and Lattie Haw CMA. Per access nurse note pts wife was advised by access nurse since pt is resident at [facility] to take empty bottle to nurse at [facility]. ?

## 2021-06-04 NOTE — Telephone Encounter (Signed)
Needs to be a change made, when stand him up he can't move his feet ? ?Supposed to be helping him to do something other than eat or sleep ? ?Since May 02, 2021, he has been moved 8 times ? ?If he isn't going to get any better ? ?Hasn't spoken with Head of Evan Morales, she is back in the office today ? ?Evan Morales will go over to Evan Morales and find out about the palliative care evaluation and call us back ? ?Evan Morales really wants to speak directly with Evan Morales., I told her I would relay that message as well ?

## 2021-06-04 NOTE — Telephone Encounter (Signed)
[facility] - Client ?TELEPHONE ADVICE RECORD ?AccessNurse? ?Patient ?Name: ?Evan O'SH ?IELDS ?Gender: Male ?DOB: Oct 20, 1945 ?Age: 76 Y 74 M 4 D ?Return ?Phone ?Number: ?[phone] ?(Primary), ?[phone] ?(Secondary) ?Address: ?City/ ?State/ ?Zip: [name] ? [address] ?Client [facility] - Client ?Client Site [place] ?Provider Ria Bush - MD ?Contact Type Call ?Who Is Calling Patient / Member / Family / Caregiver ?Call Type Triage / Clinical ?Caller Name [name] ?Relationship To Patient Partner ?Return Phone Number [phone] (Primary) ?Chief Complaint Medication Question (non symptomatic) ?Reason for Call Symptomatic / Request for Health Information ?Initial Comment Caller states her husband had eye surgery a couple ?of months ago on his left eye, and he is suppose ?to have eye drops in the eye he had surgery on, ?but the only eye drops he has is for his right eye ?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 He is in [place] right ?now. ?Translation No ?Nurse Assessment ?Nurse: Loletha Carrow RN, Ronalee Belts Date/Time (Eastern Time): 06/02/2021 1:13:01 PM ?Confirm and document reason for call. If ?symptomatic, describe symptoms. ?---Caller is out of his Left eye drops. Systane Ultra dry ?eye relief eye gtt. Caller denies any new or worsening ?s/s. he is currently in a nursing home under their care. ?Caller was advised to take the empty bottle and let the ?nursing staff know that is part of his medication list. ?Does the patient have any new or worsening ?symptoms? ---No ?Please document clinical information provided and ?list any resource used. ---OTC eye gtt ?Disp. Time (Eastern ?Time) Disposition Final User ?06/02/2021 1:20:50 PM Clinical Call Yes Emch, RN, Mik ?

## 2021-06-05 DIAGNOSIS — L89616 Pressure-induced deep tissue damage of right heel: Secondary | ICD-10-CM | POA: Diagnosis not present

## 2021-06-05 DIAGNOSIS — L89626 Pressure-induced deep tissue damage of left heel: Secondary | ICD-10-CM | POA: Diagnosis not present

## 2021-06-05 NOTE — Telephone Encounter (Addendum)
Spoke with Evan Morales.  ?She has been unable to talk with [facility].  ?She doesn't think palliative care has been involved.  ?I asked her to reach out to facility MD for update on plan as well as to find out when therapy sessions (PT, OT etc) are planned so she can be present.  ?She will let me know what she finds out.  ?
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
## 2021-06-06 ENCOUNTER — Non-Acute Institutional Stay: Payer: Self-pay | Admitting: Student

## 2021-06-06 DIAGNOSIS — G894 Chronic pain syndrome: Secondary | ICD-10-CM | POA: Diagnosis not present

## 2021-06-06 DIAGNOSIS — R531 Weakness: Secondary | ICD-10-CM

## 2021-06-06 DIAGNOSIS — R55 Syncope and collapse: Secondary | ICD-10-CM | POA: Diagnosis not present

## 2021-06-06 DIAGNOSIS — N400 Enlarged prostate without lower urinary tract symptoms: Secondary | ICD-10-CM | POA: Diagnosis not present

## 2021-06-06 DIAGNOSIS — F01B0 Vascular dementia, moderate, without behavioral disturbance, psychotic disturbance, mood disturbance, and anxiety: Secondary | ICD-10-CM | POA: Diagnosis not present

## 2021-06-06 DIAGNOSIS — R051 Acute cough: Secondary | ICD-10-CM | POA: Diagnosis not present

## 2021-06-06 DIAGNOSIS — K5909 Other constipation: Secondary | ICD-10-CM | POA: Diagnosis not present

## 2021-06-06 DIAGNOSIS — H4089 Other specified glaucoma: Secondary | ICD-10-CM | POA: Diagnosis not present

## 2021-06-06 DIAGNOSIS — N390 Urinary tract infection, site not specified: Secondary | ICD-10-CM | POA: Diagnosis not present

## 2021-06-06 DIAGNOSIS — F0150 Vascular dementia without behavioral disturbance: Secondary | ICD-10-CM

## 2021-06-06 DIAGNOSIS — Z8673 Personal history of transient ischemic attack (TIA), and cerebral infarction without residual deficits: Secondary | ICD-10-CM | POA: Diagnosis not present

## 2021-06-06 DIAGNOSIS — E876 Hypokalemia: Secondary | ICD-10-CM | POA: Diagnosis not present

## 2021-06-06 DIAGNOSIS — Z515 Encounter for palliative care: Secondary | ICD-10-CM

## 2021-06-06 DIAGNOSIS — E782 Mixed hyperlipidemia: Secondary | ICD-10-CM | POA: Diagnosis not present

## 2021-06-06 DIAGNOSIS — I10 Essential (primary) hypertension: Secondary | ICD-10-CM | POA: Diagnosis not present

## 2021-06-06 DIAGNOSIS — E43 Unspecified severe protein-calorie malnutrition: Secondary | ICD-10-CM

## 2021-06-08 DIAGNOSIS — N390 Urinary tract infection, site not specified: Secondary | ICD-10-CM | POA: Diagnosis not present

## 2021-06-08 DIAGNOSIS — F01B0 Vascular dementia, moderate, without behavioral disturbance, psychotic disturbance, mood disturbance, and anxiety: Secondary | ICD-10-CM | POA: Diagnosis not present

## 2021-06-08 DIAGNOSIS — R55 Syncope and collapse: Secondary | ICD-10-CM | POA: Diagnosis not present

## 2021-06-08 DIAGNOSIS — I10 Essential (primary) hypertension: Secondary | ICD-10-CM | POA: Diagnosis not present

## 2021-06-08 DIAGNOSIS — Z8673 Personal history of transient ischemic attack (TIA), and cerebral infarction without residual deficits: Secondary | ICD-10-CM | POA: Diagnosis not present

## 2021-06-11 NOTE — Progress Notes (Signed)
? ? ?[facility] ?Community Palliative Care Consult Note ?Telephone: [phone]  ?Fax: [phone]  ? ?Date of encounter: 06/06/2021 ? ?PATIENT NAME: Evan Morales ?[place] [id number]   ?[phone] (home)  ?DOB: Mar 21, 1945 ?MRN: [id number] ?PRIMARY CARE PROVIDER:    ?Evan Bush, MD,  ?[address] [place] [address] ?[phone] ? ?REFERRING PROVIDER:   ?Evan Boehringer, NP ? ?RESPONSIBLE PARTY:    ?Contact Information   ? ? Name Relation Home Work Mobile  ? Evan Morales Spouse [phone]  [phone]  ? Evan Morales Son   [phone]  ? Evan Morales Daughter   [phone]  ? ?  ? ? ? ?I met face to face with patient and family in the facility. Palliative Care was asked to follow this patient by consultation request of  Evan Boehringer, NP to address advance care planning and complex medical decision making. This is the initial visit.  ? ? ?                                 ASSESSMENT AND PLAN / RECOMMENDATIONS:  ? ?Advance Care Planning/Goals of Care: Goals include to maximize quality of life and symptom management. Patient/health care surrogate gave his/her permission to discuss.Our advance care planning conversation included a discussion about:    ?The value and importance of advance care planning  ?Experiences with loved ones who have been seriously ill or have died  ?Exploration of personal, cultural or spiritual beliefs that might influence medical decisions  ?Exploration of goals of care in the event of a sudden injury or illness  ?Introduced MOST form; reviewed with wife ?CODE STATUS: DNR; completed and left at facility. ? ?Education provided on Palliative Medicine vs. Hospice services. Patient is currently receiving skilled therapy. Wife is in agreement with transitioning to hospice once skilled therapy ends. She asks about hospice home; explained that [facility] is designated for people who are usually in last two weeks of  life. Facility NP to make SW aware of LT plan after his skilled days ends and to discuss LT placement. Wife would like to limit hospitalizations; she is open to IV fluids. No HCPOA designated; wife also expresses some family strain.  ? ?Symptom Management/Plan: ? ?Vascular dementia-continue to reorient and redirect as needed. Monitor for swallowing difficulty; assist with feeding. Monitor for falls/safety. Continue Plavix as directed. Will refer for hospice evaluation once he completes skilled therapy.  ? ?Generalized weakness-staff to assist with all adl's. Continue therapy as tolerated.  ? ?Protein calorie malnutrition-patient continues with poor appetite. Continue to assist with feeding, offer foods patient enjoys, nutritional supplements. Current weight per facility 179.4 pounds. ? ?Follow up Palliative Care Visit: Palliative care will continue to follow for complex medical decision making, advance care planning, and clarification of goals. Return in 2-4 weeks or prn. ? ? ?This visit was coded based on medical decision making (MDM). ? ?PPS: 30% ? ?HOSPICE ELIGIBILITY/DIAGNOSIS: TBD ? ?Chief Complaint: Palliative Medicine initial consult.  ? ?HISTORY OF PRESENT ILLNESS:  Evan Morales is a 76 y.o. year old male  with Vascular dementia, CVA, hypertension, hyperlipidemia. Patient recently hospitalized 5/1-05/31/21 due to catheter associated UTI, malnutrition, hypokalemia, hypersomnolence. ED visit on 5/5 due to chest pain. ? ?Patient currently at [facility]. He is dependent for all adl's. He is receiving skilled therapy but fluctuates in his participation. He is sleeping more, poor appetite continues. He now has bilateral  heel wounds that facility is treating. Patient with indwelling foley catheter. Patient received resting in bed; wife and facility NP are also present. Patient is able to answer few direct questions. He is noted to be coughing after drinking liquids. He does report back pain during  visit; repositioned in bed.  ? ?History obtained from review of EMR, discussion with primary team, and interview with family, facility staff/caregiver and/or Evan Morales.  ?I reviewed available labs, medications, imaging, studies and related documents from the EMR.  Records reviewed and summarized above.  ? ?ROS ? ?Unable to contribute d/t dementia.  ? ?Physical Exam: ?Weight 179.4 pounds ?Constitutional: NAD ?General: frail appearing, ill appearing ?EYES: anicteric sclera, lids intact, no discharge  ?ENMT: intact hearing, oral mucous membranes dry, dentition intact ?CV: S1S2, RRR, no LE edema ?Pulmonary: lung sounds slightly diminished, no increased work of breathing,  cough, room air ?Abdomen: normo-active BS + 4 quadrants, soft and non tender, no ascites ?GU: foley catheter; tea colored urine ?MSK: +sarcopenia, moves all extremities ?Skin: warm and dry, no rashes, dressings to bilateral heels ?Neuro: + generalized weakness,  A & O to person, familiars ?Psych: non-anxious affect ?Hem/lymph/immuno: no widespread bruising ?CURRENT PROBLEM LIST:  ?Patient Active Problem List  ? Diagnosis Date Noted  ? Hypersomnolence 05/28/2021  ? Acute metabolic encephalopathy 84/53/6468  ? Catheter-associated urinary tract infection ([place]) 05/20/2021  ? Malnutrition of moderate degree 05/15/2021  ? Pressure injury of skin 05/11/2021  ? Weakness 05/11/2021  ? Vascular dementia ([place]) 05/11/2021  ? Chronic indwelling Foley catheter 05/11/2021  ? Syncope 05/10/2021  ? Hypokalemia 05/10/2021  ? Hyperbilirubinemia 05/10/2021  ? AKI (acute kidney injury) ([place]) 05/10/2021  ? Gross hematuria 05/10/2021  ? 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 Chronic pain of both shoulders 01/30/2016  ? Vitamin B12 deficiency 07/27/2015  ? Overweight with body mass index (BMI) 25.0-29.9 07/27/2015  ? 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 Advanced care planning/counseling discussion 07/26/2014  ? Health maintenance examination 07/26/2014  ? Dysthymia 07/26/2014  ? 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 Medicare annual wellness visit, subsequent 07/13/2012  ? Constipation 04/09/2012  ? Vision loss, bilateral 04/06/2012  ? Bilateral carotid artery stenosis 04/06/2012  ? History of CVA (cerebrovascular accident) 03/27/2012  ? Prediabetes 07/05/2010  ? ECZEMA 05/05/2007  ? Essential hypertension 05/13/2006  ? COLONIC POLYPS, ADENOMATOUS 05/15/2005  ? ?PAST MEDICAL HISTORY:  ?Active Ambulatory Problems  ?  Diagnosis Date Noted  ? COLONIC POLYPS, ADENOMATOUS 05/15/2005  ? Essential hypertension 05/13/2006  ? ECZEMA 05/05/2007  ? Prediabetes 07/05/2010  ? History of CVA (cerebrovascular accident) 03/27/2012  ? Constipation 04/09/2012  ? Medicare annual wellness visit, subsequent 07/13/2012  ? Vision loss, bilateral 04/06/2012  ? Bilateral carotid artery stenosis 04/06/2012  ? Advanced care planning/counseling discussion 07/26/2014  ? Health maintenance examination 07/26/2014  ? Dysthymia 07/26/2014  ? Vitamin B12 deficiency 07/27/2015  ? Overweight with body mass index (BMI) 25.0-29.9 07/27/2015  ? Chronic pain of both shoulders 01/30/2016  ? 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 Syncope 05/10/2021  ? Hypokalemia 05/10/2021  ? Hyperbilirubinemia 05/10/2021  ? AKI (acute kidney injury) ([place]) 05/10/2021  ?  Gross hematuria 05/10/2021  ? Pressure injury of skin 05/11/2021  ? Weakness 05/11/2021  ? Vascular dementia ([place]) 05/11/2021  ? Chronic indwelling Foley catheter 05/11/2021  ? Malnutrition of moderate degree 05/15/2021  ? Acute metabolic encephalopathy 46/80/3212  ?  Catheter-associated urinary tract infection ([place]) 05/20/2021  ? Hypersomnolence 05/28/2021  ? ?Resolved Ambulatory Problems  ?  Diagnosis Date Noted  ? HYPERCHOLESTEROLEMIA 05/04/2009  ? Medicare annual wellness visit, in

## 2021-06-12 DIAGNOSIS — L89626 Pressure-induced deep tissue damage of left heel: Secondary | ICD-10-CM | POA: Diagnosis not present

## 2021-06-12 DIAGNOSIS — L89616 Pressure-induced deep tissue damage of right heel: Secondary | ICD-10-CM | POA: Diagnosis not present

## 2021-06-13 NOTE — Telephone Encounter (Addendum)
Pts wife came in wanting to talk to Dr Darnell Level about pts situation. I advised her that Dr Darnell Level is with pts but she said she would like to talk to someone, I talked to [place] and she said she would let Lattie Haw know that pt is sitting out in the lobby so someone can talk to her ?

## 2021-06-13 NOTE — Telephone Encounter (Addendum)
I'm sorry he's struggling at SNF.  ?SNF purpose is to work on rehab to see if strength improves/pt heals and gets strong enough to come home, but sometimes this doesn't happen. If this is the case, an option may be transition to hospice once therapy ends, especially if he's no longer eating well - but hospice is usually reserved for last 6 months of life.  ?I'm glad palliative care is involved (first visit was 06/06/2021 according to chart, and they Phineas Real NP) spoke with wife), they can help guide treatment plan depending on how patient does.  ?

## 2021-06-13 NOTE — Telephone Encounter (Signed)
Mrs Grizzle came to office concerned about pts condition. Mrs Diebel said that pt has been at ED 5 times since Easter. Pt is presently at [facility]. Mrs Hauss usually stays with pt each day about 8 hrs. Mrs Johnella Moloney tries to feed pt. Mrs Rivet said that pt will eat small amts but usually will clamp his mouth down and not eat anything. Today pt would eat but would spit it right back out. Pt likes chocolate Boost but today pt would only drink about 1/2 of Boost. Pt is at [facility] for rehab but Mrs Alessandra Grout said they come in room for rehab and stand pt up and then repeat several times for pt to walk and pt just stands still and then they sit pt back down on side of bed and then put pt into bed.Mrs Guevarra is concerned that pts heels are raw and "look bad". Pt lays flat on his back all the time. The staff at [facility] do not turn the pt. Mrs Janusz is concerned what may happen to pt if things do not improve. Pt wants Dr Darnell Level to know what is going on and get his suggestion. I advised Mrs Davidian that I would send note to Dr Darnell Level and let LIsa CMA know and Mrs Alessandra Grout request cb. I advised Mrs Scoggin to talk with the doctor taking care of pt at [facility] place and voice her concerns and Mrs Hoey will have her son be with her when she talks with the doctor. Sending note to Dr Darnell Level and Lattie Haw CMA and I spoke with Lattie Haw as well. Mrs Dubuc can be reached either at home [phone] or cell [phone]. Mrs Leffel will keep Dr Darnell Level up to date on how pt is doing. UC & ED precautions is given and pt voiced understanding. ?

## 2021-06-13 NOTE — Telephone Encounter (Signed)
I am putting in the note from patient's wife mychart that was sent to [place] that is discussing patient so the message can be in the right note. ? ?"Evan Morales stopped by the office today to make an appt. with you and it went sideways.  Someone named Evan Morales or something like that talked to [place].  Evan Morales told her Evan Morales wasn't any better and she needed something done.  Evan Morales suggested she take Evan Morales to talk to the doctor at [facility] and find out what they were doing and why he wasn't better. ?Good Lord. ?When she got to my house and told me all of that I tried for the thousandth time to tell her he wasn't going to get better.  He is winding down and on and on.  Futile." ?

## 2021-06-28 NOTE — Telephone Encounter (Signed)
Called and spoke to patient's wife and was advised that he passed away this morning. Advised patient's wife we are so sorry for her loss. Patient's wife stated that she see's Dr. Damita Dunnings here at this office. Patient's wife was advised that I will let Dr. Damita Dunnings know that her husband passed away.

## 2021-06-28 NOTE — Telephone Encounter (Addendum)
Called to speak with wife to offer condolences, no answer. Will try again later.

## 2021-06-28 DEATH — deceased
